# Patient Record
Sex: Female | Born: 1945
Health system: Southern US, Community
[De-identification: ages and names within clinical notes are randomized; demographics above are authoritative.]

## PROBLEM LIST (undated history)

## (undated) DIAGNOSIS — K5790 Diverticulosis of intestine, part unspecified, without perforation or abscess without bleeding: Secondary | ICD-10-CM

## (undated) DIAGNOSIS — J45909 Unspecified asthma, uncomplicated: Secondary | ICD-10-CM

## (undated) DIAGNOSIS — I10 Essential (primary) hypertension: Secondary | ICD-10-CM

## (undated) DIAGNOSIS — K219 Gastro-esophageal reflux disease without esophagitis: Secondary | ICD-10-CM

## (undated) DIAGNOSIS — C50919 Malignant neoplasm of unspecified site of unspecified female breast: Secondary | ICD-10-CM

## (undated) DIAGNOSIS — R61 Generalized hyperhidrosis: Secondary | ICD-10-CM

## (undated) DIAGNOSIS — G4733 Obstructive sleep apnea (adult) (pediatric): Secondary | ICD-10-CM

## (undated) DIAGNOSIS — K642 Third degree hemorrhoids: Principal | ICD-10-CM

## (undated) DIAGNOSIS — K649 Unspecified hemorrhoids: Secondary | ICD-10-CM

## (undated) DIAGNOSIS — Z803 Family history of malignant neoplasm of breast: Secondary | ICD-10-CM

## (undated) HISTORY — DX: Obstructive sleep apnea (adult) (pediatric): G47.33

## (undated) HISTORY — DX: Third degree hemorrhoids: K64.2

## (undated) HISTORY — PX: NASAL SEPTUM SURGERY: SHX37

## (undated) HISTORY — DX: Family history of malignant neoplasm of breast: Z80.3

## (undated) HISTORY — DX: Malignant neoplasm of unspecified site of unspecified female breast: C50.919

## (undated) HISTORY — PX: THYROGLOSSAL DUCT CYST: SHX297

## (undated) HISTORY — DX: Gastro-esophageal reflux disease without esophagitis: K21.9

## (undated) HISTORY — DX: Diverticulosis of intestine, part unspecified, without perforation or abscess without bleeding: K57.90

## (undated) HISTORY — DX: Generalized hyperhidrosis: R61

## (undated) HISTORY — DX: Unspecified asthma, uncomplicated: J45.909

## (undated) HISTORY — PX: ABDOMINAL HYSTERECTOMY: SHX81

## (undated) HISTORY — DX: Unspecified hemorrhoids: K64.9

## (undated) HISTORY — DX: Essential (primary) hypertension: I10

---

## 1999-07-07 ENCOUNTER — Encounter: Payer: Self-pay | Admitting: Internal Medicine

## 1999-07-07 ENCOUNTER — Encounter: Admission: RE | Admit: 1999-07-07 | Discharge: 1999-07-07 | Payer: Self-pay | Admitting: Internal Medicine

## 2000-02-11 ENCOUNTER — Encounter: Payer: Self-pay | Admitting: Gynecology

## 2000-02-11 ENCOUNTER — Ambulatory Visit (HOSPITAL_COMMUNITY): Admission: RE | Admit: 2000-02-11 | Discharge: 2000-02-11 | Payer: Self-pay | Admitting: Gynecology

## 2000-09-08 ENCOUNTER — Other Ambulatory Visit: Admission: RE | Admit: 2000-09-08 | Discharge: 2000-09-08 | Payer: Self-pay | Admitting: Gynecology

## 2001-04-19 ENCOUNTER — Ambulatory Visit (HOSPITAL_COMMUNITY): Admission: RE | Admit: 2001-04-19 | Discharge: 2001-04-19 | Payer: Self-pay

## 2001-04-19 ENCOUNTER — Encounter: Payer: Self-pay | Admitting: Gynecology

## 2002-07-31 ENCOUNTER — Encounter: Payer: Self-pay | Admitting: Gynecology

## 2002-07-31 ENCOUNTER — Ambulatory Visit (HOSPITAL_COMMUNITY): Admission: RE | Admit: 2002-07-31 | Discharge: 2002-07-31 | Payer: Self-pay | Admitting: Gynecology

## 2002-09-17 ENCOUNTER — Encounter: Payer: Self-pay | Admitting: Internal Medicine

## 2002-09-17 ENCOUNTER — Encounter: Admission: RE | Admit: 2002-09-17 | Discharge: 2002-09-17 | Payer: Self-pay | Admitting: Internal Medicine

## 2003-08-06 ENCOUNTER — Ambulatory Visit (HOSPITAL_COMMUNITY): Admission: RE | Admit: 2003-08-06 | Discharge: 2003-08-06 | Payer: Self-pay | Admitting: Gynecology

## 2003-11-21 ENCOUNTER — Other Ambulatory Visit: Admission: RE | Admit: 2003-11-21 | Discharge: 2003-11-21 | Payer: Self-pay | Admitting: Gynecology

## 2004-08-11 ENCOUNTER — Ambulatory Visit (HOSPITAL_COMMUNITY): Admission: RE | Admit: 2004-08-11 | Discharge: 2004-08-11 | Payer: Self-pay | Admitting: Gynecology

## 2004-08-27 ENCOUNTER — Encounter: Admission: RE | Admit: 2004-08-27 | Discharge: 2004-08-27 | Payer: Self-pay | Admitting: Gynecology

## 2004-09-04 ENCOUNTER — Ambulatory Visit: Payer: Self-pay | Admitting: Internal Medicine

## 2004-09-09 ENCOUNTER — Ambulatory Visit: Payer: Self-pay | Admitting: Internal Medicine

## 2005-08-16 ENCOUNTER — Encounter: Admission: RE | Admit: 2005-08-16 | Discharge: 2005-08-16 | Payer: Self-pay | Admitting: Gynecology

## 2006-08-18 ENCOUNTER — Encounter: Admission: RE | Admit: 2006-08-18 | Discharge: 2006-08-18 | Payer: Self-pay | Admitting: Gynecology

## 2006-11-21 ENCOUNTER — Encounter: Payer: Self-pay | Admitting: Internal Medicine

## 2006-12-05 ENCOUNTER — Ambulatory Visit: Payer: Self-pay | Admitting: Internal Medicine

## 2006-12-14 ENCOUNTER — Encounter: Payer: Self-pay | Admitting: Internal Medicine

## 2006-12-15 ENCOUNTER — Ambulatory Visit: Payer: Self-pay | Admitting: Internal Medicine

## 2006-12-15 ENCOUNTER — Encounter: Payer: Self-pay | Admitting: Internal Medicine

## 2007-01-25 ENCOUNTER — Ambulatory Visit: Payer: Self-pay | Admitting: Internal Medicine

## 2007-01-25 DIAGNOSIS — K5730 Diverticulosis of large intestine without perforation or abscess without bleeding: Secondary | ICD-10-CM | POA: Insufficient documentation

## 2007-01-25 DIAGNOSIS — R1310 Dysphagia, unspecified: Secondary | ICD-10-CM

## 2007-01-27 ENCOUNTER — Encounter (INDEPENDENT_AMBULATORY_CARE_PROVIDER_SITE_OTHER): Payer: Self-pay | Admitting: *Deleted

## 2007-01-27 LAB — CONVERTED CEMR LAB
ALT: 28 units/L (ref 0–35)
Basophils Relative: 0.4 % (ref 0.0–1.0)
Bilirubin, Direct: 0.1 mg/dL (ref 0.0–0.3)
CO2: 30 meq/L (ref 19–32)
Eosinophils Relative: 2.4 % (ref 0.0–5.0)
GFR calc Af Amer: 130 mL/min
Glucose, Bld: 95 mg/dL (ref 70–99)
Hemoglobin: 13.3 g/dL (ref 12.0–15.0)
Lymphocytes Relative: 29.7 % (ref 12.0–46.0)
Monocytes Absolute: 0.4 10*3/uL (ref 0.2–0.7)
Neutro Abs: 2.3 10*3/uL (ref 1.4–7.7)
Neutrophils Relative %: 58.3 % (ref 43.0–77.0)
Potassium: 4 meq/L (ref 3.5–5.1)
Sodium: 143 meq/L (ref 135–145)
TSH: 1.62 microintl units/mL (ref 0.35–5.50)
Total Protein: 7 g/dL (ref 6.0–8.3)
VLDL: 21 mg/dL (ref 0–40)
WBC: 4 10*3/uL — ABNORMAL LOW (ref 4.5–10.5)

## 2007-07-18 ENCOUNTER — Ambulatory Visit: Payer: Self-pay | Admitting: Internal Medicine

## 2007-07-27 ENCOUNTER — Encounter (INDEPENDENT_AMBULATORY_CARE_PROVIDER_SITE_OTHER): Payer: Self-pay | Admitting: *Deleted

## 2007-08-23 ENCOUNTER — Ambulatory Visit (HOSPITAL_COMMUNITY): Admission: RE | Admit: 2007-08-23 | Discharge: 2007-08-23 | Payer: Self-pay | Admitting: Gynecology

## 2007-09-06 ENCOUNTER — Encounter: Admission: RE | Admit: 2007-09-06 | Discharge: 2007-09-06 | Payer: Self-pay | Admitting: Gynecology

## 2007-10-31 ENCOUNTER — Encounter (INDEPENDENT_AMBULATORY_CARE_PROVIDER_SITE_OTHER): Payer: Self-pay | Admitting: Radiology

## 2007-10-31 ENCOUNTER — Encounter: Admission: RE | Admit: 2007-10-31 | Discharge: 2007-10-31 | Payer: Self-pay | Admitting: Gynecology

## 2008-09-03 ENCOUNTER — Encounter: Admission: RE | Admit: 2008-09-03 | Discharge: 2008-09-03 | Payer: Self-pay | Admitting: Gynecology

## 2008-09-25 ENCOUNTER — Telehealth (INDEPENDENT_AMBULATORY_CARE_PROVIDER_SITE_OTHER): Payer: Self-pay | Admitting: *Deleted

## 2008-09-25 ENCOUNTER — Ambulatory Visit: Payer: Self-pay | Admitting: Internal Medicine

## 2009-04-30 ENCOUNTER — Telehealth: Payer: Self-pay | Admitting: Internal Medicine

## 2009-09-23 ENCOUNTER — Encounter: Admission: RE | Admit: 2009-09-23 | Discharge: 2009-09-23 | Payer: Self-pay | Admitting: Gynecology

## 2010-01-14 ENCOUNTER — Ambulatory Visit: Payer: Self-pay | Admitting: Internal Medicine

## 2010-01-14 DIAGNOSIS — R198 Other specified symptoms and signs involving the digestive system and abdomen: Secondary | ICD-10-CM

## 2010-01-14 DIAGNOSIS — K3189 Other diseases of stomach and duodenum: Secondary | ICD-10-CM

## 2010-01-14 DIAGNOSIS — R1013 Epigastric pain: Secondary | ICD-10-CM

## 2010-01-14 DIAGNOSIS — N959 Unspecified menopausal and perimenopausal disorder: Secondary | ICD-10-CM | POA: Insufficient documentation

## 2010-01-14 DIAGNOSIS — Z860100 Personal history of colon polyps, unspecified: Secondary | ICD-10-CM | POA: Insufficient documentation

## 2010-01-15 LAB — CONVERTED CEMR LAB
Albumin: 4.2 g/dL (ref 3.5–5.2)
Basophils Relative: 1.3 % (ref 0.0–3.0)
CO2: 28 meq/L (ref 19–32)
Chloride: 107 meq/L (ref 96–112)
Cholesterol: 216 mg/dL — ABNORMAL HIGH (ref 0–200)
Creatinine, Ser: 0.6 mg/dL (ref 0.4–1.2)
Direct LDL: 142.9 mg/dL
Eosinophils Absolute: 0.1 10*3/uL (ref 0.0–0.7)
Hemoglobin: 13.4 g/dL (ref 12.0–15.0)
Lymphs Abs: 1.2 10*3/uL (ref 0.7–4.0)
MCHC: 34 g/dL (ref 30.0–36.0)
MCV: 95 fL (ref 78.0–100.0)
Monocytes Absolute: 0.3 10*3/uL (ref 0.1–1.0)
Neutro Abs: 3.1 10*3/uL (ref 1.4–7.7)
Neutrophils Relative %: 65.3 % (ref 43.0–77.0)
RBC: 4.14 M/uL (ref 3.87–5.11)
TSH: 1.3 microintl units/mL (ref 0.35–5.50)
Total CHOL/HDL Ratio: 3
Total Protein: 7.1 g/dL (ref 6.0–8.3)

## 2010-01-20 ENCOUNTER — Encounter: Payer: Self-pay | Admitting: Internal Medicine

## 2010-02-09 ENCOUNTER — Encounter (INDEPENDENT_AMBULATORY_CARE_PROVIDER_SITE_OTHER): Payer: Self-pay | Admitting: *Deleted

## 2010-02-09 ENCOUNTER — Ambulatory Visit: Payer: Self-pay | Admitting: Internal Medicine

## 2010-02-09 LAB — CONVERTED CEMR LAB: OCCULT 1: NEGATIVE

## 2010-04-09 ENCOUNTER — Telehealth: Payer: Self-pay | Admitting: Internal Medicine

## 2010-05-24 ENCOUNTER — Encounter: Payer: Self-pay | Admitting: Gynecology

## 2010-06-04 NOTE — Letter (Signed)
Summary: Cancer Screening/[facility] Personalized Risk Profile  Cancer Screening/[facility] Personalized Risk Profile   Imported By: Lanelle Bal 01/22/2010 09:36:13  _____________________________________________________________________  External Attachment:    Type:   Image     Comment:   External Document

## 2010-06-04 NOTE — Assessment & Plan Note (Signed)
Summary: cpx - lab/cbs   Vital Signs:  Patient profile:   65 year old female Height:      57.5 inches Weight:      118.6 pounds BMI:     25.31 Temp:     98.3 degrees F oral Pulse rate:   71 / minute Resp:     14 per minute BP sitting:   130 / 85  (left arm) Cuff size:   large  Vitals Entered By: Shonna Chock CMA (January 14, 2010 8:36 AM)   History of Present Illness: Courtney [name] is here for a Medicare AMV.She describes  dyspepsia & loose stool in mornings as  itermittent issues over past 2 months.  As needed  Prevacid controls the dyspepsia. Symptoms worse when traveling.                            1.Risk factors based on Past M, S, F history:see symptoms above ; chart updated 2.Physical Activities: see Social history 3.Depression/mood: no issues 4.Hearing: whisper heard @ 6 ft 5.ADL's: no limitations 6.Fall Risk: no issues  7.Home Safety: no issues 8.Height, weight, &visual acuity:wall chart read @ 6 ft( OD lens worn) 9.Counseling: none requested ; POA in place  10.Labs ordered based on risk factors: see Orders 11. Referral Coordination: none requested  12.  Care Plan: see Instructions  13. Cognitive Assessment: oreinted X 3 ; memory & recall intact   ; "WORLD" spelled backwards ; mood & affect normal.   Preventive Screening-Counseling & Management  Alcohol-Tobacco     Alcohol drinks/day: 2-3     Alcohol Counseling: not indicated; use of alcohol is not excessive or problematic     Smoking Status: quit     Year Quit: 1968  Caffeine-Diet-Exercise     Caffeine use/day: < 1/ day     Does Patient Exercise: yes  Hep-HIV-STD-Contraception     Dental Visit-last 6 months yes     Sun Exposure-Excessive: no  Safety-Violence-Falls     Seat Belt Use: yes     Firearms in the Home: firearms in the home     Firearm Counseling: not indicated; uses recommended firearm safety measures      Sexual History:  currently monogamous.        Blood Transfusions:  no.        Travel  History:  none in past 12 mos.    Current Medications (verified): 1)  Finacea 15 %  Gel (Azelaic Acid) .... Apply As Directed 2)  Premarin 0.45 Mg  Tabs (Estrogens Conjugated) .Marland Kitchen.. 1 By Mouth Qd 3)  Calcium 500mg  .... 1 By Mouth Qd 4)  Multivitamin .... 2 Tabs Qd 5)  Omega 3 6)  Euthero 7)  Gingko 8)  Horse Chestnut 9)  Vit B6 10)  Vit D 11)  Coq10 12)  Asa 81mg   Allergies (verified): No Known Drug Allergies  Past History:  Past Medical History: G 2 P2 Diverticulosis, colon NMR Lipoprofile 2009: LDL 109 (1239/424), HDL 65, TG 64. LDL ideal goal = < 120. Colonic polyps, hx of 2008  Past Surgical History: Colon polypectomy 2008, Dr Juanda Chance, due 2013 Hysterectomy & BSO 1995 for fibroids Septoplasty 1976; thyroglossal duct cystectomy  Family History: Father: CAD,diet controlled carbohydrate intolerance Mother: HTN, cancer  breast & colon Siblings: sister malignancy of  appendix; P uncles ? premature CAD  Social History: no specific  diet Retired Married Former Smoker Alcohol use-yes: socially Regular exercise-yes: water aerobics  3-4X/week, walking 3 mpd 3X/ week Caffeine use/day:  < 1/ day Dental Care w/in 6 mos.:  yes Sun Exposure-Excessive:  no Seat Belt Use:  yes Sexual History:  currently monogamous Blood Transfusions:  no  Review of Systems  The patient denies anorexia, weight loss, weight gain, vision loss, decreased hearing, hoarseness, chest pain, syncope, dyspnea on exertion, peripheral edema, prolonged cough, headaches, abdominal pain, melena, hematochezia, hematuria, suspicious skin lesions, depression, unusual weight change, abnormal bleeding, enlarged lymph nodes, and angioedema.         Intermittent pill (vitamins) & food ("dry" ) dysphagia.  Physical Exam  General:  well-nourished; alert,appropriate and cooperative throughout examination Head:  Normocephalic and atraumatic without obvious abnormalities. Eyes:  No corneal or conjunctival  inflammation noted. Perrla. Funduscopic exam benign, without hemorrhages, exudates or papilledema. No icterus Ears:  External ear exam shows no significant lesions or deformities.  Otoscopic examination reveals clear canals, tympanic membranes are intact bilaterally without bulging, retraction, inflammation or discharge. Hearing is grossly normal bilaterally. Nose:  External nasal examination shows no deformity or inflammation. Nasal mucosa are pink and moist without lesions or exudates. Mouth:  Oral mucosa and oropharynx without lesions or exudates.  Teeth in good repair. No pharyngeal erythema.   Neck:  No deformities, masses, or tenderness noted. Lungs:  Normal respiratory effort, chest expands symmetrically. Lungs are clear to auscultation, no crackles or wheezes. Heart:  normal rate, regular rhythm, no gallop, no rub, no JVD, no HJR, and grade 1/2  /6 systolic murmur.   Abdomen:  Bowel sounds positive,abdomen soft and non-tender without masses, organomegaly or hernias noted. Genitalia:  Dr Greta Doom Msk:  No deformity or scoliosis noted of thoracic or lumbar spine.   Pulses:  R and L carotid,radial,dorsalis pedis and posterior tibial pulses are full and equal bilaterally Extremities:  No clubbing, cyanosis, edema, or deformity noted with normal full range of motion of all joints.   Neurologic:  alert & oriented X3 and DTRs symmetrical and normal.   Skin:  Intact without suspicious lesions or rashes Cervical Nodes:  No lymphadenopathy noted Axillary Nodes:  No palpable lymphadenopathy Psych:  memory intact for recent and remote, normally interactive, and good eye contact.     Impression & Recommendations:  Problem # 1:  PREVENTIVE HEALTH CARE (ICD-V70.0)  Orders: [facility] -Subsequent Annual Wellness Visit [phone]) Venipuncture (03474) TLB-Lipid Panel (80061-LIPID) TLB-BMP (Basic Metabolic Panel-BMET) (80048-METABOL) Specimen Handling (25956)  Problem # 2:  DYSPEPSIA (ICD-536.8)  Orders: EKG  w/ Interpretation (93000) Venipuncture (38756) TLB-CBC Platelet - w/Differential (85025-CBCD) Specimen Handling (43329)  Problem # 3:  CHANGE IN BOWELS (ICD-787.99)  Orders: Venipuncture (51884) TLB-Hepatic/Liver Function Pnl (80076-HEPATIC) TLB-TSH (Thyroid Stimulating Hormone) (84443-TSH) Specimen Handling (16606)  Problem # 4:  COLONIC POLYPS, HX OF (ICD-V12.72)  as per Dr Juanda Chance  Orders: Venipuncture (30160) TLB-CBC Platelet - w/Differential (85025-CBCD) Specimen Handling (10932)  Problem # 5:  POSTMENOPAUSAL SYNDROME (ICD-627.9)  Her updated medication list for this problem includes:    Premarin 0.45 Mg Tabs (Estrogens conjugated) .Marland Kitchen... 1 by mouth qd  Orders: Radiology Referral (Radiology)  Complete Medication List: 1)  Finacea 15 % Gel (Azelaic acid) .... Apply as directed 2)  Premarin 0.45 Mg Tabs (Estrogens conjugated) .Marland Kitchen.. 1 by mouth qd 3)  Calcium 500mg   .... 1 by mouth qd 4)  Multivitamin  .... 2 tabs qd 5)  Omega 3  6)  Euthero  7)  Gingko  8)  Horse Chestnut  9)  Vit B6  10)  Vit D  11)  Coq10  12)  Asa 81mg   13)  Ranitidine Hcl 150 Mg Tabs (Ranitidine hcl) .Marland Kitchen.. 1 two times a day pre meals  Other Orders: Zoster (Shingles) Vaccine Live [phone]) Admin 1st Vaccine (98119)  Patient Instructions: 1)  Avoid foods high in acid (tomatoes, citrus juices, spicy foods). Avoid eating within two hours of lying down or before exercising. Do not over eat; try smaller more frequent meals. Elevate head of bed twelve inches when sleeping.Complete stool cards. Take Align once daily for loose stools. 2)  Prepare a Living Will if not place . Prescriptions: RANITIDINE HCL 150 MG TABS (RANITIDINE HCL) 1 two times a day pre meals  #180 x 0   Entered and Authorized by:   [name] MD   Signed by:   [name] MD on 01/14/2010   Method used:   Print then Give to Patient   RxID:   [id number]    Immunizations Administered:  Zostavax # 1:    Vaccine Type:  Zostavax    Site: Right Arm    Mfr: Merck    Dose: 0.28mL    Route: [place]    Given by: Shonna Chock CMA    Exp. Date: 12/19/2010    Lot #: [id number]    VIS given: 02/12/05 given January 14, 2010.

## 2010-06-04 NOTE — Progress Notes (Signed)
Summary: Ranitidine  Phone Note Call from Patient Call back at Home Phone [phone]   Summary of Call: Patient left message on triage that she has been trying the Ranitidine and it is not working. She would like to know if there is something else she can try or does she need to come in. Please advise. Initial call taken by: Lucious Groves CMA,  April 09, 2010 4:03 PM  Follow-up for Phone Call        Per MD the patient can try:  Omeprazole 20mg  #60 1 by mouth two times a day before breakfast and evening meal. Follow-up by: Lucious Groves CMA,  April 09, 2010 4:18 PM    New/Updated Medications: OMEPRAZOLE 20 MG CPDR (OMEPRAZOLE) 1 by mouth two times a day before breakfast and evening meal Prescriptions: OMEPRAZOLE 20 MG CPDR (OMEPRAZOLE) 1 by mouth two times a day before breakfast and evening meal  #60 x 0   Entered by:   Lucious Groves CMA   Authorized by:   [name] MD   Signed by:   Lucious Groves CMA on 04/09/2010   Method used:   Electronically to        [id number] (retail)       [address]       [place], [place]  [address]       Ph: [phone]       Fax: [phone]   RxID:   [id number]

## 2010-06-04 NOTE — Letter (Signed)
Summary: Results Follow up Letter  [facility] [place]  [address] [place], [place] [address]   Phone: [phone]  Fax: [phone]    02/09/2010 MRN: [id number]  [facility] [address] [place], [place]  [address]  Dear Courtney [name],  The following are the results of your recent test(s):  Test         Result    Pap Smear:        Normal _____  Not Normal _____ Comments: ______________________________________________________ Cholesterol: LDL(Bad cholesterol):         Your goal is less than:         HDL (Good cholesterol):       Your goal is more than: Comments:  ______________________________________________________ Mammogram:        Normal _____  Not Normal _____ Comments:  ___________________________________________________________________ Hemoccult:        Normal _X____  Not normal _______ Comments:    _____________________________________________________________________ Other Tests:    We routinely do not discuss normal results over the telephone.  If you desire a copy of the results, or you have any questions about this information we can discuss them at your next office visit.   Sincerely,

## 2010-10-21 ENCOUNTER — Other Ambulatory Visit: Payer: Self-pay | Admitting: Gynecology

## 2010-10-21 DIAGNOSIS — Z1231 Encounter for screening mammogram for malignant neoplasm of breast: Secondary | ICD-10-CM

## 2010-11-01 DIAGNOSIS — C50919 Malignant neoplasm of unspecified site of unspecified female breast: Secondary | ICD-10-CM

## 2010-11-01 HISTORY — DX: Malignant neoplasm of unspecified site of unspecified female breast: C50.919

## 2010-11-02 ENCOUNTER — Ambulatory Visit
Admission: RE | Admit: 2010-11-02 | Discharge: 2010-11-02 | Disposition: A | Discharge status: Home / Self Care | Payer: Medicare Other | Source: Ambulatory Visit | Attending: Gynecology | Admitting: Gynecology

## 2010-11-02 DIAGNOSIS — Z1231 Encounter for screening mammogram for malignant neoplasm of breast: Secondary | ICD-10-CM

## 2010-11-06 ENCOUNTER — Other Ambulatory Visit: Payer: Self-pay | Admitting: Gynecology

## 2010-11-06 DIAGNOSIS — R928 Other abnormal and inconclusive findings on diagnostic imaging of breast: Secondary | ICD-10-CM

## 2010-11-11 ENCOUNTER — Other Ambulatory Visit: Payer: Self-pay | Admitting: Gynecology

## 2010-11-11 ENCOUNTER — Ambulatory Visit
Admission: RE | Admit: 2010-11-11 | Discharge: 2010-11-11 | Disposition: A | Discharge status: Home / Self Care | Payer: Medicare Other | Source: Ambulatory Visit | Attending: Gynecology | Admitting: Gynecology

## 2010-11-11 ENCOUNTER — Other Ambulatory Visit: Payer: Self-pay | Admitting: Diagnostic Radiology

## 2010-11-11 DIAGNOSIS — R928 Other abnormal and inconclusive findings on diagnostic imaging of breast: Secondary | ICD-10-CM

## 2010-11-12 ENCOUNTER — Other Ambulatory Visit: Payer: Self-pay | Admitting: Gynecology

## 2010-11-12 DIAGNOSIS — C50912 Malignant neoplasm of unspecified site of left female breast: Secondary | ICD-10-CM

## 2010-11-17 ENCOUNTER — Ambulatory Visit
Admission: RE | Admit: 2010-11-17 | Discharge: 2010-11-17 | Disposition: A | Discharge status: Home / Self Care | Payer: Medicare Other | Source: Ambulatory Visit | Attending: Gynecology | Admitting: Gynecology

## 2010-11-17 DIAGNOSIS — C50912 Malignant neoplasm of unspecified site of left female breast: Secondary | ICD-10-CM

## 2010-11-17 MED ORDER — GADOBENATE DIMEGLUMINE 529 MG/ML IV SOLN
10.0000 mL | Freq: Once | INTRAVENOUS | Status: AC | PRN
  Administered 2010-11-17: 10 mL via INTRAVENOUS

## 2010-11-18 ENCOUNTER — Encounter (HOSPITAL_BASED_OUTPATIENT_CLINIC_OR_DEPARTMENT_OTHER): Payer: Medicare Other | Admitting: Oncology

## 2010-11-18 ENCOUNTER — Ambulatory Visit (HOSPITAL_BASED_OUTPATIENT_CLINIC_OR_DEPARTMENT_OTHER): Payer: Medicare Other | Admitting: General Surgery

## 2010-11-18 ENCOUNTER — Encounter (INDEPENDENT_AMBULATORY_CARE_PROVIDER_SITE_OTHER): Payer: Self-pay | Admitting: General Surgery

## 2010-11-18 ENCOUNTER — Other Ambulatory Visit: Payer: Self-pay | Admitting: Oncology

## 2010-11-18 ENCOUNTER — Ambulatory Visit
Admission: RE | Admit: 2010-11-18 | Discharge: 2010-11-18 | Disposition: A | Discharge status: Home / Self Care | Payer: Medicare Other | Source: Ambulatory Visit | Attending: Gynecology | Admitting: Gynecology

## 2010-11-18 ENCOUNTER — Other Ambulatory Visit: Payer: Self-pay | Admitting: Gynecology

## 2010-11-18 VITALS — BP 178/86 | HR 74 | Temp 98.3°F | Resp 20 | Ht <= 58 in | Wt 118.0 lb

## 2010-11-18 DIAGNOSIS — C50419 Malignant neoplasm of upper-outer quadrant of unspecified female breast: Secondary | ICD-10-CM

## 2010-11-18 DIAGNOSIS — R928 Other abnormal and inconclusive findings on diagnostic imaging of breast: Secondary | ICD-10-CM

## 2010-11-18 DIAGNOSIS — C50919 Malignant neoplasm of unspecified site of unspecified female breast: Secondary | ICD-10-CM

## 2010-11-18 DIAGNOSIS — D0590 Unspecified type of carcinoma in situ of unspecified breast: Secondary | ICD-10-CM

## 2010-11-18 DIAGNOSIS — C50912 Malignant neoplasm of unspecified site of left female breast: Secondary | ICD-10-CM

## 2010-11-18 LAB — CBC WITH DIFFERENTIAL/PLATELET
BASO%: 0.6 % (ref 0.0–2.0)
LYMPH%: 30.6 % (ref 14.0–49.7)
MCHC: 34.1 g/dL (ref 31.5–36.0)
MCV: 92.4 fL (ref 79.5–101.0)
MONO%: 6.1 % (ref 0.0–14.0)
NEUT%: 60.7 % (ref 38.4–76.8)
Platelets: 190 10*3/uL (ref 145–400)
RBC: 4.19 10*6/uL (ref 3.70–5.45)

## 2010-11-18 LAB — COMPREHENSIVE METABOLIC PANEL
ALT: 30 U/L (ref 0–35)
Albumin: 4 g/dL (ref 3.5–5.2)
CO2: 29 mEq/L (ref 19–32)
Calcium: 9.5 mg/dL (ref 8.4–10.5)
Chloride: 102 mEq/L (ref 96–112)
Glucose, Bld: 111 mg/dL — ABNORMAL HIGH (ref 70–99)
Potassium: 4 mEq/L (ref 3.5–5.3)
Sodium: 139 mEq/L (ref 135–145)
Total Bilirubin: 0.6 mg/dL (ref 0.3–1.2)
Total Protein: 7.3 g/dL (ref 6.0–8.3)

## 2010-11-18 LAB — CANCER ANTIGEN 27.29: CA 27.29: 19 U/mL (ref 0–39)

## 2010-11-18 NOTE — Patient Instructions (Signed)
You will be scheduled for additional image guided biopsies, one biopsy on the left breast and another biopsy on the right breast. These are scheduled for Friday, 11/20/2010. You will return to see Dr. Derrell Lolling in his office on 11/30/2010 to discuss results of the biopsies and to discuss planning for definitive surgery.the cancer center will arrange for counseling for you. Dr. Lennette Bihari will arrange follow up appointment to discussion in the testing results and prescription for antiestrogen oral therapy.

## 2010-11-18 NOTE — Progress Notes (Addendum)
Subjective:     Patient ID: Courtney [name], female   DOB: 11-14-1945, 65 y.o.   MRN: [id number]  HPI This is a 65 year old Caucasian female, referred to the breast multidisciplinary clinic by Dr. Cain Saupe at the breast center of [place]. 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  The patient was not aware that she had a breast problem. She went for screening mammograms which showed a focal density in the lateral aspect of the left breast 4 cm from the nipple. The right breast looked normal. Ultrasound measured the left breast mass at 1.8 x 1.5 x 1.3 cm. The left axilla looked normal by ultrasound.  Image guided biopsy of the left breast mass was performed and reveals invasive ductal carcinoma and ductal carcinoma in situ, receptor positive, HER-2/neu negative, Ki67 = 5%.  Subsequent MRI shows multiple abnormalities. In the left breast laterally there is a 2.2 cm area of enhancement consistent with a biopsy-proven cancer. In the left breast centrally and superiorly there is a 6 mm nodule that enhances  somewhat. In the right breast there is a 1.2 cm area of enhancement in the upper outer quadrant. This has been discussed in breast conference this morning. We have   confirmed the anatomic location of these 3 areas. The 2 areas that have not been biopsied are felt to be sufficiently suspicious to warrant  biopsy if the patient desires breast conservation surgery.  The patient is being evaluated in the breast clinic today by Dr. Welton Flakes, Dr. Dayton Scrape,  and me.   Review of Systems  Constitutional: Negative.   HENT: Negative.   Eyes: Negative.   Respiratory: Positive for cough. Negative for apnea, choking, chest tightness, shortness of breath, wheezing and stridor.   Cardiovascular: Negative.   Gastrointestinal: Negative.   Genitourinary: Negative.   Musculoskeletal: Negative.   Neurological: Negative.   Hematological:  Negative.   Psychiatric/Behavioral: Negative.        Past Medical History  Diagnosis Date  . Fatigue   . Cough   . GERD (gastroesophageal reflux disease)   . Lump in female breast    Current outpatient prescriptions:aspirin 162 MG EC tablet, Take 162 mg by mouth daily.  , Disp: , Rfl: ;  Azelaic Acid (FINACEA) 15 % cream, Apply topically 2 (two) times daily. After skin is thoroughly washed and patted dry, gently but thoroughly massage a thin film of azelaic acid cream into the affected area twice daily, in the morning and evening. , Disp: , Rfl:  calcium carbonate (TUMS - DOSED IN MG ELEMENTAL CALCIUM) 500 MG chewable tablet, Chew 1 tablet by mouth daily.  , Disp: , Rfl: ;  calcium-vitamin D (OSCAL WITH D) 500-200 MG-UNIT per tablet, Take 4 tablets by mouth daily.  , Disp: , Rfl: ;  co-enzyme Q-10 30 MG capsule, Take 50 mg by mouth 3 (three) times daily.  , Disp: , Rfl: ;  Ginseng (ELEUTHERO PO), Take 410 mg by mouth daily.  , Disp: , Rfl:  HORSE CHESTNUT PO, Take 250 mg by mouth daily.  , Disp: , Rfl: ;  milk thistle 175 MG tablet, Take 175 mg by mouth daily.  , Disp: , Rfl: ;  Multiple Vitamins-Minerals (MULTIVITAMIN WITH MINERALS) tablet, Take 2 tablets by mouth daily.  , Disp: , Rfl: ;  OMEGA 3 1000 MG CAPS, Take 1,000 mg by mouth daily.  , Disp: , Rfl: ;  omeprazole (PRILOSEC) 20 MG  capsule, , Disp: , Rfl: ;  PREMARIN 0.45 MG tablet, , Disp: , Rfl:  Probiotic Product (PROBIOTIC FORMULA PO), Take by mouth.  , Disp: , Rfl: ;  Pyridoxine HCl (VITAMIN B-6) 500 MG tablet, Take 500 mg by mouth daily.  , Disp: , Rfl:  No current facility-administered medications for this visit. Facility-Administered Medications Ordered in Other Visits: gadobenate dimeglumine (MULTIHANCE) injection 10 mL, 10 mL, Intravenous, Once PRN, Medication Radiologist, 10 mL at 11/17/10 1250  Not on File No known drug allergies.  History reviewed. No pertinent family history. Mother had breast cancer. Mother had colon  cancer. Mother had vaginal cancer. A maternal cousin died of breast cancer.  History  Substance Use Topics  . Smoking status: Never Smoker   . Smokeless tobacco: Not on file  . Alcohol Use: 1.8 oz/week    3 Glasses of wine per week     Objective:   Physical Exam  Constitutional: She is oriented to person, place, and time. She appears well-developed and well-nourished. No distress.  HENT:  Head: Normocephalic and atraumatic.  Nose: Nose normal.  Mouth/Throat: No oropharyngeal exudate.  Eyes: Conjunctivae are normal. Pupils are equal, round, and reactive to light. Left eye exhibits discharge. No scleral icterus.  Neck: Normal range of motion. Neck supple. No tracheal deviation present. No thyromegaly present.  Cardiovascular: Normal rate, regular rhythm, normal heart sounds and intact distal pulses.  Exam reveals no gallop.   No murmur heard. Pulmonary/Chest: Effort normal and breath sounds normal. No respiratory distress. She has no wheezes. She has no rales.    Abdominal: Soft. Bowel sounds are normal. She exhibits no distension. There is no tenderness. There is no rebound and no guarding.  Musculoskeletal: Normal range of motion. She exhibits no edema and no tenderness.  Lymphadenopathy:    She has no cervical adenopathy.  Neurological: She is oriented to person, place, and time. She has normal reflexes. She exhibits normal muscle tone.  Skin: Skin is warm and dry. No rash noted. No erythema. No pallor.  Psychiatric: She has a normal mood and affect. Her behavior is normal. Judgment and thought content normal.   Left breast reveals a bruise in the lateral aspect, but minimal hematoma. There are no other masses in either breast. Nipple and areola complexes looked normal. There is no palpable axillary adenopathy or supraclavicular adenopathy.    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  Abnormal MRI, demonstrating 6 mm nodule at 12:00 position of left breast, and 1.2 cm nodule with enhancement of upper outer quadrant of right breast. Suspicious findings.  Positive family history for breast cancer in mother at age 6.  Status post abdominal hysterectomy and bilateral salpingo-oophorectomy.    Plan:     We had a long discussion about her biopsy proven cancer, as well as the suspicious areas of enhancement seen on the MRI. She is interested in considering breast conservation surgery, if that is possible. She and her husband were told that she will need further biopsies to clearly define the extent of her disease prior to any decision making.  She will be scheduled for ultrasound and possible MRI guided biopsy of bilateral breast densities this coming Friday, July 20.  Scheduled to see me back in my office on July 30. This office visit is delayed because she has a vacation scheduled next week.  She is instructed to discontinue use of Premarin.  Genetic counseling and genetic testing will be arranged by Dr. Welton Flakes.  It is felt that she will most likely be advised to receive aromatase inhibitors postop.  She is aware that she will need radiation therapy if she chooses breast conservation surgery.    ADDENDUM: I got a phone call from Dr. Anselmo Pickler at the breast center of [facility] on Tuesday, July 31. The patient has had bilateral MRI guided biopsies.  The initial index cancer in the left breast laterally is known from the initial biopsy  . A second lesion was biopsied in the left breast somewhat medially and superiorly from the index cancer approximate 4 cm away. This also shows malignant breast cancer but they are still deciding whether this is in situ or invasive.  Also biopsied was a centrally located density in the right breast and this shows either invasive ductal cancer or invasive lobular cancer. Further staining is being done.  Dr. Jean Rosenthal is going to call  the patient today to inform her of her  multifocal bilateral breast cancer. The patient is scheduled to see me in the office on Thursday, August 2 to discuss options and definitive surgical plan.

## 2010-11-19 ENCOUNTER — Encounter (INDEPENDENT_AMBULATORY_CARE_PROVIDER_SITE_OTHER): Payer: Self-pay | Admitting: Oncology

## 2010-11-20 ENCOUNTER — Other Ambulatory Visit: Payer: Medicare Other

## 2010-11-20 ENCOUNTER — Telehealth: Payer: Self-pay | Admitting: Internal Medicine

## 2010-11-20 MED ORDER — LORAZEPAM 0.5 MG PO TABS: 0.5000 mg | ORAL_TABLET | Freq: Three times a day (TID) | ORAL | Status: AC

## 2010-11-20 NOTE — Telephone Encounter (Signed)
Left msg on voicemail to return call.

## 2010-11-20 NOTE — Telephone Encounter (Signed)
Lorazepam 0.5 mg one every 6 hours as needed dispense 30 to [facility]

## 2010-11-23 ENCOUNTER — Encounter: Payer: Medicare Other | Admitting: Genetic Counselor

## 2010-11-30 ENCOUNTER — Ambulatory Visit
Admission: RE | Admit: 2010-11-30 | Discharge: 2010-11-30 | Disposition: A | Discharge status: Home / Self Care | Payer: Medicare Other | Source: Ambulatory Visit | Attending: Gynecology | Admitting: Gynecology

## 2010-11-30 ENCOUNTER — Other Ambulatory Visit: Payer: Self-pay | Admitting: Gynecology

## 2010-11-30 ENCOUNTER — Ambulatory Visit
Admission: RE | Admit: 2010-11-30 | Discharge: 2010-11-30 | Disposition: A | Discharge status: Home / Self Care | Payer: Medicare Other | Source: Ambulatory Visit

## 2010-11-30 ENCOUNTER — Encounter: Payer: Medicare Other | Admitting: Genetic Counselor

## 2010-11-30 ENCOUNTER — Encounter (INDEPENDENT_AMBULATORY_CARE_PROVIDER_SITE_OTHER): Payer: Medicare Other | Admitting: General Surgery

## 2010-11-30 DIAGNOSIS — R928 Other abnormal and inconclusive findings on diagnostic imaging of breast: Secondary | ICD-10-CM

## 2010-11-30 MED ORDER — GADOBENATE DIMEGLUMINE 529 MG/ML IV SOLN
10.0000 mL | Freq: Once | INTRAVENOUS | Status: AC | PRN
  Administered 2010-11-30: 10 mL via INTRAVENOUS

## 2010-12-01 ENCOUNTER — Encounter: Payer: Self-pay | Admitting: Internal Medicine

## 2010-12-01 ENCOUNTER — Ambulatory Visit (INDEPENDENT_AMBULATORY_CARE_PROVIDER_SITE_OTHER): Payer: Medicare Other | Admitting: Internal Medicine

## 2010-12-01 VITALS — BP 124/80 | HR 72 | Temp 100.7°F | Wt 116.6 lb

## 2010-12-01 DIAGNOSIS — J209 Acute bronchitis, unspecified: Secondary | ICD-10-CM

## 2010-12-01 MED ORDER — AZITHROMYCIN 250 MG PO TABS: ORAL_TABLET | ORAL | Status: AC

## 2010-12-01 MED ORDER — HYDROCODONE-HOMATROPINE 5-1.5 MG/5ML PO SYRP: 5.0000 mL | ORAL_SOLUTION | Freq: Four times a day (QID) | ORAL | Status: AC | PRN

## 2010-12-01 NOTE — Progress Notes (Signed)
  Subjective:    Patient ID: Courtney [name], female    DOB: [birth year], 65 y.o.   MRN: [id number]  HPI  Cough Onset:7/24 as tickling cough Extrinsic symptoms:itchy eyes, sneezing:no  Infectious symptoms :fever, purulent secretions :no fever @ home (see Temp); yellow sputum since 7/26 Chest symptoms: minor inspiratory  SS pain; minor hemoptysis; no dyspnea; some wheezing @ night GI symptoms: Dyspepsia, reflux: not on PPI Occupational/environmental exposures:no Smoking:quit age19 ACE inhibitor:no Treatment/efficacy:Mucinex DM  &  Max strength; Advil Past medical history/family history pulmonary disease: no    Review of Systems The major and minor symptoms of rhinosinusitis were reviewed.  She denies nasal congestion/obstruction; nasal purulence; facial pain; anosmia; fatigue; halitosis; earache or  dental pain. She has had some ST & frontal headache.    Objective:   Physical Exam General appearance is of good health and nourishment; no acute distress or increased work of breathing is present.  No  lymphadenopathy about the head, neck, or axilla noted.   Eyes: No conjunctival inflammation or lid edema is present.   Ears:  External ear exam shows no significant lesions or deformities.  Otoscopic examination reveals clear canals, tympanic membranes are intact bilaterally without bulging, retraction, inflammation or discharge.  Nose:  External nasal examination shows no deformity or inflammation. Nasal mucosa are pink and moist without lesions or exudates. No septal dislocation or dislocation.No obstruction to airflow.   Oral exam: Dental hygiene is good; lips and gums are healthy appearing.There is no oropharyngeal erythema or exudate noted.  Slightly hoarse  Neck:  No deformities, thyromegaly, masses, or tenderness noted.   Supple with full range of motion without pain.   Heart:  Normal rate and regular rhythm. S1 and S2 normal without gallop, murmur, click, rub or other extra sounds.    Lungs:Chest clear to auscultation; no wheezes, rhonchi,rales ,or rubs present.No increased work of breathing.    Extremities:  No cyanosis, edema, or clubbing  noted    Skin: Warm & dry w/o jaundice or tenting.          Assessment & Plan:  #1 bronchitis, acute with purulent secretions. History suggests some reactive airways component. Criteria for rhinosinusitis are not present.  Plan see orders and recommendations.

## 2010-12-01 NOTE — Patient Instructions (Signed)
Plain Mucinex for thick secretions ;force NON dairy fluids for next 48 hrs. Use a Neti pot daily as needed for sinus congestion Zicam Melts or Zinc lozenges ; vitamin C 2000 mg daily; & Echinacea for 4-7 days. Use  Advair sample as directed.

## 2010-12-03 ENCOUNTER — Ambulatory Visit (INDEPENDENT_AMBULATORY_CARE_PROVIDER_SITE_OTHER): Payer: Medicare Other | Admitting: General Surgery

## 2010-12-03 ENCOUNTER — Encounter (INDEPENDENT_AMBULATORY_CARE_PROVIDER_SITE_OTHER): Payer: Self-pay | Admitting: General Surgery

## 2010-12-03 ENCOUNTER — Telehealth: Payer: Self-pay | Admitting: Internal Medicine

## 2010-12-03 DIAGNOSIS — C50911 Malignant neoplasm of unspecified site of right female breast: Secondary | ICD-10-CM

## 2010-12-03 DIAGNOSIS — C50912 Malignant neoplasm of unspecified site of left female breast: Secondary | ICD-10-CM

## 2010-12-03 DIAGNOSIS — J209 Acute bronchitis, unspecified: Secondary | ICD-10-CM

## 2010-12-03 DIAGNOSIS — C50919 Malignant neoplasm of unspecified site of unspecified female breast: Secondary | ICD-10-CM

## 2010-12-03 MED ORDER — PREDNISONE 20 MG PO TABS: ORAL_TABLET | ORAL | Status: DC

## 2010-12-03 NOTE — Telephone Encounter (Signed)
Patient was seen Tuesday [id number] - patient was told to call back today - still coughing but not as bad

## 2010-12-03 NOTE — Telephone Encounter (Signed)
Left message for pt-- called in Prednisone to the pharmacy- per [facility] Pred 20 mg 1/2 tid w/ meals. #9. Continue using Cough syrup.

## 2010-12-03 NOTE — Progress Notes (Addendum)
Subjective:     Patient ID: Courtney [name], female   DOB: [birth year], 65 y.o.   MRN: [id number]  HPI The patient returns to see me following further biopsies. Her MRI showed the index cancer in the left breast laterally that had already been biopsied. There was also a second enhancing lesion in the left breast medially and superiorly approximately 4 cm away. That was biopsied also shows breast cancer. There was another lesion in the right breast in the upper-outer quadrant which was also biopsied also shows breast cancer. This has been discussed with her by Dr. Jean Rosenthal and by me today.  She and her husband are here today to discuss definitive surgical plans. She states that she has pretty much decided she was bilateral mastectomies and wants to  talk about reconstructive options.  Review of Systems     Objective:   Physical Exam Both breasts are examined. There is some bruising but no palpable masses. No signs of infection.    Assessment:     Cancer of the right breast upper outer quadrant.  Multifocal cancer left breast, superiorly and laterally.  Clinically she is node negative.    Plan:     We had a long discussion about surgical management of breast cancer. We talked about lumpectomy, mastectomy, sentinel node biopsy, axillary node dissection, risks and complications. We talked about reconstructive issues including immediate versus delayed, implant versus tissue transfer. We talked about whether or not she might need radiation therapy in the postop period and the factors influence that.  She wants to plan bilateral total mastectomies and bilateral sentinal node biopsy in the future, but I have advised her to seek plastic surgery consultation preop. We talked about several of the options that might be discussed, but I told her that I would defer that to their expertise for plastic surgery.  After lengthy discussion, she will be referred to Dr. Etter Sjogren for discussion. She is aware  that plastic surgery make might be reluctant to do a TRAM flap or latissimus flap not knowing whether or not the postop radiation therapy would be required. She is aware that they are more likely to offer implant-based therapy as an immediate option in her setting.  I'll wait to hear from her and from Dr. Etter Sjogren as to what the plans are to do proceed with scheduling of her definitive breast surgery.     ADDENDUM-  (January 05, 2011):     [name] had concerns about tissue contouring laterally following her mastectomies. We talked about this and I assured her that Dr. Odis Luster and I would do everything possible to avoid skin redundancy lateral to her mastectomies. Dr. Odis Luster plans tissue expanders. She will also discuss this issue with Dr. Odis Luster in the office tomorrow. Surgery is scheduled for Sept. 12.

## 2010-12-03 NOTE — Telephone Encounter (Signed)
I spoke w/ pt she has been following all of the recommendations that Dr.Hopper gave her. She states that her cough is somewhat better. There is no mucus in cough now, but she is still hoarse. Please advise.

## 2010-12-03 NOTE — Patient Instructions (Signed)
We have made the decision to proceed with bilateral total mastectomies, and bilateral sentinel lymph node mapping and biopsy. We have discussed reconstructive options, and you are going to be referred to Dr. Etter Sjogren for a preoperative consultation regarding options for intervention and a decision as to whether reconstruction would be done in an immediate or delayed fashion. Once we hear back from you and from  Dr. Odis Luster we will schedule  surgery at your convenience.

## 2010-12-04 NOTE — Telephone Encounter (Signed)
I spoke w/ pt she is aware.  

## 2010-12-10 ENCOUNTER — Other Ambulatory Visit: Payer: Self-pay | Admitting: Dermatology

## 2010-12-14 ENCOUNTER — Other Ambulatory Visit (INDEPENDENT_AMBULATORY_CARE_PROVIDER_SITE_OTHER): Payer: Self-pay | Admitting: General Surgery

## 2010-12-14 DIAGNOSIS — C50919 Malignant neoplasm of unspecified site of unspecified female breast: Secondary | ICD-10-CM

## 2010-12-21 ENCOUNTER — Telehealth: Payer: Self-pay | Admitting: Internal Medicine

## 2010-12-21 DIAGNOSIS — J40 Bronchitis, not specified as acute or chronic: Secondary | ICD-10-CM

## 2010-12-21 NOTE — Telephone Encounter (Signed)
Spoke w/ pt says she still has productive cough in the morning doesn't last all day but still there. Finished atb on Aug 10 and is having surgery in 3 weeks and would like to know what she could do to help w/ symptoms.

## 2010-12-21 NOTE — Telephone Encounter (Signed)
Chest film (PA & lat ) needed ; protracted bronchitis

## 2010-12-21 NOTE — Telephone Encounter (Signed)
Spoke w/ pt aware and order placed will have done tomorrow after 2pm

## 2010-12-22 ENCOUNTER — Ambulatory Visit (INDEPENDENT_AMBULATORY_CARE_PROVIDER_SITE_OTHER)
Admission: RE | Admit: 2010-12-22 | Discharge: 2010-12-22 | Disposition: A | Discharge status: Home / Self Care | Payer: Medicare Other | Source: Ambulatory Visit | Attending: Internal Medicine | Admitting: Internal Medicine

## 2010-12-22 DIAGNOSIS — J40 Bronchitis, not specified as acute or chronic: Secondary | ICD-10-CM

## 2010-12-23 ENCOUNTER — Telehealth: Payer: Self-pay | Admitting: Internal Medicine

## 2010-12-23 MED ORDER — METRONIDAZOLE 500 MG PO TABS: 500.0000 mg | ORAL_TABLET | Freq: Three times a day (TID) | ORAL | Status: AC

## 2010-12-23 NOTE — Telephone Encounter (Signed)
Pt called says she received call from Dr. Alwyn Ren was told to inform of [place] of last antibiotic she was on which was levofloxcin. Says she has no fever very productive cough in the mornings that resolves throughout the day. What should she do next.

## 2010-12-23 NOTE — Telephone Encounter (Signed)
Left msg for pt to return call.

## 2010-12-23 NOTE — Telephone Encounter (Signed)
Pt aware of results 

## 2010-12-23 NOTE — Telephone Encounter (Signed)
Flagyl 500 mg tid #21 ; avoid alcohol on this

## 2010-12-28 ENCOUNTER — Encounter (HOSPITAL_BASED_OUTPATIENT_CLINIC_OR_DEPARTMENT_OTHER): Payer: Medicare Other | Admitting: Oncology

## 2010-12-28 DIAGNOSIS — D0590 Unspecified type of carcinoma in situ of unspecified breast: Secondary | ICD-10-CM

## 2010-12-28 DIAGNOSIS — C50419 Malignant neoplasm of upper-outer quadrant of unspecified female breast: Secondary | ICD-10-CM

## 2011-01-11 ENCOUNTER — Encounter (HOSPITAL_COMMUNITY)
Admission: RE | Admit: 2011-01-11 | Discharge: 2011-01-11 | Disposition: A | Discharge status: Home / Self Care | Payer: Medicare Other | Source: Ambulatory Visit | Attending: General Surgery | Admitting: General Surgery

## 2011-01-11 LAB — COMPREHENSIVE METABOLIC PANEL
Alkaline Phosphatase: 54 U/L (ref 39–117)
BUN: 17 mg/dL (ref 6–23)
CO2: 30 mEq/L (ref 19–32)
Chloride: 104 mEq/L (ref 96–112)
Creatinine, Ser: 0.59 mg/dL (ref 0.50–1.10)
GFR calc Af Amer: >60 mL/min (ref >60)
GFR calc non Af Amer: >60 mL/min (ref >60)
Glucose, Bld: 115 mg/dL — ABNORMAL HIGH (ref 70–99)
Potassium: 4.4 mEq/L (ref 3.5–5.1)
Total Bilirubin: 0.5 mg/dL (ref 0.3–1.2)

## 2011-01-11 LAB — URINALYSIS, ROUTINE W REFLEX MICROSCOPIC
Ketones, ur: NEGATIVE mg/dL
Leukocytes, UA: NEGATIVE
Protein, ur: NEGATIVE mg/dL
Urobilinogen, UA: 0.2 mg/dL (ref 0.0–1.0)

## 2011-01-11 LAB — CBC
HCT: 39.8 % (ref 36.0–46.0)
Hemoglobin: 13.6 g/dL (ref 12.0–15.0)
MCV: 92.1 fL (ref 78.0–100.0)
RBC: 4.32 MIL/uL (ref 3.87–5.11)
RDW: 13.3 % (ref 11.5–15.5)
WBC: 6.2 10*3/uL (ref 4.0–10.5)

## 2011-01-11 LAB — SURGICAL PCR SCREEN: MRSA, PCR: NEGATIVE

## 2011-01-11 LAB — DIFFERENTIAL
Eosinophils Relative: 1 % (ref 0–5)
Lymphocytes Relative: 29 % (ref 12–46)
Lymphs Abs: 1.8 10*3/uL (ref 0.7–4.0)
Neutro Abs: 3.9 10*3/uL (ref 1.7–7.7)

## 2011-01-13 ENCOUNTER — Inpatient Hospital Stay (HOSPITAL_COMMUNITY)
Admission: RE | Admit: 2011-01-13 | Discharge: 2011-01-14 | DRG: 581 | Disposition: A | Discharge status: Home / Self Care | Payer: Medicare Other | Source: Ambulatory Visit | Attending: General Surgery | Admitting: General Surgery

## 2011-01-13 ENCOUNTER — Ambulatory Visit (HOSPITAL_COMMUNITY)
Admission: RE | Admit: 2011-01-13 | Discharge: 2011-01-13 | Disposition: A | Discharge status: Home / Self Care | Payer: Medicare Other | Source: Ambulatory Visit | Attending: General Surgery | Admitting: General Surgery

## 2011-01-13 ENCOUNTER — Other Ambulatory Visit (INDEPENDENT_AMBULATORY_CARE_PROVIDER_SITE_OTHER): Payer: Self-pay | Admitting: General Surgery

## 2011-01-13 DIAGNOSIS — C50919 Malignant neoplasm of unspecified site of unspecified female breast: Secondary | ICD-10-CM

## 2011-01-13 DIAGNOSIS — K219 Gastro-esophageal reflux disease without esophagitis: Secondary | ICD-10-CM | POA: Diagnosis present

## 2011-01-13 DIAGNOSIS — Z01812 Encounter for preprocedural laboratory examination: Secondary | ICD-10-CM

## 2011-01-13 HISTORY — PX: BREAST SURGERY: SHX581

## 2011-01-13 MED ORDER — TECHNETIUM TC 99M SULFUR COLLOID FILTERED
1.0000 | Freq: Once | INTRAVENOUS | Status: AC | PRN
  Administered 2011-01-13: 1 via INTRADERMAL

## 2011-01-13 NOTE — Op Note (Signed)
Courtney [name], Courtney [name]                 ACCOUNT NO.:  [id number]  MEDICAL RECORD NO.:  [id number]  LOCATION:  SDSC                         FACILITY:  [facility]  PHYSICIAN:  Angelia Mould. Derrell Lolling, M.D.DATE OF BIRTH:  1945/06/10  DATE OF PROCEDURE:  01/13/2011 DATE OF DISCHARGE:                              OPERATIVE REPORT   PREOPERATIVE DIAGNOSES: 1. Multifocal invasive cancer left breast. 2. Invasive cancer right breast.  POSTOPERATIVE DIAGNOSES: 1. Multifocal invasive cancer left breast. 2. Invasive cancer right breast, clinical stage T1C, M0.  OPERATION PERFORMED: 1. Inject blue dye into right breast and left breast. 2. Bilateral total mastectomy. 3. Bilateral sentinel lymph node mapping and biopsy.  SURGEON:  Angelia Mould. Derrell Lolling, MD  FIRST ASSISTANT:  Anselm Pancoast. Zachery Dakins, MD  OPERATIVE INDICATIONS:  This is a 65 year old Caucasian female who underwent screening mammograms recently.  A 1.8-cm density was noted in the left breast laterally at about the 3 o'clock position.  Image-guided biopsy showed invasive ductal carcinoma, receptor positive, HER2 negative, Ki-67 of 5%.  Subsequent MRI showed 2 other areas of enhancement, one in the left breast medially and superiorly and one in the right breast in the upper outer quadrant centrally.  The two additional areas were biopsied and they both showed invasive cancer. The patient was counseled following the second round of biopsies and she clearly wished to have bilateral mastectomies with reconstruction. She was counseled by Dr. Etter Sjogren and the decision was made to proceed with bilateral mastectomy, bilateral sentinel node biopsy and bilateral tissue expanders.  She is brought to the operating room electively.  OPERATIVE TECHNIQUE:  The patient arrived and was taken to the holding area at [facility] operating room.  Both breasts were injected with radionuclide by the nuclear medicine technician.  The patient was taken to the operating  room.  General anesthesia was induced.  Following an alcohol prep, I injected 5 mL of blue dye into both the right breast and the left breast, subareolar area.  This was a mixture of 2 mL of methylene blue mixed with 3 mL of saline.  Both breast were massaged for 5 minutes.  Intravenous antibiotics were given.  Surgical time-out was held identifying correct patient, correct procedure and correct site.  The neck and chest and arms and axilla were then prepped and draped in a sterile fashion.  I very carefully drew out and mapped out symmetrical bilateral transverse elliptical incisions.  On both the right side and the left side, I made the transverse elliptical incisions and raise skin flaps superiorly to the infraclavicular area, medially to the parasternal area inferiorly and to the anterior rectus sheath and laterally to the anterior border of the latissimus dorsi muscle.  On both the right side and the left side, I dissected the breast off of the pectoralis major and minor muscle.  On both sides, I incised the clavipectoral fascia and enter the axilla.  On the left side, I used the NeoProbe and mapped out and removed.  Three sentinel lymph nodes which were hot and blue.  On the right side, I used the NeoProbe and mapped out, 4 sentinel lymph nodes which were hot and blue.  Each  breast was marked with a silk suture at the lateral skin margin to orient the pathologist.  On each side, I then completed the dissection of the breast and sent the specimen to the lab.  We irrigated both wounds.  Hemostasis was excellent.  The skin flaps looked very good.  The wounds were packed with saline.  At this point, we had probably lost 100-150 mL of blood.  Sponge, needle and instruments counts were correct.  Dr. Etter Sjogren came to the operating room and assumed control of the operation.  He will dictate his reconstruction separately.     Angelia Mould. Derrell Lolling, M.D.     HMI/MEDQ  D:   01/13/2011  T:  01/13/2011  Job:  [id number]  cc:   Maryln Gottron, M.D. Etter Sjogren, M.D. Daryl Eastern, M.D. Titus Dubin. Alwyn Ren, MD,FACP,FCCP Drue Second, M.D.  Electronically Signed by Claud Kelp M.D. on 01/13/2011 05:02:34 PM

## 2011-01-14 ENCOUNTER — Telehealth: Payer: Self-pay | Admitting: *Deleted

## 2011-01-14 NOTE — Telephone Encounter (Signed)
Prior auth denied coverage beyond 90 days per 180 day period is provided for severe/atypical GERD with related disorders , moderate GERD with daily/disabling symptoms having failed a 30 day trial of high-dose H2 blockers, or peptic ulcer disease, Barrett's esophagus or hypersecretory conditions, or prevention of NSAID or steroid related ulcer.

## 2011-01-15 ENCOUNTER — Encounter: Payer: Self-pay | Admitting: *Deleted

## 2011-01-15 NOTE — Telephone Encounter (Signed)
Severe GERD causing protracted cough . This is in context of her being diagnosed with bilateral breast cancer for which she had double mastectomy 01/13/2011(this week).

## 2011-01-15 NOTE — Telephone Encounter (Signed)
Appeal letter faxed and mailed awaiting response.

## 2011-01-18 ENCOUNTER — Telehealth (INDEPENDENT_AMBULATORY_CARE_PROVIDER_SITE_OTHER): Payer: Self-pay | Admitting: General Surgery

## 2011-01-18 NOTE — Telephone Encounter (Addendum)
Message copied by Latricia Heft on Mon Jan 18, 2011  2:14 PM ------      Message from: Ernestene Mention      Created: Sun Jan 17, 2011 11:19 AM       Please call path report to patient. "Nodes negative and margins clear". Notified patient of pathology

## 2011-01-21 NOTE — Op Note (Signed)
[name], [name]                 ACCOUNT NO.:  [id number]  MEDICAL RECORD NO.:  [id number]  LOCATION:  5148                         FACILITY:  [facility]  PHYSICIAN:  Etter Sjogren, M.D.     DATE OF BIRTH:  09/19/1945  DATE OF PROCEDURE:  01/13/2011 DATE OF DISCHARGE:                              OPERATIVE REPORT   PREOPERATIVE DIAGNOSIS:  Bilateral breast cancer.  POSTOPERATIVE DIAGNOSIS:  Bilateral breast cancer.  PROCEDURES PERFORMED: 1. Left breast reconstruction with tissue expander. 2. Right breast reconstruction with tissue expander and the use of     acellular dermal matrix (HD Flex).  SURGEON:  Etter Sjogren, MD  ANESTHESIA:  General.  ESTIMATED BLOOD LOSS:  20 mL.  DRAINS:  Two 19-French drains left on each side.  CLINICAL NOTE:  A 65 year old woman, has bilateral breast cancer will be having mastectomy and desired reconstruction.  Options were discussed and she elected to have staged procedures and placement tissue expander at the time of mastectomy followed later by placement of implants.  Procedures and risks plus complications were discussed with her in great detail including not limited to bleeding, infection, anesthesia, complications, healing problems, scarring, loss of sensation, fluid accumulations, pneumothorax, pulmonary embolism, failure device, capsular contracture, wrinkles, ripples, displacement of device, asymmetry disappointment, chronic pain, and she also understood the possible use of HD Flex  deflects in the event that was not of muscle coverage with the tissue expanders and she understood all of these things and wished to proceed.  PROCEDURE IN DETAIL:  The patient was in the operating room and bilateral mastectomy was completed.  The wounds were irrigated thoroughly with saline and hemostasis electrocautery.  The pectoralis muscles were elevated as well as serratus and the connection of the pectoralis with the rectus elevating a little bit  of rectus fascia, anterior fascia inferiorly, and a submuscular space was developed on the left side.  On the right side, there was an area medially at the inferomedial position where there was lack muscle coverage and it was felt that HD Flex could be utilized there.  Thorough irrigation with saline, thorough irrigation with antibiotic solution.  Hemostasis with electrocautery.  The expanders were soaked in antibiotic solution after thoroughly cleaning gloves that they were prepared.  The air was removed, 50 mL of sterile saline placed using a closed filling system, returned to  the antibiotic solution.  Antibiotic solution again placed in the wound bed and expanders were positioned.  The muscle was closed with 3-0 Vicryl interrupted figure-of-eight sutures.  Care was taken to avoid damage to underlying tissue expanders which were kept under direct vision at all times.  Medially on the right side, the HD Flex was soaked in antibiotic solution for greater than 5 minutes.  It was placed with the dermal side up towards the mastectomy skin flaps and the epidermal side down towards the tissue expander.  It was placed and 3-0 PDS running simple suture was used to secure it and this was done with great care to avoid damage to underlying tissue expander.  Again antibiotic solution placed and drains were positioned, two 19-French brought through separate stab wounds inferolaterally and secured with 3-0  Prolene sutures, one is placed in the axilla where the lymph node biopsy has been performed and the other is placed in the mastectomy flaps.  The skin flaps had excellent color and appeared to be viable.  There did not appear to be any vascular compromise on the inferior or superior mastectomy flap on either side.  The skin was closed with 3-0 Monocryl, interrupted inverted deep dermal sutures and Dermabond.  Biopatch dressings were placed around the drain sites and Tegaderm used over this to  secure them and the chest vest was placed and she was transferred to recovery room stable having tolerated procedure well.     Etter Sjogren, M.D.     DB/MEDQ  D:  01/13/2011  T:  01/13/2011  Job:  [id number]  Electronically Signed by Etter Sjogren M.D. on 01/21/2011 01:34:04 PM

## 2011-01-21 NOTE — Discharge Summary (Signed)
  NAMEKATHRIN, FOLDEN                 ACCOUNT NO.:  [id number]  MEDICAL RECORD NO.:  [id number]  LOCATION:  5148                         FACILITY:  [facility]  PHYSICIAN:  Etter Sjogren, M.D.     DATE OF BIRTH:  [birth year]  DATE OF ADMISSION:  01/13/2011 DATE OF DISCHARGE:  01/14/2011                              DISCHARGE SUMMARY   FINAL DIAGNOSIS:  Bilateral breast cancer.  PROCEDURES PERFORMED:  Bilateral mastectomy, bilateral sentinel lymph node mapping and sentinel lymph node biopsy, bilateral breast reconstruction with tissue expanders right side, placement of acellular dermal matrix.  SUMMARY OF HISTORY AND PHYSICAL:  A 65 year old woman with bilateral breast cancers.  She presents for bilateral mastectomy.  She understood the options for reconstruction and elected to have the use of staged procedures, placement of tissue expanders followed later by placement of implant.  She understood the nature of procedures, risks plus complications, and wished to proceed.  For further details of history and physical, please see chart.  COURSE IN THE HOSPITAL:  On admission, she was taken to surgery at which time bilateral mastectomy and bilateral sentinel lymph node mapping with sentinel lymph node biopsy performed.  She then underwent at the same time placement of tissue expanders on the right side.  She needed some reinforcement with acellular dermal matrix (HD FLEX).  Postoperatively, she did well.  She was tolerating diet on first postoperative day, voiding without difficulty, had a little bit of vasovagal response with initial ambulation and overcame that, and ambulating very well throughout the afternoon and vital signs stable, and it was felt that she is ready to be discharged.  She actually requested to be discharged. Her mastectomy skin flaps looked very healthy.  No evidence of vascular compromise.  Drains were functioning.  Drainage is thin.  DISPOSITION:  She is discharged on  Dilaudid 2 mg tablets 2-4 mg p.o. q.4- 6 h. p.r.n. for pain, Robaxin 500 mg one p.o. q.8, Keflex 250 mg p.o. q.i.d., and Colace 100 mg p.o. b.i.d.  No lifting, no vigorous activities.  No exercising.  No shower.  She will follow up in the office in a week.  FINAL DIAGNOSIS:  Bilateral breast cancer.  CONDITION AT DISCHARGE:  Good.     Etter Sjogren, M.D.     DB/MEDQ  D:  01/14/2011  T:  01/15/2011  Job:  [id number]  Electronically Signed by Etter Sjogren M.D. on 01/21/2011 01:33:58 PM

## 2011-01-28 ENCOUNTER — Encounter (INDEPENDENT_AMBULATORY_CARE_PROVIDER_SITE_OTHER): Payer: Medicare Other | Admitting: General Surgery

## 2011-02-03 ENCOUNTER — Encounter: Payer: Medicare Other | Admitting: Oncology

## 2011-02-10 NOTE — Telephone Encounter (Signed)
Prior auth approved 01-19-11 until 01-19-12, approval letter scan to chart.

## 2011-02-22 ENCOUNTER — Encounter (INDEPENDENT_AMBULATORY_CARE_PROVIDER_SITE_OTHER): Payer: Medicare Other | Admitting: General Surgery

## 2011-03-04 ENCOUNTER — Encounter (INDEPENDENT_AMBULATORY_CARE_PROVIDER_SITE_OTHER): Payer: Self-pay | Admitting: General Surgery

## 2011-03-04 ENCOUNTER — Ambulatory Visit (INDEPENDENT_AMBULATORY_CARE_PROVIDER_SITE_OTHER): Payer: Medicare Other | Admitting: General Surgery

## 2011-03-04 VITALS — BP 162/100 | HR 75 | Temp 97.1°F | Ht 59.0 in | Wt 112.0 lb

## 2011-03-04 DIAGNOSIS — Z9889 Other specified postprocedural states: Secondary | ICD-10-CM

## 2011-03-04 NOTE — Patient Instructions (Signed)
All of your surgical wounds have healed nicely. We have reviewed your pathology report and you have been given a copy of that today. I agree with treatment plans outlined by Dr. Welton Flakes and Dr. Etter Sjogren. I will see you back in one year.

## 2011-03-04 NOTE — Progress Notes (Signed)
Subjective:     Patient ID: Courtney [name], female   DOB: [birth year], 65 y.o.   MRN: [id number]  HPI  This patient underwent bilateral total mastectomies and bilateral sentinel node biopsy and bilateral insertion tissue expanders on January 13, 2011. She did well from the surgery.  Left breast reveasl multifocal invasive ductal carcinoma, stage TIc., N0, receptor positive, Ki-67 14%, HER-2 negative. Right breast reveals a 1.3 cm invasive ductal carcinoma with negative nodes, stage TIc., N0. This was also receptor positive, also HER-2 negative, with Ki-67 of 12%.  She is recovering nicely. She has full range of motion of her shoulders back and is driving her car. Dr. Odis Luster continues to expand her on a periodic basis without problems.  Dr. Welton Flakes obtained an Oncotype DX and apparently had a low score and so the patient is on letrozole and will not require chemotherapy. Review of Systems     Objective:   Physical Exam The patient looks well and is in no distress. She has an excellent range of motion of the shoulders. There is no arm swelling.  Breasts reveal bilateral mastectomy scars well healed without any skin necrosis or infection. The expanders are fairly symmetrical. There is no axillary mass or fluid collection. No nodules.    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 Stage TIc., N0 bilaterally. Receptor positive and HER-2 negative bilaterally.  Recovering  uneventually and making good progress following bilateral mastectomy, bilateral sentinel node biopsy and bilateral tissue expander.    Plan:     Continue adjuvant letrozole.  Continue followup with Dr. Odis Luster, with anticipation of admission eventual exchange for permanent implant.  Considering the frequency with which she will be examined by Dr. Park Breed and  Dr. Odis Luster, I will see her in one year.

## 2011-04-05 ENCOUNTER — Other Ambulatory Visit: Payer: Self-pay | Admitting: Internal Medicine

## 2011-04-05 NOTE — Telephone Encounter (Signed)
Patient needs to schedule a CPX  

## 2011-04-05 NOTE — Telephone Encounter (Signed)
Pt calls with the complaint of sinus infection. Please let me know if she can be double booked for tomorrow. Everybody is booked at this time.[phone]

## 2011-04-06 ENCOUNTER — Encounter: Payer: Self-pay | Admitting: Family

## 2011-04-06 ENCOUNTER — Ambulatory Visit (INDEPENDENT_AMBULATORY_CARE_PROVIDER_SITE_OTHER): Payer: Medicare Other | Admitting: Family

## 2011-04-06 ENCOUNTER — Other Ambulatory Visit: Payer: Self-pay | Admitting: Internal Medicine

## 2011-04-06 DIAGNOSIS — I10 Essential (primary) hypertension: Secondary | ICD-10-CM

## 2011-04-06 DIAGNOSIS — J329 Chronic sinusitis, unspecified: Secondary | ICD-10-CM | POA: Insufficient documentation

## 2011-04-06 MED ORDER — AMOXICILLIN-POT CLAVULANATE 875-125 MG PO TABS: 1.0000 | ORAL_TABLET | Freq: Two times a day (BID) | ORAL | Status: AC

## 2011-04-06 MED ORDER — HYDROCHLOROTHIAZIDE 25 MG PO TABS: 25.0000 mg | ORAL_TABLET | Freq: Every day | ORAL | Status: DC

## 2011-04-06 NOTE — Telephone Encounter (Signed)
Dr.Hopper please advise on refill request for cough med

## 2011-04-06 NOTE — Patient Instructions (Signed)

## 2011-04-06 NOTE — Assessment & Plan Note (Signed)
BP Readings from Last 3 Encounters:  04/06/11 158/100  03/04/11 162/100  12/01/10 124/80  This is a new problem for the pt.  She is not overweight and reports that she already eats a low sodium healthy diet.  Will plan to start HCTZ and have pt follow up with Dr. Alwyn Ren in the office in 2 weeks for BMET and BP recheck.

## 2011-04-06 NOTE — Progress Notes (Signed)
Subjective:    Patient ID: Courtney [name], female    DOB: [birth year], 66 y.o.   MRN: [id number]  HPI  Ms.  Courtney [name] is a 65 yr old female who presents today with chief complaint of congestion. She has some nasal congestion as well as a productive cough. Phlegm is grey in color.  Symptoms started on 11/20.  She is using the Neti pot without improvement.  She reports that she had temperature yesterday around 100 degrees.  She reports remote smoking history.  Appetite is poor, energy is poor.  Notes she had some GI upset over the weekend that has resolved.  Thinks she had a GI bug she got from her granddaughter.      Review of Systems See HPI  Past Medical History  Diagnosis Date  . Fatigue   . Cough   . GERD (gastroesophageal reflux disease)   . Lump in female breast   . Asthma   . Bronchitis   . Sore throat   . Hoarseness   . Night sweats   . Cough   . Family history of breast cancer     History   Social History  . Marital Status: Married    Spouse Name: N/A    Number of Children: N/A  . Years of Education: N/A   Occupational History  . Not on file.   Social History Main Topics  . Smoking status: Former Games developer  . Smokeless tobacco: Not on file   Comment: Quit at age 60   . Alcohol Use: 1.8 oz/week    3 Glasses of wine per week  . Drug Use: No  . Sexually Active: Not on file   Other Topics Concern  . Not on file   Social History Narrative  . No narrative on file    Past Surgical History  Procedure Date  . Nasal septum surgery   . Thyroglossal duct cyst   . Abdominal hysterectomy   . Breast surgery 01/13/11    bilateral total mastectomy    Family History  Problem Relation Age of Onset  . Cancer Mother     breast  . Heart disease Father   . Cancer Sister     No Known Allergies  Current Outpatient Prescriptions on File Prior to Visit  Medication Sig Dispense Refill  . aspirin 81 MG tablet Take 81 mg by mouth daily.        . Azelaic Acid (FINACEA) 15 %  cream Apply topically 2 (two) times daily. After skin is thoroughly washed and patted dry, gently but thoroughly massage a thin film of azelaic acid cream into the affected area twice daily, in the morning and evening.       . calcium-vitamin D (OSCAL WITH D) 500-200 MG-UNIT per tablet Take 4 tablets by mouth daily.        . Cholecalciferol (VITAMIN D3) 1000 UNITS CAPS Take by mouth daily.        Marland Kitchen co-enzyme Q-10 30 MG capsule Take 50 mg by mouth 3 (three) times daily.        . Ginseng (ELEUTHERO PO) Take 410 mg by mouth daily.        Marland Kitchen HORSE CHESTNUT PO Take 250 mg by mouth daily.        Marland Kitchen letrozole (FEMARA) 2.5 MG tablet       . Multiple Vitamins-Minerals (MULTIVITAMIN WITH MINERALS) tablet Take 2 tablets by mouth daily.        . OMEGA 3 1000 MG  CAPS Take 1,000 mg by mouth daily.        Marland Kitchen omeprazole (PRILOSEC) 20 MG capsule TAKE ONE CAPSULE BY MOUTH TWICE DAILY BEFORE BREAKFAST AND EVENING MEAL  60 capsule  1  . Probiotic Product (PROBIOTIC FORMULA PO) Take by mouth.        . Pyridoxine HCl (VITAMIN B-6) 500 MG tablet Take 500 mg by mouth daily.        Marland Kitchen venlafaxine (EFFEXOR-XR) 37.5 MG 24 hr capsule         BP 158/100  Pulse 77  Temp(Src) 97.8 F (36.6 C) (Oral)  Ht 4\' 11"  (1.499 m)  Wt 109 lb (49.442 kg)  BMI 22.02 kg/m2  SpO2 100%       Objective:   Physical Exam  Constitutional: She appears well-developed and well-nourished.  HENT:  Right Ear: Tympanic membrane and ear canal normal.  Left Ear: Tympanic membrane and ear canal normal.  Mouth/Throat: Oropharynx is clear and moist. No oropharyngeal exudate, posterior oropharyngeal edema or posterior oropharyngeal erythema.       Mild left maxillary sinus tenderness to palpation.   Eyes: Conjunctivae are normal. No scleral icterus.  Cardiovascular: Normal rate and regular rhythm.   No murmur heard. Pulmonary/Chest: Effort normal and breath sounds normal. No respiratory distress. She has no wheezes. She has no rales. She  exhibits no tenderness.  Psychiatric: She has a normal mood and affect. Her behavior is normal. Judgment and thought content normal.          Assessment & Plan:

## 2011-04-06 NOTE — Assessment & Plan Note (Signed)
Will treat with Augmentin. She  Is instructed to contact us if symptoms worsen or if she is not feeling better in 2-3 days.

## 2011-04-06 NOTE — Telephone Encounter (Signed)
OK X1 

## 2011-04-07 MED ORDER — HYDROCODONE-HOMATROPINE 5-1.5 MG/5ML PO SYRP: ORAL_SOLUTION | ORAL | Status: DC

## 2011-04-07 NOTE — Telephone Encounter (Signed)
RX sent

## 2011-04-20 ENCOUNTER — Encounter: Payer: Self-pay | Admitting: Internal Medicine

## 2011-04-20 ENCOUNTER — Ambulatory Visit (INDEPENDENT_AMBULATORY_CARE_PROVIDER_SITE_OTHER): Payer: Medicare Other | Admitting: Internal Medicine

## 2011-04-20 DIAGNOSIS — I10 Essential (primary) hypertension: Secondary | ICD-10-CM

## 2011-04-20 DIAGNOSIS — J209 Acute bronchitis, unspecified: Secondary | ICD-10-CM

## 2011-04-20 DIAGNOSIS — J069 Acute upper respiratory infection, unspecified: Secondary | ICD-10-CM

## 2011-04-20 MED ORDER — LOSARTAN POTASSIUM-HCTZ 100-12.5 MG PO TABS: 1.0000 | ORAL_TABLET | Freq: Every day | ORAL | Status: DC

## 2011-04-20 MED ORDER — CEFUROXIME AXETIL 500 MG PO TABS: 500.0000 mg | ORAL_TABLET | Freq: Two times a day (BID) | ORAL | Status: AC

## 2011-04-20 NOTE — Patient Instructions (Signed)
Stop  HCTZ; take losartan 100/12.5 one half pill daily. If blood pressure is not at goal after 10-14 days; increase to one pill daily.

## 2011-04-20 NOTE — Progress Notes (Signed)
  Subjective:    Patient ID: Courtney [name], female    DOB: 04-08-1946, 65 y.o.   MRN: [id number]  HPI #1HYPERTENSION: Disease Monitoring: 158/100 two weeks ago; HCTZ started Blood pressure range- 120/70- 128/90  Chest pain- no Palpitations-no      Dyspnea- no Medications Compliance- yes  Lightheadedness- no    Edema- no Intermittent BP elevated since diagnosis of breast cancer. Mother had HTN  #2 Respiratory tract infection Onset/symptoms:Amoxicillin  Rxed  2 weeks ago for sinus symptoms ; these resolved but cough persisted & became productive 12/15 Exposures (illness/environmental/extrinsic):grandchildren classmates ? ill Progression of symptoms: Treatments/response:Neti pot twice a day, Mucinex, & cough syrup. She denies decongestants (see BP). She does have Symbicort which she's been using 2 puffs every 12 hours. She also has ProAir but has not used this.  Present symptoms: Fever/chills/sweats:sweats since 7/12 from hormone isues Frontal headache:yes Facial pain:no Nasal purulence:yes, yellow brown Sore throat:no Dental pain:no Lymphadenopathy:no Wheezing/shortness of breath:some wheezing; RAD only with bronchitis Cough/sputum/hemoptysis:yellow sputum , cough wakes her Pleuritic pain:no Associated extrinsic/allergic symptoms:itchy eyes/ sneezing:no but some watery eyes Past medical history: Seasonal allergies/asthma:no Smoking history:remotely , quit 1967            Review of Systems     Objective:   Physical Exam General appearance is of good health and nourishment; no acute distress or increased work of breathing is present.  No  lymphadenopathy about the head, neck, or axilla noted.   Eyes: No conjunctival inflammation or lid edema is present.   Ears:  External ear exam shows no significant lesions or deformities.  Otoscopic examination reveals clear canals, tympanic membranes are intact bilaterally without bulging, retraction, inflammation or  discharge.  Nose:  External nasal examination shows no deformity or inflammation. Nasal mucosa aredry without lesions or exudates. No septal dislocation .No obstruction to airflow.   Oral exam: Dental hygiene is good; lips and gums are healthy appearing.There is no oropharyngeal erythema or exudate noted.   Neck:  No deformities, thyromegaly, masses, or tenderness noted.   Supple with full range of motion without pain.   Heart:  Normal rate and regular rhythm. S1 and S2 normal without gallop, murmur, click, rub or other extra sounds.   Lungs: She is  Musical inspiratory rhonchi and low-grade wheezing in the right lower lobe. This did clear with repeated inspiratory maneuvers. No increased work of breathing.    Extremities:  No cyanosis, edema, or clubbing  noted    Skin: Warm & dry           Assessment & Plan:   #1 hypertension, exacerbated by stress. Family history hypertension in mother. I would recommend low-dose ARB and decreasing HCTZ  to half a pill a day. Blood pressure risk and goals were discussed. She denies taking decongestants which would raise blood pressure.  #2 bronchitis with reactive airways component. She should use the ProAir every 4 hours as needed for coughing and wheezing. She should continue a maintenance agent 2 puffs every 12 hrs. Cefuroxime will be prescribed for possible resistant organism causing bronchitis and #3. C Xray if no better  #3 upper respiratory tract infection, possible rhinosinusitis. If symptoms fail to resolve, CT sinus films would be appropriate. Other preventive measures in addition to using Neti pot were discussed.

## 2011-05-04 HISTORY — PX: OTHER SURGICAL HISTORY: SHX169

## 2011-05-05 ENCOUNTER — Encounter: Payer: Self-pay | Admitting: Internal Medicine

## 2011-05-05 ENCOUNTER — Ambulatory Visit (INDEPENDENT_AMBULATORY_CARE_PROVIDER_SITE_OTHER)
Admission: RE | Admit: 2011-05-05 | Discharge: 2011-05-05 | Disposition: A | Discharge status: Home / Self Care | Payer: Medicare Other | Source: Ambulatory Visit | Attending: Internal Medicine | Admitting: Internal Medicine

## 2011-05-05 ENCOUNTER — Other Ambulatory Visit: Payer: Self-pay | Admitting: Internal Medicine

## 2011-05-05 ENCOUNTER — Ambulatory Visit (INDEPENDENT_AMBULATORY_CARE_PROVIDER_SITE_OTHER): Payer: Medicare Other | Admitting: Internal Medicine

## 2011-05-05 DIAGNOSIS — R251 Tremor, unspecified: Secondary | ICD-10-CM

## 2011-05-05 DIAGNOSIS — J209 Acute bronchitis, unspecified: Secondary | ICD-10-CM

## 2011-05-05 DIAGNOSIS — R259 Unspecified abnormal involuntary movements: Secondary | ICD-10-CM

## 2011-05-05 LAB — CBC WITH DIFFERENTIAL/PLATELET
Basophils Absolute: 0 10*3/uL (ref 0.0–0.1)
Basophils Relative: 0.5 % (ref 0.0–3.0)
Eosinophils Absolute: 0.1 10*3/uL (ref 0.0–0.7)
Lymphocytes Relative: 21.6 % (ref 12.0–46.0)
MCHC: 33.5 g/dL (ref 30.0–36.0)
MCV: 92 fl (ref 78.0–100.0)
Monocytes Absolute: 0.4 10*3/uL (ref 0.1–1.0)
Neutro Abs: 3.6 10*3/uL (ref 1.4–7.7)
Neutrophils Relative %: 69.1 % (ref 43.0–77.0)
RBC: 4.06 Mil/uL (ref 3.87–5.11)
RDW: 15.1 % — ABNORMAL HIGH (ref 11.5–14.6)

## 2011-05-05 LAB — TSH: TSH: 1.28 u[IU]/mL (ref 0.35–5.50)

## 2011-05-05 LAB — BASIC METABOLIC PANEL
BUN: 17 mg/dL (ref 6–23)
Chloride: 106 mEq/L (ref 96–112)
Creatinine, Ser: 0.5 mg/dL (ref 0.4–1.2)
GFR: 130.65 mL/min (ref >60.00)
Potassium: 3.6 mEq/L (ref 3.5–5.1)

## 2011-05-05 LAB — T4, FREE: Free T4: 0.7 ng/dL (ref 0.60–1.60)

## 2011-05-05 MED ORDER — HYDROCODONE-HOMATROPINE 5-1.5 MG/5ML PO SYRP: 5.0000 mL | ORAL_SOLUTION | Freq: Four times a day (QID) | ORAL | Status: AC | PRN

## 2011-05-05 MED ORDER — MOXIFLOXACIN HCL 400 MG PO TABS: 400.0000 mg | ORAL_TABLET | Freq: Every day | ORAL | Status: AC

## 2011-05-05 NOTE — Patient Instructions (Signed)
Order for x-rays entered into  the computer; these will be performed at [address] across from [facility]. No appointment is necessary. 

## 2011-05-05 NOTE — Telephone Encounter (Signed)
Patient was seen today.

## 2011-05-05 NOTE — Progress Notes (Signed)
  Subjective:    Patient ID: Courtney [name], female    DOB: 1945-07-18, 66 y.o.   MRN: [id number]  HPI Respiratory tract infection Onset/symptoms:12/28 as cough with significant  yellow sputum  Exposures (illness/environmental/extrinsic):friend ill Progression of symptoms:cough worse  Treatments/response: She took Augmentin for 10 days beginning 12/4. She took cefuroxime for 10 days beginning 12/18. Present symptoms: Fever/chills/sweats:night sweats Frontal headache:no Facial pain:no Nasal purulence:scant yellow in am Sore throat:no Dental pain:no Lymphadenopathy:no Wheezing/shortness of breath:no Pleuritic pain:no Associated extrinsic/allergic symptoms:itchy eyes/ sneezing:no Past medical history: Seasonal allergies/asthma:RAD with bronchitis Smoking history:1967           Review of Systems she has noted intermittent tremor of her hands since August/2012.     Objective:   Physical Exam General appearance:good health ;well nourished; no acute distress or increased work of breathing is present.  No  lymphadenopathy about the head, neck, or axilla noted.   Eyes: No conjunctival inflammation or lid edema is present. EOMI .  Ears:  External ear exam shows no significant lesions or deformities.  Otoscopic examination reveals clear canals, tympanic membranes are intact bilaterally without bulging, retraction, inflammation or discharge.  Nose:  External nasal examination shows no deformity or inflammation. Nasal mucosa are pink and moist without lesions or exudates. No septal dislocation or deviation.No obstruction to airflow.   Oral exam: Dental hygiene is good; lips and gums are healthy appearing.There is no oropharyngeal erythema or exudate noted.     Heart:  Normal rate and regular rhythm. S1 and S2 normal without gallop,  click, rub or other extra sounds.  Grade1/2 -1 systolic murmur  Lungs:Chest clear to auscultation; no wheezes, rhonchi,rales ,or rubs present.No  increased work of breathing.    Extremities:  No cyanosis, edema, or clubbing  noted . No onycholysis  Neuro: fine hand tremor; DTRs normal   Skin: Warm & dry        Assessment & Plan:   #1 bronchitis, recurrent and protracted. Clinically at this time significant bronchospasm is not present. She has taken 2 strong antibiotics to date.  #2 tremor, intermittent  Plan: See orders recommendations.

## 2011-05-07 ENCOUNTER — Telehealth: Payer: Self-pay | Admitting: Oncology

## 2011-05-07 NOTE — Telephone Encounter (Signed)
pt aware of 06/14/11 appt,per mosaiq   aom

## 2011-05-28 ENCOUNTER — Other Ambulatory Visit: Payer: Self-pay | Admitting: *Deleted

## 2011-05-28 DIAGNOSIS — N959 Unspecified menopausal and perimenopausal disorder: Secondary | ICD-10-CM

## 2011-05-28 DIAGNOSIS — C50919 Malignant neoplasm of unspecified site of unspecified female breast: Secondary | ICD-10-CM

## 2011-05-28 MED ORDER — VENLAFAXINE HCL ER 37.5 MG PO CP24: ORAL_CAPSULE | ORAL | Status: DC

## 2011-05-31 ENCOUNTER — Telehealth: Payer: Self-pay

## 2011-05-31 NOTE — Telephone Encounter (Signed)
I recommend sinus CT if continuing to have upper respiratory  drainage.. I would also like a second opinion on her protracted symptoms from my Pulmonary  colleagues.

## 2011-05-31 NOTE — Telephone Encounter (Signed)
Left message to call office

## 2011-05-31 NOTE — Telephone Encounter (Signed)
Message left on voicemail: Patient still with drainage and not sure what the next step would be. Patient completed Asmanex  Dr.Hopper please advise

## 2011-06-01 ENCOUNTER — Telehealth: Payer: Self-pay | Admitting: *Deleted

## 2011-06-01 MED ORDER — FLUTICASONE PROPIONATE 50 MCG/ACT NA SUSP: 2.0000 | Freq: Every day | NASAL | Status: DC

## 2011-06-01 NOTE — Telephone Encounter (Signed)
Discuss with patient, Rx sent. 

## 2011-06-01 NOTE — Telephone Encounter (Signed)
See Message

## 2011-06-01 NOTE — Telephone Encounter (Signed)
Message copied by Verdene Rio on Tue Jun 01, 2011  8:56 AM ------      Message from: Pecola Lawless      Created: Tue Jun 01, 2011  8:43 AM       If she is having postnasal drainage which can cause cough; I recommend fluticasone one spray twice a day (spray into R side using L hand & R hand to L nostril , pointing toward opposite ear). If she is having purulence sinus discharge; sinus CT scan is recommend       If the problems is chronic cough; I want a pulmonologist to give me a second opinion.

## 2011-06-01 NOTE — Telephone Encounter (Signed)
See other note

## 2011-06-01 NOTE — Telephone Encounter (Signed)
Left message to call office

## 2011-06-14 ENCOUNTER — Other Ambulatory Visit: Payer: Medicare Other | Admitting: Lab

## 2011-06-14 ENCOUNTER — Telehealth: Payer: Self-pay | Admitting: *Deleted

## 2011-06-14 ENCOUNTER — Ambulatory Visit (HOSPITAL_BASED_OUTPATIENT_CLINIC_OR_DEPARTMENT_OTHER): Payer: Medicare Other | Admitting: Oncology

## 2011-06-14 VITALS — BP 132/79 | HR 75 | Temp 98.5°F | Ht 59.0 in | Wt 112.7 lb

## 2011-06-14 DIAGNOSIS — N951 Menopausal and female climacteric states: Secondary | ICD-10-CM

## 2011-06-14 DIAGNOSIS — F329 Major depressive disorder, single episode, unspecified: Secondary | ICD-10-CM

## 2011-06-14 DIAGNOSIS — C50419 Malignant neoplasm of upper-outer quadrant of unspecified female breast: Secondary | ICD-10-CM

## 2011-06-14 DIAGNOSIS — R232 Flushing: Secondary | ICD-10-CM

## 2011-06-14 DIAGNOSIS — C50919 Malignant neoplasm of unspecified site of unspecified female breast: Secondary | ICD-10-CM

## 2011-06-14 LAB — CBC WITH DIFFERENTIAL/PLATELET
BASO%: 0.6 % (ref 0.0–2.0)
LYMPH%: 21.4 % (ref 14.0–49.7)
MCHC: 34 g/dL (ref 31.5–36.0)
MONO#: 0.3 10*3/uL (ref 0.1–0.9)
Platelets: 263 10*3/uL (ref 145–400)
RBC: 4.16 10*6/uL (ref 3.70–5.45)
RDW: 13.3 % (ref 11.2–14.5)
WBC: 4.5 10*3/uL (ref 3.9–10.3)

## 2011-06-14 LAB — COMPREHENSIVE METABOLIC PANEL
ALT: 21 U/L (ref 0–35)
AST: 21 U/L (ref 0–37)
Alkaline Phosphatase: 57 U/L (ref 39–117)
CO2: 29 mEq/L (ref 19–32)
Chloride: 105 mEq/L (ref 96–112)
Creatinine, Ser: 0.75 mg/dL (ref 0.50–1.10)
Potassium: 4.7 mEq/L (ref 3.5–5.3)
Total Bilirubin: 0.3 mg/dL (ref 0.3–1.2)
Total Protein: 6.6 g/dL (ref 6.0–8.3)

## 2011-06-14 LAB — VITAMIN D 25 HYDROXY (VIT D DEFICIENCY, FRACTURES): Vit D, 25-Hydroxy: 64 ng/mL (ref 30–89)

## 2011-06-14 MED ORDER — LETROZOLE 2.5 MG PO TABS: 2.5000 mg | ORAL_TABLET | Freq: Every day | ORAL | Status: AC

## 2011-06-14 MED ORDER — VENLAFAXINE HCL ER 75 MG PO CP24: 75.0000 mg | ORAL_CAPSULE | Freq: Every day | ORAL | Status: DC

## 2011-06-14 NOTE — Telephone Encounter (Signed)
gave patient the new date and time on 12-2011 printed out calendar and gave to the patient

## 2011-06-14 NOTE — Progress Notes (Signed)
OFFICE PROGRESS NOTE  CC  [name], MD, MD [phone] [facility] [address] [place] [address] Dr. Claud Kelp Dr. Chipper Herb Dr. Karma Lew  DIAGNOSIS: 65 year old female with invasive ductal carcinoma with DCIS of the left breast. She also was found to have right breast cancer.  PRIOR THERAPY:  #1 status post bilateral mastectomies on 01/13/2011. Patient had a left simple mastectomy that revealed 2 foci of invasive ductal carcinoma one measuring 1.2 cm and a second focus measuring 0.5 cm grade 2 with associated DCIS and LV I. 8 sentinel nodes were negative for metastatic disease. Patient had a right simple mastectomy that showed a 1.3 in centimeter invasive ductal carcinoma grade 2 with associated DCIS no LV I. One sentinel node was negative for metastatic disease. Tumor #1 was estrogen receptor +100% progesterone receptor 100% proliferation marker 14% HER-2/neu negative. Tumor #2 was ER +100% PR +98% proliferation marker 13% and HER-2/neu negative.  #2 patient Had Oncotype DX performed on the largest tumor at the score was 10 giving her a 7% risk of recurrence at 5 years with antiestrogen therapy only.  #3 patient has had bilateral reconstruction performed. As is being done by Dr. Etter Sjogren.  #4 patient was started on Letrozole mg daily in October 2012. A total of 5 years as planned.  CURRENT THERAPY:Patient is on Letrozole 2.5 mg daily  INTERVAL HISTORY: Courtney [name] 66 y.o. female returns for Followup visit today. Overall she is doing well. She has tolerated the letrozole without any significant problems. She denies any fevers chills night sweats headaches shortness of breath chest pains palpitations no myalgias or arthralgias. She has completed her reconstruction and she is quite pleased with the results. Remainder of the 10 point review of systems is negative.  MEDICAL HISTORY: Past Medical History  Diagnosis Date  . Fatigue   . Cough   . GERD  (gastroesophageal reflux disease)   . Lump in female breast   . Asthma   . Bronchitis   . Sore throat   . Hoarseness   . Night sweats   . Cough   . Family history of breast cancer     ALLERGIES:   has no known allergies.  MEDICATIONS:  Current Outpatient Prescriptions  Medication Sig Dispense Refill  . aspirin 81 MG tablet Take 81 mg by mouth daily.        . Azelaic Acid (FINACEA) 15 % cream Apply topically 2 (two) times daily. After skin is thoroughly washed and patted dry, gently but thoroughly massage a thin film of azelaic acid cream into the affected area twice daily, in the morning and evening.       . budesonide-formoterol (SYMBICORT) 160-4.5 MCG/ACT inhaler Inhale 1 puff into the lungs 2 (two) times daily.        . calcium-vitamin D (OSCAL WITH D) 500-200 MG-UNIT per tablet Take 4 tablets by mouth daily.        . Cholecalciferol (VITAMIN D3) 1000 UNITS CAPS Take by mouth daily.        Marland Kitchen co-enzyme Q-10 30 MG capsule Take 50 mg by mouth 3 (three) times daily.        . fluticasone (FLONASE) 50 MCG/ACT nasal spray Place 2 sprays into the nose daily. one spray twice a day (spray into R side using L hand & R hand to L nostril , pointing toward opposite ear  16 g  1  . Ginseng (ELEUTHERO PO) Take 410 mg by mouth  daily.        Marland Kitchen HORSE CHESTNUT PO Take 250 mg by mouth daily.        Marland Kitchen letrozole (FEMARA) 2.5 MG tablet       . losartan-hydrochlorothiazide (HYZAAR) 100-12.5 MG per tablet Take 1 tablet by mouth daily.  30 tablet  5  . Multiple Vitamins-Minerals (MULTIVITAMIN WITH MINERALS) tablet Take 2 tablets by mouth daily.        . OMEGA 3 1000 MG CAPS Take 1,000 mg by mouth daily.        Marland Kitchen omeprazole (PRILOSEC) 20 MG capsule TAKE ONE CAPSULE BY MOUTH TWICE DAILY BEFORE BREAKFAST AND EVENING MEAL  60 capsule  1  . Probiotic Product (PROBIOTIC FORMULA PO) Take by mouth.        . Pyridoxine HCl (VITAMIN B-6) 500 MG tablet Take 500 mg by mouth daily.        Marland Kitchen venlafaxine (EFFEXOR-XR) 37.5  MG 24 hr capsule Take 1 capsule daily for 1 week, then increase to 2 capsules daily  60 capsule  1  . letrozole (FEMARA) 2.5 MG tablet Take 1 tablet (2.5 mg total) by mouth daily.  90 tablet  12  . venlafaxine (EFFEXOR-XR) 75 MG 24 hr capsule Take 1 capsule (75 mg total) by mouth daily.  90 capsule  12    SURGICAL HISTORY:  Past Surgical History  Procedure Date  . Nasal septum surgery   . Thyroglossal duct cyst   . Abdominal hysterectomy   . Breast surgery 01/13/11    bilateral total mastectomy    REVIEW OF SYSTEMS:  Pertinent items are noted in HPI.   PHYSICAL EXAMINATION: General appearance: alert, cooperative and appears stated age Neck: no adenopathy, no carotid bruit, no JVD, supple, symmetrical, trachea midline and thyroid not enlarged, symmetric, no tenderness/mass/nodules Lymph nodes: Cervical, supraclavicular, and axillary nodes normal. Resp: clear to auscultation bilaterally and normal percussion bilaterally Back: symmetric, no curvature. ROM normal. No CVA tenderness. Cardio: regular rate and rhythm, S1, S2 normal, no murmur, click, rub or gallop GI: soft, non-tender; bowel sounds normal; no masses,  no organomegaly Extremities: extremities normal, atraumatic, no cyanosis or edema Neurologic: Alert and oriented X 3, normal strength and tone. Normal symmetric reflexes. Normal coordination and gait  ECOG PERFORMANCE STATUS: 0 - Asymptomatic  Blood pressure 132/79, pulse 75, temperature 98.5 F (36.9 C), height 4\' 11"  (1.499 m), weight 112 lb 11.2 oz (51.12 kg).  LABORATORY DATA: Lab Results  Component Value Date   WBC 4.5 06/14/2011   HGB 12.8 06/14/2011   HCT 37.7 06/14/2011   MCV 90.4 06/14/2011   PLT 263 06/14/2011      Chemistry      Component Value Date/Time   NA 144 06/14/2011 0854   K 4.7 06/14/2011 0854   CL 105 06/14/2011 0854   CO2 29 06/14/2011 0854   BUN 20 06/14/2011 0854   CREATININE 0.75 06/14/2011 0854      Component Value Date/Time   CALCIUM 9.5  06/14/2011 0854   ALKPHOS 57 06/14/2011 0854   AST 21 06/14/2011 0854   ALT 21 06/14/2011 0854   BILITOT 0.3 06/14/2011 0854       RADIOGRAPHIC STUDIES:  No results found.  ASSESSMENT: 66 year old female with  #1 bilateral breast cancer status post bilateral mastectomies. She has bilateral reconstruction done. She is currently on letrozole 2.5 mg daily and is tolerating it quite well.   PLAN:   #1 patient will continue the letrozole on a daily basis  for a total of 5 years.  #2 she will return in 6 months time for followup.   All questions were answered. The patient knows to call the clinic with any problems, questions or concerns. We can certainly see the patient much sooner if necessary.  I spent 30 minutes counseling the patient face to face. The total time spent in the appointment was 30 minutes.    Drue Second, MD Medical/Oncology [facility] [phone] (beeper) [phone] (Office)  06/14/2011, 4:36 PM

## 2011-07-27 ENCOUNTER — Other Ambulatory Visit: Payer: Self-pay | Admitting: Internal Medicine

## 2011-07-28 ENCOUNTER — Other Ambulatory Visit: Payer: Self-pay | Admitting: *Deleted

## 2011-07-28 DIAGNOSIS — C50419 Malignant neoplasm of upper-outer quadrant of unspecified female breast: Secondary | ICD-10-CM

## 2011-07-28 DIAGNOSIS — D0590 Unspecified type of carcinoma in situ of unspecified breast: Secondary | ICD-10-CM

## 2011-07-28 DIAGNOSIS — C50919 Malignant neoplasm of unspecified site of unspecified female breast: Secondary | ICD-10-CM

## 2011-07-28 MED ORDER — VENLAFAXINE HCL ER 75 MG PO CP24: 75.0000 mg | ORAL_CAPSULE | Freq: Every day | ORAL | Status: DC

## 2011-07-28 NOTE — Telephone Encounter (Signed)
Patient reports in February Dr. Welton Flakes put this in the computer but she was not given a script.  Explained the change to 75 mg and she verbalized understanding.  Asked what her Vit D level is and this result given.

## 2011-07-28 NOTE — Telephone Encounter (Signed)
Called patient and left message explaining effexor 75 mg take one daily has been filled as  Indicated with script given with  f/u on 06-14-11.

## 2011-09-16 ENCOUNTER — Encounter: Payer: Self-pay | Admitting: *Deleted

## 2011-09-21 ENCOUNTER — Encounter: Payer: Self-pay | Admitting: Internal Medicine

## 2011-09-28 ENCOUNTER — Other Ambulatory Visit: Payer: Self-pay | Admitting: Internal Medicine

## 2011-10-02 HISTORY — PX: RECONSTRUCTION / CORRECTION OF NIPPLE / AEROLA: SUR1073

## 2011-10-05 ENCOUNTER — Encounter: Payer: Self-pay | Admitting: Internal Medicine

## 2011-11-01 ENCOUNTER — Telehealth: Payer: Self-pay | Admitting: Internal Medicine

## 2011-11-01 NOTE — Telephone Encounter (Signed)
Pt is having a colonoscopy and wants to know if she should have an endoscopy as well. She has requested to speak with Dr. Alwyn Ren. Pt states she will be out from 5:30-8:15p.

## 2011-11-01 NOTE — Telephone Encounter (Signed)
Dr.Hopper please advise 

## 2011-11-19 ENCOUNTER — Ambulatory Visit (AMBULATORY_SURGERY_CENTER): Payer: Medicare Other | Admitting: *Deleted

## 2011-11-19 VITALS — Ht <= 58 in | Wt 108.2 lb

## 2011-11-19 DIAGNOSIS — Z1211 Encounter for screening for malignant neoplasm of colon: Secondary | ICD-10-CM

## 2011-11-19 MED ORDER — MOVIPREP 100 G PO SOLR: ORAL | Status: DC

## 2011-12-03 ENCOUNTER — Ambulatory Visit (AMBULATORY_SURGERY_CENTER): Payer: Medicare Other | Admitting: Internal Medicine

## 2011-12-03 ENCOUNTER — Encounter: Payer: Self-pay | Admitting: Internal Medicine

## 2011-12-03 VITALS — BP 149/94 | HR 74 | Temp 97.8°F | Resp 24 | Ht <= 58 in | Wt 108.0 lb

## 2011-12-03 DIAGNOSIS — Z1211 Encounter for screening for malignant neoplasm of colon: Secondary | ICD-10-CM

## 2011-12-03 MED ORDER — SODIUM CHLORIDE 0.9 % IV SOLN: 500.0000 mL | INTRAVENOUS | Status: DC

## 2011-12-03 NOTE — Op Note (Signed)
[facility] [address] [place], [place]  [address]  COLONOSCOPY PROCEDURE REPORT  PATIENT:  Courtney [name], Courtney [name]  MR#:  [id number] BIRTHDATE:  March 13, 1946, 65 yrs. old  GENDER:  female ENDOSCOPIST:  Hedwig Morton. Juanda Chance, MD REF. BY: PROCEDURE DATE:  12/03/2011 PROCEDURE:  Colonoscopy 19147 ASA CLASS:  Class I INDICATIONS:  colorectal cancer screening, average risk last colon 2003 MEDICATIONS:   MAC sedation, administered by CRNA, propofol (Diprivan) 420 mg  DESCRIPTION OF PROCEDURE:   After the risks and benefits and of the procedure were explained, informed consent was obtained. Digital rectal exam was performed and revealed no rectal masses. The LB [id number] endoscope was introduced through the anus and advanced to the cecum, which was identified by both the appendix and ileocecal valve.  The quality of the prep was good, using MoviPrep.  The instrument was then slowly withdrawn as the colon was fully examined. <<PROCEDUREIMAGES>>  FINDINGS:  Severe diverticulosis was found throughout the colon (see image1, image2, image3, and image6). extensive diverticulosis, tortuous,sapstic colon,large mouth diverticuli This was otherwise a normal examination of the colon (see image7, image4, and image5).   Retroflexed views in the rectum revealed no abnormalities.    The scope was then withdrawn from the patient and the procedure completed.  COMPLICATIONS:  None ENDOSCOPIC IMPRESSION: 1) Severe diverticulosis throughout the colon 2) Otherwise normal examination RECOMMENDATIONS: 1) High fiber diet. metamucil 1 tsp daily  REPEAT EXAM:  In 10 year(s) for.  ______________________________ Hedwig Morton. Juanda Chance, MD  CC:  Pecola Lawless, MD  n. Courtney DoctorHedwig Morton. Joclynn Lumb at 12/03/2011 12:59 PM  Courtney [name], [phone]

## 2011-12-03 NOTE — Progress Notes (Signed)
Patient did not experience any of the following events: a burn prior to discharge; a fall within the facility; wrong site/side/patient/procedure/implant event; or a hospital transfer or hospital admission upon discharge from the facility. (G8907) Patient did not have preoperative order for IV antibiotic SSI prophylaxis. (G8918)  

## 2011-12-03 NOTE — Patient Instructions (Addendum)

## 2011-12-06 ENCOUNTER — Telehealth: Payer: Self-pay | Admitting: *Deleted

## 2011-12-06 NOTE — Telephone Encounter (Signed)
  Follow up Call-  Call back number 12/03/2011  Post procedure Call Back phone  # [phone]  Permission to leave phone message Yes     Patient questions:  Do you have a fever, pain , or abdominal swelling? no Pain Score  0 *  Have you tolerated food without any problems? yes  Have you been able to return to your normal activities? yes  Do you have any questions about your discharge instructions: Diet   no Medications  no Follow up visit  no  Do you have questions or concerns about your Care? no  Actions: * If pain score is 4 or above: No action needed, pain <4.

## 2011-12-23 ENCOUNTER — Other Ambulatory Visit (HOSPITAL_BASED_OUTPATIENT_CLINIC_OR_DEPARTMENT_OTHER): Payer: Medicare Other | Admitting: Lab

## 2011-12-23 ENCOUNTER — Ambulatory Visit (HOSPITAL_BASED_OUTPATIENT_CLINIC_OR_DEPARTMENT_OTHER): Payer: Medicare Other | Admitting: Oncology

## 2011-12-23 ENCOUNTER — Telehealth: Payer: Self-pay | Admitting: Oncology

## 2011-12-23 ENCOUNTER — Encounter: Payer: Self-pay | Admitting: Oncology

## 2011-12-23 VITALS — BP 140/72 | HR 71 | Temp 99.1°F | Resp 20 | Ht <= 58 in | Wt 108.8 lb

## 2011-12-23 DIAGNOSIS — Z9010 Acquired absence of unspecified breast and nipple: Secondary | ICD-10-CM

## 2011-12-23 DIAGNOSIS — C50019 Malignant neoplasm of nipple and areola, unspecified female breast: Secondary | ICD-10-CM

## 2011-12-23 DIAGNOSIS — D72819 Decreased white blood cell count, unspecified: Secondary | ICD-10-CM

## 2011-12-23 DIAGNOSIS — C50419 Malignant neoplasm of upper-outer quadrant of unspecified female breast: Secondary | ICD-10-CM

## 2011-12-23 DIAGNOSIS — C50919 Malignant neoplasm of unspecified site of unspecified female breast: Secondary | ICD-10-CM

## 2011-12-23 DIAGNOSIS — Z170 Estrogen receptor positive status [ER+]: Secondary | ICD-10-CM

## 2011-12-23 DIAGNOSIS — N959 Unspecified menopausal and perimenopausal disorder: Secondary | ICD-10-CM

## 2011-12-23 LAB — CBC WITH DIFFERENTIAL/PLATELET
Basophils Absolute: 0.1 10*3/uL (ref 0.0–0.1)
EOS%: 2 % (ref 0.0–7.0)
HCT: 38.2 % (ref 34.8–46.6)
HGB: 12.6 g/dL (ref 11.6–15.9)
LYMPH%: 31.9 % (ref 14.0–49.7)
MCH: 30.2 pg (ref 25.1–34.0)
MCV: 91.6 fL (ref 79.5–101.0)
MONO%: 7.3 % (ref 0.0–14.0)
NEUT%: 57.1 % (ref 38.4–76.8)
Platelets: 252 10*3/uL (ref 145–400)

## 2011-12-23 LAB — COMPREHENSIVE METABOLIC PANEL
AST: 22 U/L (ref 0–37)
BUN: 19 mg/dL (ref 6–23)
Calcium: 9.4 mg/dL (ref 8.4–10.5)
Chloride: 106 mEq/L (ref 96–112)
Creatinine, Ser: 0.66 mg/dL (ref 0.50–1.10)

## 2011-12-23 NOTE — Progress Notes (Signed)
OFFICE PROGRESS NOTE  CC  Courtney Melnick, MD [phone] Courtney [name] [address] [place] [address] Dr. Claud Kelp Dr. Chipper Herb Dr. Karma Lew  DIAGNOSIS: 66 year old female with invasive ductal carcinoma with DCIS of the left breast. She also was found to have right breast cancer.  PRIOR THERAPY:  #1 status post bilateral mastectomies on 01/13/2011. Patient had a left simple mastectomy that revealed 2 foci of invasive ductal carcinoma one measuring 1.2 cm and a second focus measuring 0.5 cm grade 2 with associated DCIS and LV I. 8 sentinel nodes were negative for metastatic disease. Patient had a right simple mastectomy that showed a 1.3 in centimeter invasive ductal carcinoma grade 2 with associated DCIS no LV I. One sentinel node was negative for metastatic disease. Tumor #1 was estrogen receptor +100% progesterone receptor 100% proliferation marker 14% HER-2/neu negative. Tumor #2 was ER +100% PR +98% proliferation marker 13% and HER-2/neu negative.  #2 patient Had Oncotype DX performed on the largest tumor at the score was 10 giving her a 7% risk of recurrence at 5 years with antiestrogen therapy only.  #3 patient has had bilateral reconstruction performed.She is to have nipple reconstructed in a few months.  #4 patient was started on Letrozole mg daily in October 2012. A total of 5 years as planned.  CURRENT THERAPY:Patient is on Letrozole 2.5 mg daily  INTERVAL HISTORY: Courtney [name] 66 y.o. female returns for Followup visit today. Overall she is doing well. She has tolerated the letrozole without any significant problems. She denies any fevers chills night sweats headaches shortness of breath chest pains palpitations no myalgias or arthralgias. She has completed her reconstruction and she is quite pleased with the results. Remainder of the 10 point review of systems is negative.  MEDICAL HISTORY: Past Medical History  Diagnosis Date  . Fatigue   . Cough     . GERD (gastroesophageal reflux disease)   . Lump in female breast   . Asthma   . Bronchitis   . Sore throat   . Hoarseness   . Night sweats   . Cough   . Family history of breast cancer   . Hypertension   . Breast cancer July 2012    double mastectomy    ALLERGIES:   has no known allergies.  MEDICATIONS:  Current Outpatient Prescriptions  Medication Sig Dispense Refill  . aspirin 81 MG tablet Take 81 mg by mouth daily.        . Azelaic Acid (FINACEA) 15 % cream Apply topically 2 (two) times daily. After skin is thoroughly washed and patted dry, gently but thoroughly massage a thin film of azelaic acid cream into the affected area twice daily, in the morning and evening.       . calcium-vitamin D (OSCAL WITH D) 500-200 MG-UNIT per tablet Take 4 tablets by mouth daily.        . Cholecalciferol (VITAMIN D3) 1000 UNITS CAPS Take by mouth daily.        Marland Kitchen co-enzyme Q-10 30 MG capsule Take 50 mg by mouth 3 (three) times daily.        . fluticasone (FLONASE) 50 MCG/ACT nasal spray Place 2 sprays into the nose daily. one spray twice a day (spray into R side using L hand & R hand to L nostril , pointing toward opposite ear  16 g  1  . Ginseng (ELEUTHERO PO) Take 410 mg by mouth daily.        Marland Kitchen  HORSE CHESTNUT PO Take 250 mg by mouth daily.        Marland Kitchen letrozole (FEMARA) 2.5 MG tablet       . losartan-hydrochlorothiazide (HYZAAR) 100-12.5 MG per tablet Take 1 tablet by mouth daily.  30 tablet  5  . Multiple Vitamins-Minerals (MULTIVITAMIN WITH MINERALS) tablet Take 2 tablets by mouth daily.        . OMEGA 3 1000 MG CAPS Take 1,000 mg by mouth daily.        Marland Kitchen omeprazole (PRILOSEC) 20 MG capsule 1 by mouth twice daily  60 capsule  5  . Probiotic Product (PROBIOTIC FORMULA PO) Take by mouth.        . Pyridoxine HCl (VITAMIN B-6) 500 MG tablet Take 500 mg by mouth daily.        Marland Kitchen venlafaxine (EFFEXOR-XR) 75 MG 24 hr capsule Take 1 capsule (75 mg total) by mouth daily.  90 capsule  4    SURGICAL  HISTORY:  Past Surgical History  Procedure Date  . Nasal septum surgery   . Thyroglossal duct cyst   . Abdominal hysterectomy   . Breast surgery 01/13/11    bilateral total mastectomy  . Bilateral breast reconstruction Jan 2013  . Reconstruction / correction of nipple / aerola June 2013    REVIEW OF SYSTEMS:  Pertinent items are noted in HPI.   PHYSICAL EXAMINATION: General appearance: alert, cooperative and appears stated age Neck: no adenopathy, no carotid bruit, no JVD, supple, symmetrical, trachea midline and thyroid not enlarged, symmetric, no tenderness/mass/nodules Lymph nodes: Cervical, supraclavicular, and axillary nodes normal. Resp: clear to auscultation bilaterally and normal percussion bilaterally Back: symmetric, no curvature. ROM normal. No CVA tenderness. Cardio: regular rate and rhythm, S1, S2 normal, no murmur, click, rub or gallop GI: soft, non-tender; bowel sounds normal; no masses,  no organomegaly Extremities: extremities normal, atraumatic, no cyanosis or edema Neurologic: Alert and oriented X 3, normal strength and tone. Normal symmetric reflexes. Normal coordination and gait Bilateral breast examination: Patient has reconstructed breasts there is no evidence of skin changes well healed surgical scars. ECOG PERFORMANCE STATUS: 0 - Asymptomatic  Blood pressure 140/72, pulse 71, temperature 99.1 F (37.3 C), temperature source Oral, resp. rate 20, height 4\' 10"  (1.473 m), weight 108 lb 12.8 oz (49.351 kg).  LABORATORY DATA: Lab Results  Component Value Date   WBC 3.6* 12/23/2011   HGB 12.6 12/23/2011   HCT 38.2 12/23/2011   MCV 91.6 12/23/2011   PLT 252 12/23/2011      Chemistry      Component Value Date/Time   NA 144 06/14/2011 0854   K 4.7 06/14/2011 0854   CL 105 06/14/2011 0854   CO2 29 06/14/2011 0854   BUN 20 06/14/2011 0854   CREATININE 0.75 06/14/2011 0854      Component Value Date/Time   CALCIUM 9.5 06/14/2011 0854   ALKPHOS 57 06/14/2011 0854    AST 21 06/14/2011 0854   ALT 21 06/14/2011 0854   BILITOT 0.3 06/14/2011 0854       RADIOGRAPHIC STUDIES:  No results found.  ASSESSMENT: 66 year old female with  #1 bilateral breast cancer status post bilateral mastectomies. She has bilateral reconstruction done. She is currently on letrozole 2.5 mg daily and is tolerating it quite well.Patient has no evidence of recurrent disease.   PLAN:   #1 patient will continue the letrozole on a daily basis for a total of 5 years.  #2 she will return in 6 months time for followup.  All questions were answered. The patient knows to call the clinic with any problems, questions or concerns. We can certainly see the patient much sooner if necessary.  I spent >15 minutes counseling the patient face to face. The total time spent in the appointment was 30 minutes.    Drue Second, MD Medical/Oncology [employer] [name] [phone] (beeper) [phone] (Office)  12/23/2011, 11:32 AM

## 2011-12-23 NOTE — Telephone Encounter (Signed)
gve the pt her feb 2014 appt calendar °

## 2011-12-23 NOTE — Patient Instructions (Addendum)
1. Come off of effexor slowly as discussed  2. Try peridin C for hot flashes  I3. I will see you back in 6 months

## 2011-12-28 ENCOUNTER — Other Ambulatory Visit: Payer: Self-pay | Admitting: Internal Medicine

## 2012-02-13 ENCOUNTER — Encounter: Payer: Self-pay | Admitting: Internal Medicine

## 2012-02-13 DIAGNOSIS — M8580 Other specified disorders of bone density and structure, unspecified site: Secondary | ICD-10-CM | POA: Insufficient documentation

## 2012-02-18 ENCOUNTER — Ambulatory Visit (INDEPENDENT_AMBULATORY_CARE_PROVIDER_SITE_OTHER): Payer: Medicare Other

## 2012-02-18 DIAGNOSIS — Z23 Encounter for immunization: Secondary | ICD-10-CM

## 2012-02-23 ENCOUNTER — Encounter: Payer: Self-pay | Admitting: Internal Medicine

## 2012-02-29 ENCOUNTER — Encounter: Payer: Self-pay | Admitting: Internal Medicine

## 2012-02-29 ENCOUNTER — Ambulatory Visit (INDEPENDENT_AMBULATORY_CARE_PROVIDER_SITE_OTHER): Payer: Medicare Other | Admitting: General Surgery

## 2012-02-29 ENCOUNTER — Encounter (INDEPENDENT_AMBULATORY_CARE_PROVIDER_SITE_OTHER): Payer: Self-pay | Admitting: General Surgery

## 2012-02-29 ENCOUNTER — Ambulatory Visit (INDEPENDENT_AMBULATORY_CARE_PROVIDER_SITE_OTHER): Payer: Medicare Other | Admitting: Internal Medicine

## 2012-02-29 VITALS — HR 83 | Temp 98.4°F | Ht <= 58 in | Wt 110.4 lb

## 2012-02-29 VITALS — BP 126/68 | HR 76 | Temp 97.0°F | Resp 24 | Ht <= 58 in | Wt 112.2 lb

## 2012-02-29 DIAGNOSIS — M8580 Other specified disorders of bone density and structure, unspecified site: Secondary | ICD-10-CM

## 2012-02-29 DIAGNOSIS — R7301 Impaired fasting glucose: Secondary | ICD-10-CM

## 2012-02-29 DIAGNOSIS — R634 Abnormal weight loss: Secondary | ICD-10-CM

## 2012-02-29 DIAGNOSIS — M899 Disorder of bone, unspecified: Secondary | ICD-10-CM

## 2012-02-29 DIAGNOSIS — I10 Essential (primary) hypertension: Secondary | ICD-10-CM

## 2012-02-29 DIAGNOSIS — Z853 Personal history of malignant neoplasm of breast: Secondary | ICD-10-CM

## 2012-02-29 DIAGNOSIS — Z0000 Encounter for general adult medical examination without abnormal findings: Secondary | ICD-10-CM

## 2012-02-29 DIAGNOSIS — R1310 Dysphagia, unspecified: Secondary | ICD-10-CM

## 2012-02-29 DIAGNOSIS — R05 Cough: Secondary | ICD-10-CM

## 2012-02-29 LAB — LIPID PANEL
Total CHOL/HDL Ratio: 3
VLDL: 12.8 mg/dL (ref 0.0–40.0)

## 2012-02-29 LAB — LDL CHOLESTEROL, DIRECT: Direct LDL: 131.3 mg/dL

## 2012-02-29 LAB — HEMOGLOBIN A1C: Hgb A1c MFr Bld: 5.9 % (ref 4.6–6.5)

## 2012-02-29 MED ORDER — LOSARTAN POTASSIUM-HCTZ 100-12.5 MG PO TABS: ORAL_TABLET | ORAL | Status: DC

## 2012-02-29 MED ORDER — AMLODIPINE BESYLATE 5 MG PO TABS: 5.0000 mg | ORAL_TABLET | Freq: Every day | ORAL | Status: DC

## 2012-02-29 NOTE — Patient Instructions (Signed)
Your physical exam today shows no abnormality of the skin or lymph nodes. There is no evidence of breast cancer.  Your reconstruction looks good.  Continue taking the Femara and continue regular followup with Dr. Welton Flakes  Return to see Dr. Derrell Lolling in one year.

## 2012-02-29 NOTE — Patient Instructions (Addendum)
Preventive Health Care: Exercise  30-45  minutes a day, 3-4 days a week. Walking is especially valuable in preventing Osteoporosis. Eat a low-fat diet with lots of fruits and vegetables, up to 7-9 servings per day.  The triggers for refux  include stress; the "aspirin family" ; alcohol; peppermint; and caffeine (coffee, tea, cola, and chocolate). The aspirin family would include aspirin and the nonsteroidal agents such as ibuprofen &  Naproxen. Tylenol would not cause reflux. If having symptoms ; food & drink should be avoided for @ least 2 hours before going to bed.   Blood Pressure Goal  Ideally is an AVERAGE < 135/85. This AVERAGE should be calculated from @ least 5-7 BP readings taken @ different times of day on different days of week. You should not respond to isolated BP readings , but rather the AVERAGE for that week.   If you activate My Chart; the results can be released to you as soon as they populate from the lab. If you choose not to use this program; the labs have to be reviewed, copied & mailed   causing a delay in getting the results to you.

## 2012-02-29 NOTE — Progress Notes (Signed)
Patient ID: Courtney [name], female   DOB: 1946/04/16, 66 y.o.   MRN: [id number]  Chief Complaint  Patient presents with  . Follow-up    HPI Courtney [name] is a 66 y.o. female.  She returns for long-term followup of her bilateral breast cancer.  On 01/13/2011 she underwent bilateral total mastectomy and bilateral sentinel node biopsy and bilateral tissue expanders  insertion. On the right side she had a 1.3 cm invasive ductal carcinoma, ER positive, HER-2-negative, pathologic stage T1c. N0. On the left side she  had multifocal invasive carcinoma, also receptor positive HER-2 negative, also clinical and pathologic stage T1c. N0  Oncotype DX revealed a low recurrence score  She takes Femara and is followed by Dr. Welton Flakes. She takes Effexor every other day to mitigate hot flashes.  In January of this  she underwent removal of tissue expanders and replacement with implants by Dr. Odis Luster. She still needsw her nipples to be tattooed.  She's doing well. Has no complaints about her breast or chest wall. No unusual bone or joint pains. She is very active and exercises regularly HPI  Past Medical History  Diagnosis Date  . Fatigue   . Cough   . GERD (gastroesophageal reflux disease)   . Asthma   . Bronchitis   . Night sweats   . Family history of breast cancer   . Hypertension   . Breast cancer July 2012    double mastectomy; Dr Welton Flakes    Past Surgical History  Procedure Date  . Nasal septum surgery   . Thyroglossal duct cyst   . Abdominal hysterectomy   . Breast surgery 01/13/11    bilateral total mastectomy; Dr Derrell Lolling  . Bilateral breast reconstruction Jan 2013  . Reconstruction / correction of nipple / aerola June 2013  . Colonoscopy 2013    Dr Juanda Chance; diverticulosis    Family History  Problem Relation Age of Onset  . Breast cancer Mother   . Colon cancer Mother 66  . Heart attack Father     > 55  . Diabetes Father   . Cancer Sister     appendiceal  . Heart disease Maternal  Grandfather   . Stroke Neg Hx     Social History History  Substance Use Topics  . Smoking status: Former Smoker    Quit date: 05/03/1965  . Smokeless tobacco: Never Used   Comment: Quit at age 72   . Alcohol Use: 8.4 oz/week    14 Glasses of wine per week    No Known Allergies  Current Outpatient Prescriptions  Medication Sig Dispense Refill  . amLODipine (NORVASC) 5 MG tablet Take 1 tablet (5 mg total) by mouth daily.  30 tablet  11  . aspirin 81 MG tablet Take 81 mg by mouth daily.        . calcium-vitamin D (OSCAL WITH D) 500-200 MG-UNIT per tablet Take 4 tablets by mouth daily.        . Cholecalciferol (VITAMIN D3) 1000 UNITS CAPS Take by mouth daily.        Marland Kitchen co-enzyme Q-10 30 MG capsule Take 50 mg by mouth 3 (three) times daily.        . fluticasone (FLONASE) 50 MCG/ACT nasal spray Place 2 sprays into the nose as needed. one spray twice a day (spray into R side using L hand & R hand to L nostril , pointing toward opposite ear      . HORSE CHESTNUT PO Take 250 mg by  mouth daily.        Marland Kitchen letrozole (FEMARA) 2.5 MG tablet Take 2.5 mg by mouth daily.       Marland Kitchen losartan-hydrochlorothiazide (HYZAAR) 100-12.5 MG per tablet TAKE ONE TABLET BY MOUTH ONE TIME DAILY  30 tablet  11  . Multiple Vitamins-Minerals (MULTIVITAMIN WITH MINERALS) tablet Take 2 tablets by mouth daily.        . OMEGA 3 1000 MG CAPS Take 1,000 mg by mouth daily.        Marland Kitchen omeprazole (PRILOSEC) 20 MG capsule Take 20 mg by mouth. 1 by mouth every other day      . Pyridoxine HCl (VITAMIN B-6) 500 MG tablet Take 500 mg by mouth daily.        Marland Kitchen venlafaxine XR (EFFEXOR-XR) 75 MG 24 hr capsule Take 75 mg by mouth. 1 by mouth every other day      . DISCONTD: fluticasone (FLONASE) 50 MCG/ACT nasal spray Place 2 sprays into the nose daily. one spray twice a day (spray into R side using L hand & R hand to L nostril , pointing toward opposite ear  16 g  1  . DISCONTD: losartan-hydrochlorothiazide (HYZAAR) 100-12.5 MG per tablet  TAKE ONE TABLET BY MOUTH ONE TIME DAILY  30 tablet  5  . DISCONTD: omeprazole (PRILOSEC) 20 MG capsule 1 by mouth twice daily  60 capsule  5  . DISCONTD: venlafaxine (EFFEXOR-XR) 75 MG 24 hr capsule Take 1 capsule (75 mg total) by mouth daily.  90 capsule  4    Review of Systems Review of Systems  Constitutional: Negative for fever, chills and unexpected weight change.  HENT: Negative for hearing loss, congestion, sore throat, trouble swallowing and voice change.   Eyes: Negative for visual disturbance.  Respiratory: Negative for cough and wheezing.   Cardiovascular: Negative for chest pain, palpitations and leg swelling.  Gastrointestinal: Negative for nausea, vomiting, abdominal pain, diarrhea, constipation, blood in stool, abdominal distention and anal bleeding.  Genitourinary: Negative for hematuria, vaginal bleeding and difficulty urinating.  Musculoskeletal: Negative for arthralgias.  Skin: Negative for rash and wound.  Neurological: Negative for seizures, syncope and headaches.  Hematological: Negative for adenopathy. Does not bruise/bleed easily.  Psychiatric/Behavioral: Negative for confusion.    Blood pressure 126/68, pulse 76, temperature 97 F (36.1 C), temperature source Temporal, resp. rate 24, height 4' 9.5" (1.461 m), weight 112 lb 3.2 oz (50.894 kg).  Physical Exam Physical Exam  Constitutional: She is oriented to person, place, and time. She appears well-developed and well-nourished. No distress.  Eyes: Conjunctivae normal and EOM are normal. Pupils are equal, round, and reactive to light. Left eye exhibits no discharge. No scleral icterus.  Neck: Neck supple. No JVD present. No tracheal deviation present. No thyromegaly present.  Cardiovascular: Normal rate, regular rhythm, normal heart sounds and intact distal pulses.   No murmur heard. Pulmonary/Chest: Effort normal and breath sounds normal. No respiratory distress. She has no wheezes. She has no rales. She  exhibits no tenderness.       Bilateral mastectomy scars well healed. Bilateral nipple reconstruction complete. Symmetry good. Tissue soft. No nodules, ulcerations, or adenopathy.range of motion of both shoulders is 100%.  Musculoskeletal: She exhibits no edema and no tenderness.  Lymphadenopathy:    She has no cervical adenopathy.  Neurological: She is alert and oriented to person, place, and time. She exhibits normal muscle tone. Coordination normal.  Skin: Skin is warm. No rash noted. She is not diaphoretic. No erythema. No pallor.  Psychiatric: She has a normal mood and affect. Her behavior is normal. Judgment and thought content normal.    Data Reviewed   Assessment    Bilateral breast cancer, details described above.  Doing well one year following bilateral mastectomy bilateral sterile lead and reconstruction. No evidence of recurrence at one year      Plan    Continue Femara and followup with Dr. Welton Flakes in 6 months  Return to see me in one year for physical exam.       Angelia Mould. Derrell Lolling, M.D., [facility] General and Minimally invasive Surgery Breast and Colorectal Surgery Office:   [phone] Pager:   [phone]  02/29/2012, 5:47 PM

## 2012-02-29 NOTE — Progress Notes (Signed)
Subjective:    Patient ID: Courtney [name], female    DOB: 09/06/1945, 66 y.o.   MRN: [id number]  HPI Medicare Wellness Visit:  The following psychosocial & medical history were reviewed as required by Medicare.   Social history: caffeine: 1 cup coffee or tea daily , alcohol:  14 glasses of wine/ week ,  tobacco use : quit 1967  & exercise : water aerobics & yoga 5 days/ week   Home & personal  safety / fall risk: no issues, activities of daily living: no limitations , seatbelt use : yes , and smoke alarm employment : yes .  Power of Attorney/Living Will status : in place  Vision ( as recorded per Nurse) & Hearing  evaluation : Ophth exam 01/13. No hearing exam. Orientation :oriented X 3 , memory & recall :good,  math testing: good,and mood & affect : normal . Depression / anxiety: see screening Travel history : [place] 2003 , immunization status :up to date , transfusion history: no, and preventive health surveillance ( colonoscopies, BMD , etc as per protocol/ [facility]): colonoscopy 2013, Dental care:  Every 6 mos . Chart reviewed &  Updated. Active issues reviewed & addressed.       Review of Systems COUGH Onset:post URI since Summer 2013 Trigger:now eating or upon awakening Course:stable Treatment/efficacy:no better with nasal steroid Associated symptoms/signs:  URI symptoms: No facial pain, frontal headaches, nasal purulence, dental pain,sorethroat, or ear ache/otic discharge Extrinsic symptoms: No itchy eyes, sneezing, or angioedema Infectious symptoms: No fever, chills, sweats, and purulent secretions Chest symptoms: Intermittent clear - white sputum production, mainly in am . No associated dyspnea or wheezing GI symptoms: Intermittent dysphagia several times / week. No other reflux symptoms with PPI every other day Occupational/environmental exposures:no ACE inhibitor administration:no Past medical history/family history pulmonary disease: asthma post infections     Objective:   Physical Exam Gen.:  well-nourished in appearance. Alert, appropriate and cooperative throughout exam. Head: Normocephalic without obvious abnormalities Eyes: No corneal or conjunctival inflammation noted. Pupils equal round reactive to light and accommodation. Fundal exam is benign without hemorrhages, exudate, papilledema. Extraocular motion intact. Vision grossly normal. Ears: External  ear exam reveals no significant lesions or deformities. Canals clear .TMs normal. Hearing is grossly normal bilaterally. Nose: External nasal exam reveals no deformity or inflammation. Nasal mucosa are pink and moist. No lesions or exudates noted.  Mouth: Oral mucosa and oropharynx reveal no lesions or exudates. Teeth in good repair. Neck: No deformities, masses, or tenderness noted. Range of motion & Thyroid  normal Lungs: Normal respiratory effort; chest expands symmetrically. Lungs are clear to auscultation without rales, wheezes, or increased work of breathing. Heart: Normal rate and rhythm. Normal S1 and S2. No gallop, click, or rub. Grade 1/2 over 6 systolic murmur . Abdomen: Bowel sounds normal; abdomen soft and nontender. No masses, organomegaly or hernias noted. Genitalia: Dr Greta Doom                                   Musculoskeletal/extremities: No deformity or scoliosis noted of  the thoracic or lumbar spine. No clubbing, cyanosis, edema, or deformity noted. Range of motion  normal .Tone & strength  normal.Joints normal. Nail health  good. Vascular: Carotid, radial artery, dorsalis pedis and  posterior tibial pulses are full and equal. No bruits present. Neurologic: Alert and oriented x3. Deep tendon reflexes symmetrical and normal.  Skin: Intact without suspicious lesions or rashes. Lymph: No cervical, axillary lymphadenopathy present. Psych: Mood and affect are normal. Normally interactive                                                                                         Assessment &  Plan:  #1 Medicare Wellness Exam; criteria met ; data entered #2 cough; this may be multifactorial  #3 dysphagia, recurrent. Rule out esophageal stricture versus eosinophilic esophagitis. Plan: see Orders

## 2012-03-16 ENCOUNTER — Encounter: Payer: Self-pay | Admitting: Internal Medicine

## 2012-04-17 ENCOUNTER — Ambulatory Visit (INDEPENDENT_AMBULATORY_CARE_PROVIDER_SITE_OTHER): Payer: Medicare Other

## 2012-04-17 ENCOUNTER — Encounter: Payer: Self-pay | Admitting: *Deleted

## 2012-04-17 DIAGNOSIS — Z111 Encounter for screening for respiratory tuberculosis: Secondary | ICD-10-CM

## 2012-05-03 HISTORY — PX: ESOPHAGEAL DILATION: SHX303

## 2012-05-03 HISTORY — PX: COLONOSCOPY: SHX174

## 2012-05-12 ENCOUNTER — Ambulatory Visit: Payer: Medicare Other | Admitting: Internal Medicine

## 2012-05-17 ENCOUNTER — Encounter: Payer: Self-pay | Admitting: Internal Medicine

## 2012-05-17 ENCOUNTER — Ambulatory Visit (INDEPENDENT_AMBULATORY_CARE_PROVIDER_SITE_OTHER): Payer: Medicare Other | Admitting: Internal Medicine

## 2012-05-17 VITALS — BP 124/60 | HR 81 | Ht <= 58 in | Wt 111.0 lb

## 2012-05-17 DIAGNOSIS — R1319 Other dysphagia: Secondary | ICD-10-CM

## 2012-05-17 DIAGNOSIS — K219 Gastro-esophageal reflux disease without esophagitis: Secondary | ICD-10-CM

## 2012-05-17 DIAGNOSIS — R1310 Dysphagia, unspecified: Secondary | ICD-10-CM

## 2012-05-17 NOTE — Progress Notes (Signed)
Courtney [name] 17-Mar-1946 MRN [id number]  History of Present Illness:  This is a 67 year old white female with progressive solid food dysphagia. This has been occurring for over a year. Dry foods seem to cause more problems. She has had several episodes of food regurgitation while eating in a restaurant. She denies odynophagia. There is a history of gastroesophageal reflux and heartburn. She is currently on omeprazole 20 mg every other day. She has a nocturnal cough with clear sputum. We saw her in August 2013 for screening colonoscopy which found severe diverticulosis and tortuous spastic colon. There is a family history of colon cancer in her mother. Prior colonoscopies in 2003 and 2008 showed inflammatory polyp. Patient has a history of breast cancer.   Past Medical History  Diagnosis Date  . Fatigue   . Cough   . GERD (gastroesophageal reflux disease)   . Asthma   . Bronchitis   . Night sweats   . Family history of breast cancer   . Hypertension   . Breast cancer July 2012    double mastectomy; Dr Welton Flakes  . Diverticulosis    Past Surgical History  Procedure Date  . Nasal septum surgery   . Thyroglossal duct cyst   . Abdominal hysterectomy   . Breast surgery 01/13/11    bilateral total mastectomy; Dr Derrell Lolling  . Bilateral breast reconstruction Jan 2013  . Reconstruction / correction of nipple / aerola June 2013  . Colonoscopy 2013    Dr Juanda Chance; diverticulosis    reports that she quit smoking about 47 years ago. She has never used smokeless tobacco. She reports that she drinks about 8.4 ounces of alcohol per week. She reports that she does not use illicit drugs. family history includes Breast cancer in her mother; Cancer in her sister; Colon cancer (age of onset:60) in her mother; Diabetes in her father; Heart attack in her father; and Heart disease in her maternal grandfather.  There is no history of Stroke. No Known Allergies      Review of Systems: Positive for solid food  dysphagia. Positive cough. Denies shortness of breath  The remainder of the 10 point ROS is negative except as outlined in H&P   Physical Exam: General appearance  Well developed, in no distress. Eyes- non icteric. HEENT nontraumatic, normocephalic. Mouth no lesions, tongue papillated, no cheilosis. Neck supple without adenopathy, thyroid not enlarged, no carotid bruits, no JVD. Lungs Clear to auscultation bilaterally. Cor normal S1, normal S2, regular rhythm, no murmur,  quiet precordium. Abdomen: Soft with normoactive bowel sounds. No tenderness. Liver edge at costal margin.  Rectal: Not done. Extremities no pedal edema. Skin no lesions. Neurological alert and oriented x 3. Psychological normal mood and affect.  Assessment and Plan:  Problem #1 Gradual onset of solid food dysphagia suggestive of esophageal stricture. There is a long history of gastroesophageal reflux. Nocturnal cough is consistent with LPR. We have discussed head of the bed elevation and other antireflux measures and increasing her omeprazole to 20 mg once a day. We will proceed with an upper endoscopy and esophageal dilation. We will rule out Barrett's esophagus. I have mentioned the possibility of her having esophageal dysmotility. The final disposition will be made after her upper endoscopy.   05/17/2012 [name]

## 2012-05-17 NOTE — Patient Instructions (Addendum)
You have been scheduled for an endoscopy with propofol. Please follow written instructions given to you at your visit today. If you use inhalers (even only as needed) or a CPAP machine, please bring them with you on the day of your procedure.  CC: Dr [name]

## 2012-05-19 ENCOUNTER — Ambulatory Visit (AMBULATORY_SURGERY_CENTER): Payer: Medicare Other | Admitting: Internal Medicine

## 2012-05-19 ENCOUNTER — Encounter: Payer: Self-pay | Admitting: Internal Medicine

## 2012-05-19 ENCOUNTER — Ambulatory Visit (INDEPENDENT_AMBULATORY_CARE_PROVIDER_SITE_OTHER)
Admission: RE | Admit: 2012-05-19 | Discharge: 2012-05-19 | Disposition: A | Discharge status: Home / Self Care | Payer: Medicare Other | Source: Ambulatory Visit | Attending: Internal Medicine | Admitting: Internal Medicine

## 2012-05-19 ENCOUNTER — Telehealth: Payer: Self-pay | Admitting: Internal Medicine

## 2012-05-19 ENCOUNTER — Other Ambulatory Visit (INDEPENDENT_AMBULATORY_CARE_PROVIDER_SITE_OTHER): Payer: Medicare Other

## 2012-05-19 VITALS — BP 130/79 | HR 70 | Temp 99.0°F | Resp 22 | Ht <= 58 in | Wt 111.0 lb

## 2012-05-19 DIAGNOSIS — R509 Fever, unspecified: Secondary | ICD-10-CM

## 2012-05-19 DIAGNOSIS — K219 Gastro-esophageal reflux disease without esophagitis: Secondary | ICD-10-CM

## 2012-05-19 DIAGNOSIS — R05 Cough: Secondary | ICD-10-CM

## 2012-05-19 DIAGNOSIS — D131 Benign neoplasm of stomach: Secondary | ICD-10-CM

## 2012-05-19 DIAGNOSIS — R1310 Dysphagia, unspecified: Secondary | ICD-10-CM

## 2012-05-19 DIAGNOSIS — K222 Esophageal obstruction: Secondary | ICD-10-CM

## 2012-05-19 LAB — CBC WITH DIFFERENTIAL/PLATELET
Basophils Absolute: 0 10*3/uL (ref 0.0–0.1)
HCT: 37.1 % (ref 36.0–46.0)
Lymphs Abs: 0.5 10*3/uL — ABNORMAL LOW (ref 0.7–4.0)
MCHC: 33.3 g/dL (ref 30.0–36.0)
MCV: 91.5 fl (ref 78.0–100.0)
Monocytes Absolute: 0.3 10*3/uL (ref 0.1–1.0)
Neutro Abs: 9.4 10*3/uL — ABNORMAL HIGH (ref 1.4–7.7)
Platelets: 232 10*3/uL (ref 150.0–400.0)
RDW: 13.2 % (ref 11.5–14.6)

## 2012-05-19 MED ORDER — SODIUM CHLORIDE 0.9 % IV SOLN: 500.0000 mL | INTRAVENOUS | Status: DC

## 2012-05-19 NOTE — Progress Notes (Signed)
Patient did not experience any of the following events: a burn prior to discharge; a fall within the facility; wrong site/side/patient/procedure/implant event; or a hospital transfer or hospital admission upon discharge from the facility. (G8907) Patient did not have preoperative order for IV antibiotic SSI prophylaxis. (G8918)  

## 2012-05-19 NOTE — Telephone Encounter (Signed)
Pt's husband called to report that pt had severe chills with shaking approx 2 hrs after she got home from having had endoscopy today. Husband had left pt alone @ home and pt. Called him to say she was having severe chills. Husband got home to her in approx. 12 min and pt still a little chilled but severe shaking has stopped. Pt had eaten clam chowder & drank warm teabefore chills began Temperature 98 and BP 121/78 husband says. Encouraged pt to drink some warm fluids and rm temp water to get rehydrated and to call back if this happens again  Or certainly take to ER if needed. This concern will be forwarded to Dr. Juanda Chance.

## 2012-05-19 NOTE — Op Note (Signed)
[facility] [address] [place], [address]   ENDOSCOPY PROCEDURE REPORT  PATIENT: Courtney [name], Courtney [name]  MR#: [id number] BIRTHDATE: 01/29/1946 , 66  yrs. old GENDER: Female ENDOSCOPIST: Hart Carwin, MD REFERRED BY:  [name], M.D. PROCEDURE DATE:  05/19/2012 PROCEDURE:  EGD w/ biopsy and Savary dilation of esophagus ASA CLASS:     Class II INDICATIONS:  Dysphagia.to solids,  Chest pain.   cough, hx of GERD.  MEDICATIONS: MAC sedation, administered by CRNA, propofol (Diprivan) 200mg  IV, and Robinul 0.2 mg IV TOPICAL ANESTHETIC: none  DESCRIPTION OF PROCEDURE: After the risks benefits and alternatives of the procedure were thoroughly explained, informed consent was obtained.  The LB GIF-H180 [id number] endoscope was introduced through the mouth and advanced to the second portion of the duodenum. Without limitations.  The instrument was slowly withdrawn as the mucosa was fully examined.      esophagus esophageal lumen was tortuous ,distally lumen. was eccentric There was a mild esophageal stricture which easily allowed the endoscope to traverse into a small hiatal hernia. Size of the hernia was 3 centimeters. There was no evidence of acute esophagitis. Guidewire was placed through the stricture and Savary dilators 15, 16, and 17 mm passed through the stricture without difficulty. Gastric mucosa appeared normal with several fundic gland polyps in the proximal stomach. These were biopsied. Pyloric outlet as well as duodenum appeared normal biopsies were taken from GE junction to assess degree of fibrosis and to rule out Barrett's esophagus[          The scope was then withdrawn from the patient and the procedure completed.  COMPLICATIONS: There were no complications. ENDOSCOPIC IMPRESSION: mild distal esophageal stricture status post biopsies and dilatation to 17 mm Gastric fundic gland polyps status post biopsies 3 cm hiatal  hernia RECOMMENDATIONS:   antireflux measures Continue PPI Await results of the biopsies REPEAT EXAM: redil;ate prn  eSigned:  Hart Carwin, MD 05/19/2012 8:38 AM   CC:  PATIENT NAME:  Courtney [name] MR#: [id number]

## 2012-05-19 NOTE — Progress Notes (Signed)
Called to room to assist during endoscopic procedure.  Patient ID and intended procedure confirmed with present staff. Received instructions for my participation in the procedure from the performing physician.  

## 2012-05-19 NOTE — Telephone Encounter (Signed)
Per Dr. Juanda Chance, call patient and have her come for CBC and CXR this afternoon due to chills, coughing and increased temperature. Be sure patient is using her inhaler. Spoke with patient's husband and he states she has been sleeping,not coughing and is not having chills now. He talked with patient and she does not feel she needs to come in. She will continue to use her inhaler.

## 2012-05-19 NOTE — Telephone Encounter (Signed)
Patient's husband called back and her temperature is 101 now. She is coming for stat CBC and CXR. Dr. Juanda Chance aware.

## 2012-05-19 NOTE — Progress Notes (Signed)
1610 a/ox3 pleased with MAC, O2 Sats 89-91 without O2 O2 replaced @ 4l [place], SATS >97%,  Dr. Juanda Chance aware, report to Erskine Squibb RN

## 2012-05-19 NOTE — Patient Instructions (Addendum)
YOU HAD AN ENDOSCOPIC PROCEDURE TODAY AT THE [facility]: Refer to the procedure report that was given to you for any specific questions about what was found during the examination.  If the procedure report does not answer your questions, please call your gastroenterologist to clarify.  If you requested that your care partner not be given the details of your procedure findings, then the procedure report has been included in a sealed envelope for you to review at your convenience later.  YOU SHOULD EXPECT: Some feelings of bloating in the abdomen. Passage of more gas than usual.  Walking can help get rid of the air that was put into your GI tract during the procedure and reduce the bloating. If you had a lower endoscopy (such as a colonoscopy or flexible sigmoidoscopy) you may notice spotting of blood in your stool or on the toilet paper. If you underwent a bowel prep for your procedure, then you may not have a normal bowel movement for a few days.  DIET: Your first meal following the procedure should be a light meal and then it is ok to progress to your normal diet.  A half-sandwich or bowl of soup is an example of a good first meal.  Heavy or fried foods are harder to digest and may make you feel nauseous or bloated.  Likewise meals heavy in dairy and vegetables can cause extra gas to form and this can also increase the bloating.  Drink plenty of fluids but you should avoid alcoholic beverages for 24 hours.  ACTIVITY: Your care partner should take you home directly after the procedure.  You should plan to take it easy, moving slowly for the rest of the day.  You can resume normal activity the day after the procedure however you should NOT DRIVE or use heavy machinery for 24 hours (because of the sedation medicines used during the test).    SYMPTOMS TO REPORT IMMEDIATELY: A gastroenterologist can be reached at any hour.  During normal business hours, 8:30 AM to 5:00 PM Monday through Friday,  call [phone].  After hours and on weekends, please call the GI answering service at [phone] who will take a message and have the physician on call contact you.    Following upper endoscopy (EGD)  Vomiting of blood or coffee ground material  New chest pain or pain under the shoulder blades  Painful or persistently difficult swallowing  New shortness of breath  Fever of 100F or higher  Black, tarry-looking stools  FOLLOW UP: If any biopsies were taken you will be contacted by phone or by letter within the next 1-3 weeks.  Call your gastroenterologist if you have not heard about the biopsies in 3 weeks.  Our staff will call the home number listed on your records the next business day following your procedure to check on you and address any questions or concerns that you may have at that time regarding the information given to you following your procedure. This is a courtesy call and so if there is no answer at the home number and we have not heard from you through the emergency physician on call, we will assume that you have returned to your regular daily activities without incident.  SIGNATURES/CONFIDENTIALITY: You and/or your care partner have signed paperwork which will be entered into your electronic medical record.  These signatures attest to the fact that that the information above on your After Visit Summary has been reviewed and is understood.  Full   responsibility of the confidentiality of this discharge information lies with you and/or your care-partner.    Continue PPI  Hernia and stricture information given.  Post dilation diet given with instructions.

## 2012-05-19 NOTE — Telephone Encounter (Signed)
I have discussed the labs with the pt, please see separate note after LABs

## 2012-05-19 NOTE — Progress Notes (Signed)
1610 Pt States even though she has has double mastectomy dr has given the ok for sticks or BPs on either side.

## 2012-05-22 ENCOUNTER — Telehealth: Payer: Self-pay

## 2012-05-22 NOTE — Telephone Encounter (Signed)
  Follow up Call-  Call back number 05/19/2012 12/03/2011  Post procedure Call Back phone  # [phone]  Permission to leave phone message Yes Yes     Patient questions:  Do you have a fever, pain , or abdominal swelling? no Pain Score  0 *  Have you tolerated food without any problems? yes  Have you been able to return to your normal activities? yes  Do you have any questions about your discharge instructions: Diet   no Medications  no Follow up visit  no  Do you have questions or concerns about your Care? no  Actions: * If pain score is 4 or above: No action needed, pain <4.  Per the pt she had a reaction to the propofol and she had chills and fever 101 around 12:00 Friday.  She called and spoke with Dr. Juanda Chance.  Pt came in and saw a nurse.  Pt had chest x-ray which she said was negative and blood work.  The pt said Saturday 97.2 and felt better.  Back to normal this am.  Maw

## 2012-05-24 ENCOUNTER — Encounter: Payer: Self-pay | Admitting: Internal Medicine

## 2012-05-29 ENCOUNTER — Other Ambulatory Visit: Payer: Self-pay | Admitting: *Deleted

## 2012-05-29 MED ORDER — BUDESONIDE-FORMOTEROL FUMARATE 160-4.5 MCG/ACT IN AERO: INHALATION_SPRAY | RESPIRATORY_TRACT | Status: DC

## 2012-05-29 NOTE — Telephone Encounter (Signed)
Symbicort 160 ;1-2 puffs every 12 hours. Gargle and spit after use dispense one refill x2

## 2012-05-29 NOTE — Telephone Encounter (Signed)
Pt left WM that she would like to get a refill on symbicort. .Please advise no on med list. Last ov 02-29-12

## 2012-05-29 NOTE — Telephone Encounter (Signed)
RX sent

## 2012-06-07 ENCOUNTER — Telehealth: Payer: Self-pay | Admitting: Internal Medicine

## 2012-06-07 NOTE — Telephone Encounter (Signed)
I don't really have to see her if she has no problems.

## 2012-06-07 NOTE — Telephone Encounter (Signed)
Patient states she is not having any new problems. She thinks she is to have a f/u OV but cannot remember if it is in 3 weeks or 3 months.Please, advise.

## 2012-06-08 ENCOUNTER — Ambulatory Visit (HOSPITAL_BASED_OUTPATIENT_CLINIC_OR_DEPARTMENT_OTHER): Payer: Medicare Other | Admitting: Oncology

## 2012-06-08 ENCOUNTER — Other Ambulatory Visit (HOSPITAL_BASED_OUTPATIENT_CLINIC_OR_DEPARTMENT_OTHER): Payer: Medicare Other | Admitting: Lab

## 2012-06-08 ENCOUNTER — Encounter: Payer: Self-pay | Admitting: Oncology

## 2012-06-08 ENCOUNTER — Telehealth: Payer: Self-pay | Admitting: Oncology

## 2012-06-08 VITALS — BP 147/80 | HR 70 | Temp 98.3°F | Resp 20 | Ht <= 58 in | Wt 111.7 lb

## 2012-06-08 DIAGNOSIS — D0590 Unspecified type of carcinoma in situ of unspecified breast: Secondary | ICD-10-CM

## 2012-06-08 DIAGNOSIS — C50419 Malignant neoplasm of upper-outer quadrant of unspecified female breast: Secondary | ICD-10-CM

## 2012-06-08 DIAGNOSIS — Z170 Estrogen receptor positive status [ER+]: Secondary | ICD-10-CM

## 2012-06-08 DIAGNOSIS — Z853 Personal history of malignant neoplasm of breast: Secondary | ICD-10-CM

## 2012-06-08 DIAGNOSIS — N959 Unspecified menopausal and perimenopausal disorder: Secondary | ICD-10-CM

## 2012-06-08 DIAGNOSIS — D72819 Decreased white blood cell count, unspecified: Secondary | ICD-10-CM

## 2012-06-08 LAB — CBC WITH DIFFERENTIAL/PLATELET
BASO%: 1.4 % (ref 0.0–2.0)
EOS%: 2.6 % (ref 0.0–7.0)
LYMPH%: 27.3 % (ref 14.0–49.7)
MCH: 30.8 pg (ref 25.1–34.0)
MCHC: 33.3 g/dL (ref 31.5–36.0)
MONO#: 0.3 10*3/uL (ref 0.1–0.9)
RBC: 4.23 10*6/uL (ref 3.70–5.45)
WBC: 3.8 10*3/uL — ABNORMAL LOW (ref 3.9–10.3)
lymph#: 1 10*3/uL (ref 0.9–3.3)

## 2012-06-08 LAB — COMPREHENSIVE METABOLIC PANEL (CC13)
ALT: 33 U/L (ref 0–55)
AST: 24 U/L (ref 5–34)
CO2: 27 mEq/L (ref 22–29)
Chloride: 104 mEq/L (ref 98–107)
Creatinine: 0.7 mg/dL (ref 0.6–1.1)
Sodium: 142 mEq/L (ref 136–145)
Total Bilirubin: 0.74 mg/dL (ref 0.20–1.20)
Total Protein: 7.2 g/dL (ref 6.4–8.3)

## 2012-06-08 NOTE — Telephone Encounter (Signed)
Patient given Dr. Regino Schultze recommendation. She is not having any problems.

## 2012-06-08 NOTE — Telephone Encounter (Signed)
Left a message for patient to call me. 

## 2012-06-08 NOTE — Patient Instructions (Addendum)
Doing well  I will see you back in August 06 2013

## 2012-06-08 NOTE — Progress Notes (Signed)
OFFICE PROGRESS NOTE  CC  [name], MD [phone] [facility] [facility] [address] [place] [address] Dr. Claud Kelp Dr. Chipper Herb Dr. Karma Lew  DIAGNOSIS: 67 year old female with invasive ductal carcinoma with DCIS of the left breast. She also was found to have right breast cancer.  PRIOR THERAPY:  #1 status post bilateral mastectomies on 01/13/2011. Patient had a left simple mastectomy that revealed 2 foci of invasive ductal carcinoma one measuring 1.2 cm and a second focus measuring 0.5 cm grade 2 with associated DCIS and LV I. 8 sentinel nodes were negative for metastatic disease. Patient had a right simple mastectomy that showed a 1.3 in centimeter invasive ductal carcinoma grade 2 with associated DCIS no LV I. One sentinel node was negative for metastatic disease. Tumor #1 was estrogen receptor +100% progesterone receptor 100% proliferation marker 14% HER-2/neu negative. Tumor #2 was ER +100% PR +98% proliferation marker 13% and HER-2/neu negative.  #2 patient Had Oncotype DX performed on the largest tumor at the score was 10 giving her a 7% risk of recurrence at 5 years with antiestrogen therapy only.  #3 patient has had bilateral reconstruction performed.She is to have nipple reconstructed in a few months.  #4 patient was started on Letrozole mg daily in October 2012. A total of 5 years as planned.  CURRENT THERAPY:Patient is on Letrozole 2.5 mg daily  INTERVAL HISTORY: Courtney [name] 67 y.o. female returns for Followup visit today. Overall she is doing well. She has tolerated the letrozole without any significant problems. She denies any fevers chills night sweats headaches shortness of breath chest pains palpitations no myalgias or arthralgias. She has completed her reconstruction and she is quite pleased with the results. Remainder of the 10 point review of systems is negative.  MEDICAL HISTORY: Past Medical History  Diagnosis Date  . Fatigue   . Cough    . GERD (gastroesophageal reflux disease)   . Asthma   . Bronchitis   . Night sweats   . Family history of breast cancer   . Hypertension   . Breast cancer July 2012    double mastectomy; Dr Welton Flakes  . Diverticulosis     ALLERGIES:   has no known allergies.  MEDICATIONS:  Current Outpatient Prescriptions  Medication Sig Dispense Refill  . amLODipine (NORVASC) 5 MG tablet Take 1 tablet (5 mg total) by mouth daily.  30 tablet  11  . aspirin 81 MG tablet Take 81 mg by mouth daily.        . Azelaic Acid (FINACEA EX) Apply topically as directed.      . budesonide-formoterol (SYMBICORT) 160-4.5 MCG/ACT inhaler 1-2 puffs every 12 hours. Gargle and spit after use  1 Inhaler  2  . calcium-vitamin D (OSCAL WITH D) 500-200 MG-UNIT per tablet Take 4 tablets by mouth daily.        . Cholecalciferol (VITAMIN D3) 1000 UNITS CAPS Take by mouth daily.        Marland Kitchen co-enzyme Q-10 30 MG capsule Take 50 mg by mouth 3 (three) times daily.        . Ginseng (ELEUTHERO PO) Take by mouth as directed.      Marland Kitchen HORSE CHESTNUT PO Take 250 mg by mouth daily.        Marland Kitchen letrozole (FEMARA) 2.5 MG tablet Take 2.5 mg by mouth daily.       Marland Kitchen losartan-hydrochlorothiazide (HYZAAR) 100-12.5 MG per tablet TAKE ONE TABLET BY MOUTH ONE TIME DAILY  30  tablet  11  . Multiple Vitamins-Minerals (MULTIVITAMIN WITH MINERALS) tablet Take 2 tablets by mouth daily.        . OMEGA 3 1000 MG CAPS Take 1,000 mg by mouth daily.        Marland Kitchen omeprazole (PRILOSEC) 20 MG capsule Take 20 mg by mouth. 1 by mouth every other day      . pyridOXINE (VITAMIN B-6) 50 MG tablet Take 50 mg by mouth daily.      Marland Kitchen venlafaxine XR (EFFEXOR-XR) 75 MG 24 hr capsule Take 75 mg by mouth. 1 by mouth every other day        SURGICAL HISTORY:  Past Surgical History  Procedure Date  . Nasal septum surgery   . Thyroglossal duct cyst   . Abdominal hysterectomy   . Breast surgery 01/13/11    bilateral total mastectomy; Dr Derrell Lolling  . Bilateral breast reconstruction Jan  2013  . Reconstruction / correction of nipple / aerola June 2013  . Colonoscopy 2013    Dr Juanda Chance; diverticulosis    REVIEW OF SYSTEMS:  Pertinent items are noted in HPI.   PHYSICAL EXAMINATION: General appearance: alert, cooperative and appears stated age Neck: no adenopathy, no carotid bruit, no JVD, supple, symmetrical, trachea midline and thyroid not enlarged, symmetric, no tenderness/mass/nodules Lymph nodes: Cervical, supraclavicular, and axillary nodes normal. Resp: clear to auscultation bilaterally and normal percussion bilaterally Back: symmetric, no curvature. ROM normal. No CVA tenderness. Cardio: regular rate and rhythm, S1, S2 normal, no murmur, click, rub or gallop GI: soft, non-tender; bowel sounds normal; no masses,  no organomegaly Extremities: extremities normal, atraumatic, no cyanosis or edema Neurologic: Alert and oriented X 3, normal strength and tone. Normal symmetric reflexes. Normal coordination and gait Bilateral breast examination: Patient has reconstructed breasts there is no evidence of skin changes well healed surgical scars. ECOG PERFORMANCE STATUS: 0 - Asymptomatic  Blood pressure 147/80, pulse 70, temperature 98.3 F (36.8 C), resp. rate 20, height 4\' 9"  (1.448 m), weight 111 lb 11.2 oz (50.667 kg).  LABORATORY DATA: Lab Results  Component Value Date   WBC 3.8* 06/08/2012   HGB 13.0 06/08/2012   HCT 39.1 06/08/2012   MCV 92.5 06/08/2012   PLT 214 06/08/2012      Chemistry      Component Value Date/Time   NA 142 12/23/2011 1055   K 4.4 12/23/2011 1055   CL 106 12/23/2011 1055   CO2 29 12/23/2011 1055   BUN 19 12/23/2011 1055   CREATININE 0.66 12/23/2011 1055      Component Value Date/Time   CALCIUM 9.4 12/23/2011 1055   ALKPHOS 42 12/23/2011 1055   AST 22 12/23/2011 1055   ALT 27 12/23/2011 1055   BILITOT 0.6 12/23/2011 1055       RADIOGRAPHIC STUDIES:  No results found.  ASSESSMENT: 67 year old female with  #1 bilateral breast cancer status post  bilateral mastectomies. She has bilateral reconstruction done. She is currently on letrozole 2.5 mg daily and is tolerating it quite well.Patient has no evidence of recurrent disease.   PLAN:   #1 patient will continue the letrozole on a daily basis for a total of 5 years.  #2 she will return in 6 months time for followup.   All questions were answered. The patient knows to call the clinic with any problems, questions or concerns. We can certainly see the patient much sooner if necessary.  I spent >15 minutes counseling the patient face to face. The total time spent in the  appointment was 30 minutes.    Drue Second, MD Medical/Oncology [facility] [phone] (beeper) [phone] (Office)  06/08/2012, 1:03 PM

## 2012-06-26 ENCOUNTER — Telehealth: Payer: Self-pay | Admitting: Internal Medicine

## 2012-06-26 NOTE — Telephone Encounter (Signed)
Patient Information:  Caller Name: Jatia  Phone: [phone]  Patient: Courtney [name]  Gender: Female  DOB: [birth year]  Age: 67 Years  PCP: [name]  Office Follow Up:  Does the office need to follow up with this patient?: No  Instructions For The Office: N/A   Symptoms  Reason For Call & Symptoms: Calling about starting with a cold sx on 06/14/12 and on 06/19/12 mucos started looking yellowish tan and she has facial pain and occasional loose cough with colored mucos.   Reviewed Health History In EMR: Yes  Reviewed Medications In EMR: Yes  Reviewed Allergies In EMR: Yes  Reviewed Surgeries / Procedures: Yes  Date of Onset of Symptoms: 06/14/2012  Treatments Tried: neti pot, muscinex, Asparitox  Treatments Tried Worked: No  Guideline(s) Used:  Colds  Disposition Per Guideline:   See Today or Tomorrow in Office  Reason For Disposition Reached:   Sinus congestion (pressure, fullness) present > 10 days  Advice Given:  For a Runny Nose With Profuse Discharge:   Nasal mucus and discharge helps to wash viruses and bacteria out of the nose and sinuses.  Blowing the nose is all that is needed.  If the skin around your nostrils gets irritated, apply a tiny amount of petroleum ointment to the nasal openings once or twice a day.  For a Stuffy Nose - Use Nasal Washes:  Introduction: Saline (salt water) nasal irrigation (nasal wash) is an effective and simple home remedy for treating stuffy nose and sinus congestion. The nose can be irrigated by pouring, spraying, or squirting salt water into the nose and then letting it run back out.  Expected Course:   Fever may last 2-3 days  Nasal discharge 7-14 days  Cough up to 2-3 weeks.  Call Back If:  Difficulty breathing occurs  Fever lasts more than 3 days  Nasal discharge lasts more than 10 days  Cough lasts more than 3 weeks  You become worse  Appointment Scheduled:  06/27/2012 10:00:00 Appointment Scheduled Provider:  [name]

## 2012-06-26 NOTE — Telephone Encounter (Signed)
Appointment Scheduled:  06/27/2012 10:00:00  Appointment Scheduled Provider:  [name]

## 2012-06-27 ENCOUNTER — Encounter: Payer: Self-pay | Admitting: Internal Medicine

## 2012-06-27 ENCOUNTER — Ambulatory Visit (INDEPENDENT_AMBULATORY_CARE_PROVIDER_SITE_OTHER): Payer: Medicare Other | Admitting: Internal Medicine

## 2012-06-27 VITALS — BP 118/66 | HR 84 | Temp 98.6°F | Resp 12 | Wt 111.6 lb

## 2012-06-27 DIAGNOSIS — J0100 Acute maxillary sinusitis, unspecified: Secondary | ICD-10-CM

## 2012-06-27 DIAGNOSIS — R040 Epistaxis: Secondary | ICD-10-CM

## 2012-06-27 MED ORDER — CEFUROXIME AXETIL 500 MG PO TABS: 500.0000 mg | ORAL_TABLET | Freq: Two times a day (BID) | ORAL | Status: DC

## 2012-06-27 MED ORDER — FLUTICASONE PROPIONATE 50 MCG/ACT NA SUSP: 1.0000 | Freq: Two times a day (BID) | NASAL | Status: DC | PRN

## 2012-06-27 NOTE — Progress Notes (Signed)
  Subjective:    Patient ID: [name], female    DOB: 12-22-1945, 67 y.o.   MRN: [id number]  HPI  The respiratory tract symptoms began 06/16/12 as sneezing,rhinitis, & head congestion..   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  Significant active  associated symptoms include L facial pain & periorbital discomfort,  nasal purulence,intermittent epistaxis  & cough.Cough is associated with clear- yellow production . Low grade fever & chills w/o  sweats were present   Extrinsic symptoms of itchy/ watery eyes also present.    Flu shot current   Treatment with Asparatox,  Neti pot, Advil,& Mucinex, & Symbicort  was partially effective. There is no history of asthma but post infectious RAD.No  seasonal or perennial allergies.  The patient had quit smoking in college.               Review of Systems Symptoms not present include frontal headache,  dental pain, earache, &  otic discharge.  Wheezing  & dyspnea not reported.  Myalgias and arthralgias were not present.        Objective:   Physical Exam  General appearance:good health ;well nourished; no acute distress or increased work of breathing is present.   Eyes: No conjunctival inflammation or lid edema is present. EOMI & vision normal without lenses Ears:  External ear exam shows no significant lesions or deformities.  Otoscopic examination reveals clear canals, tympanic membranes are intact bilaterally without bulging, retraction, inflammation or discharge. Nose:  External nasal examination shows no deformity or inflammation. Nasal mucosa are dry & erythematous without lesions or exudates. No septal dislocation or deviation.No obstruction to airflow.  Oral exam: Dental hygiene is good; lips and gums are healthy appearing.There is no oropharyngeal erythema or exudate noted.  Neck:  No deformities, masses, or tenderness noted.   Heart:  Normal rate and regular rhythm. S1 and S2 normal  without gallop, click, rub or murmur.  Lungs:Chest clear to auscultation; no wheezes, rhonchi,rales ,or rubs present.No increased work of breathing.  See pulse ox ; fingers cold Extremities:  No cyanosis, edema, or clubbing  noted  No  lymphadenopathy about the head, neck, or axilla noted.  Skin: Warm & dry         Assessment & Plan:  #1 maxillary sinusitis #2epistaxis Plan: See orders and recommendations

## 2012-06-27 NOTE — Patient Instructions (Addendum)
Plain Mucinex (NOT D) for thick secretions ;force NON dairy fluids .   Nasal cleansing in the shower as discussed with lather of mild shampoo.After 10 seconds wash off lather while  exhaling through nostrils. Make sure that all residual soap is removed to prevent irritation.  Fluticasone 1 spray in each nostril twice a day as needed. Use the "crossover" technique into opposite nostril spraying toward opposite ear @ 45 degree angle, not straight up into nostril.  Use a Neti pot daily only  as needed for significant sinus congestion; going from open side to congested side . Plain Allegra (NOT D )  160 daily , Loratidine 10 mg , OR Zyrtec 10 mg @ bedtime  as needed for itchy eyes & sneezing.  Use Eucerin   twice a day  for the  Septal drying.

## 2012-07-01 ENCOUNTER — Other Ambulatory Visit: Payer: Self-pay | Admitting: Internal Medicine

## 2012-09-01 ENCOUNTER — Other Ambulatory Visit: Payer: Self-pay | Admitting: Internal Medicine

## 2012-09-06 IMAGING — CR DG CHEST 2V
2 series · 2 of 2 positions shown · non-contrast
Comparison: Report of remote prior exam 09/17/2002.  Images are not
digitized.

CLINICAL DATA: Shortness of breath and cough

CHEST - 2 VIEW

[view not recorded (1 of 2)]
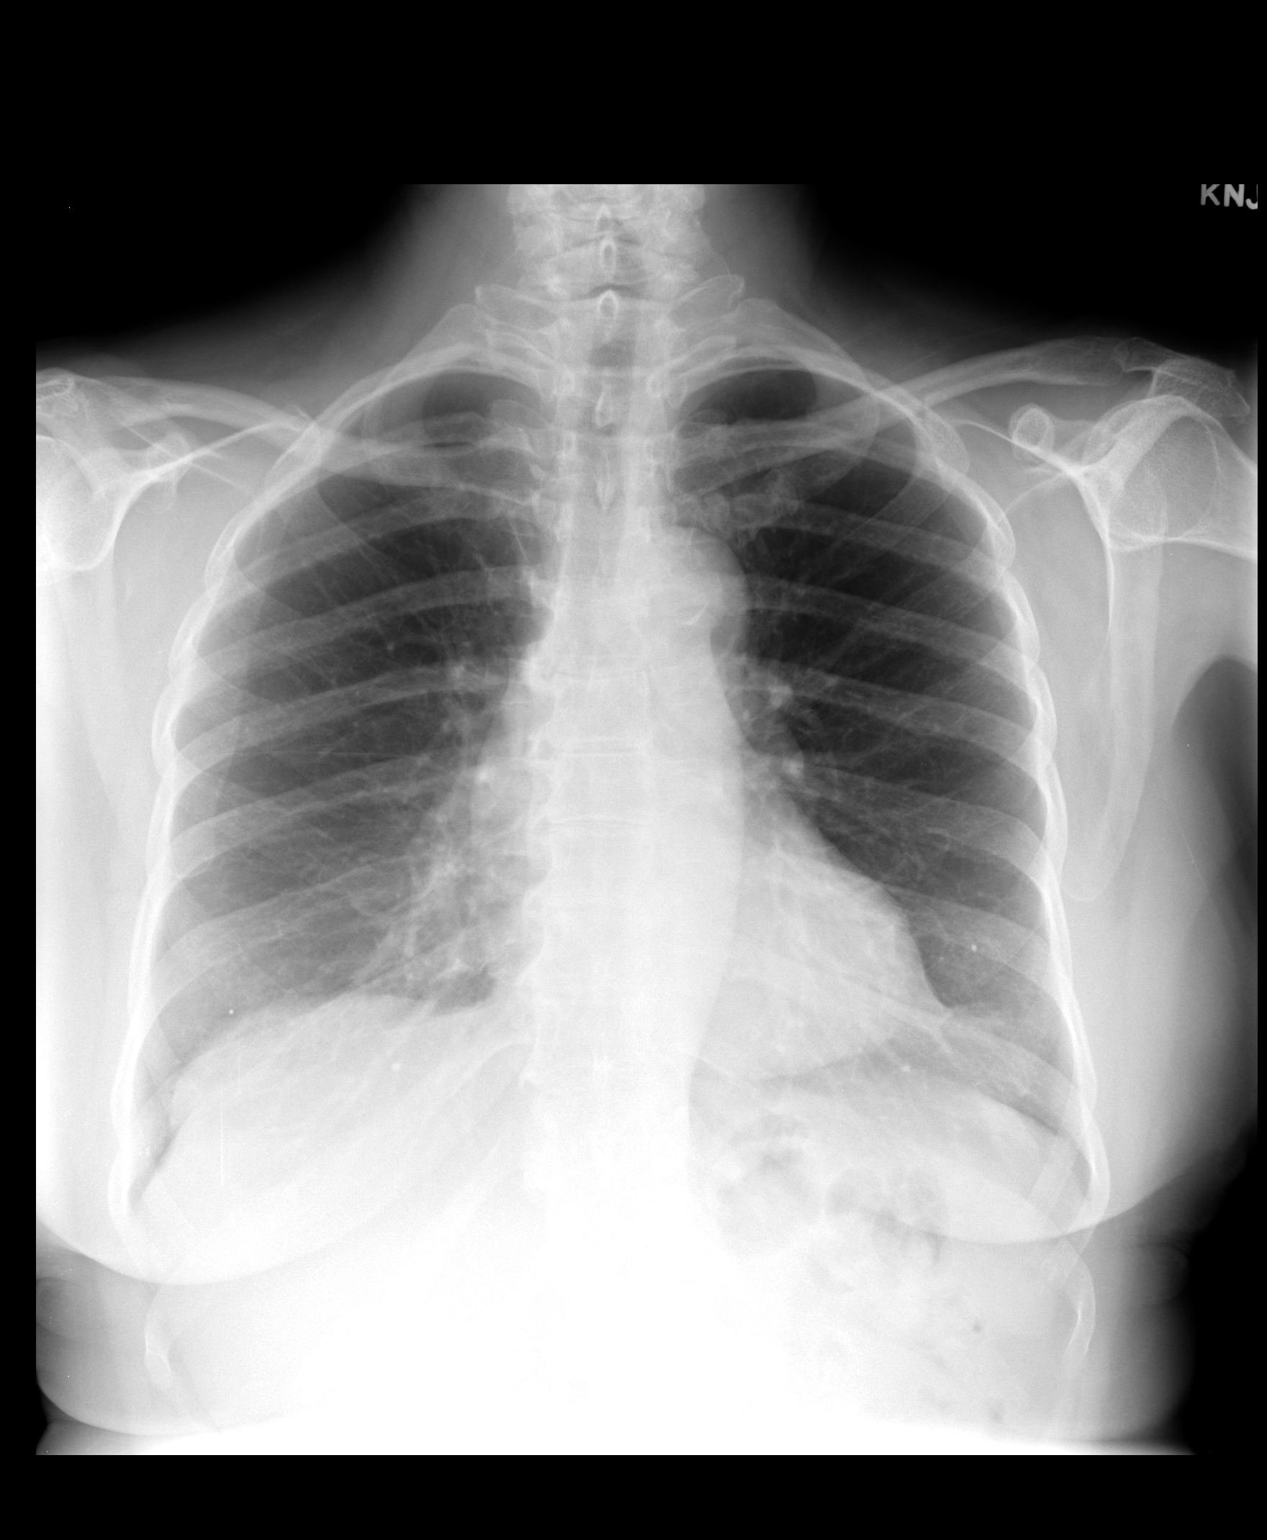

[view not recorded (2 of 2)]
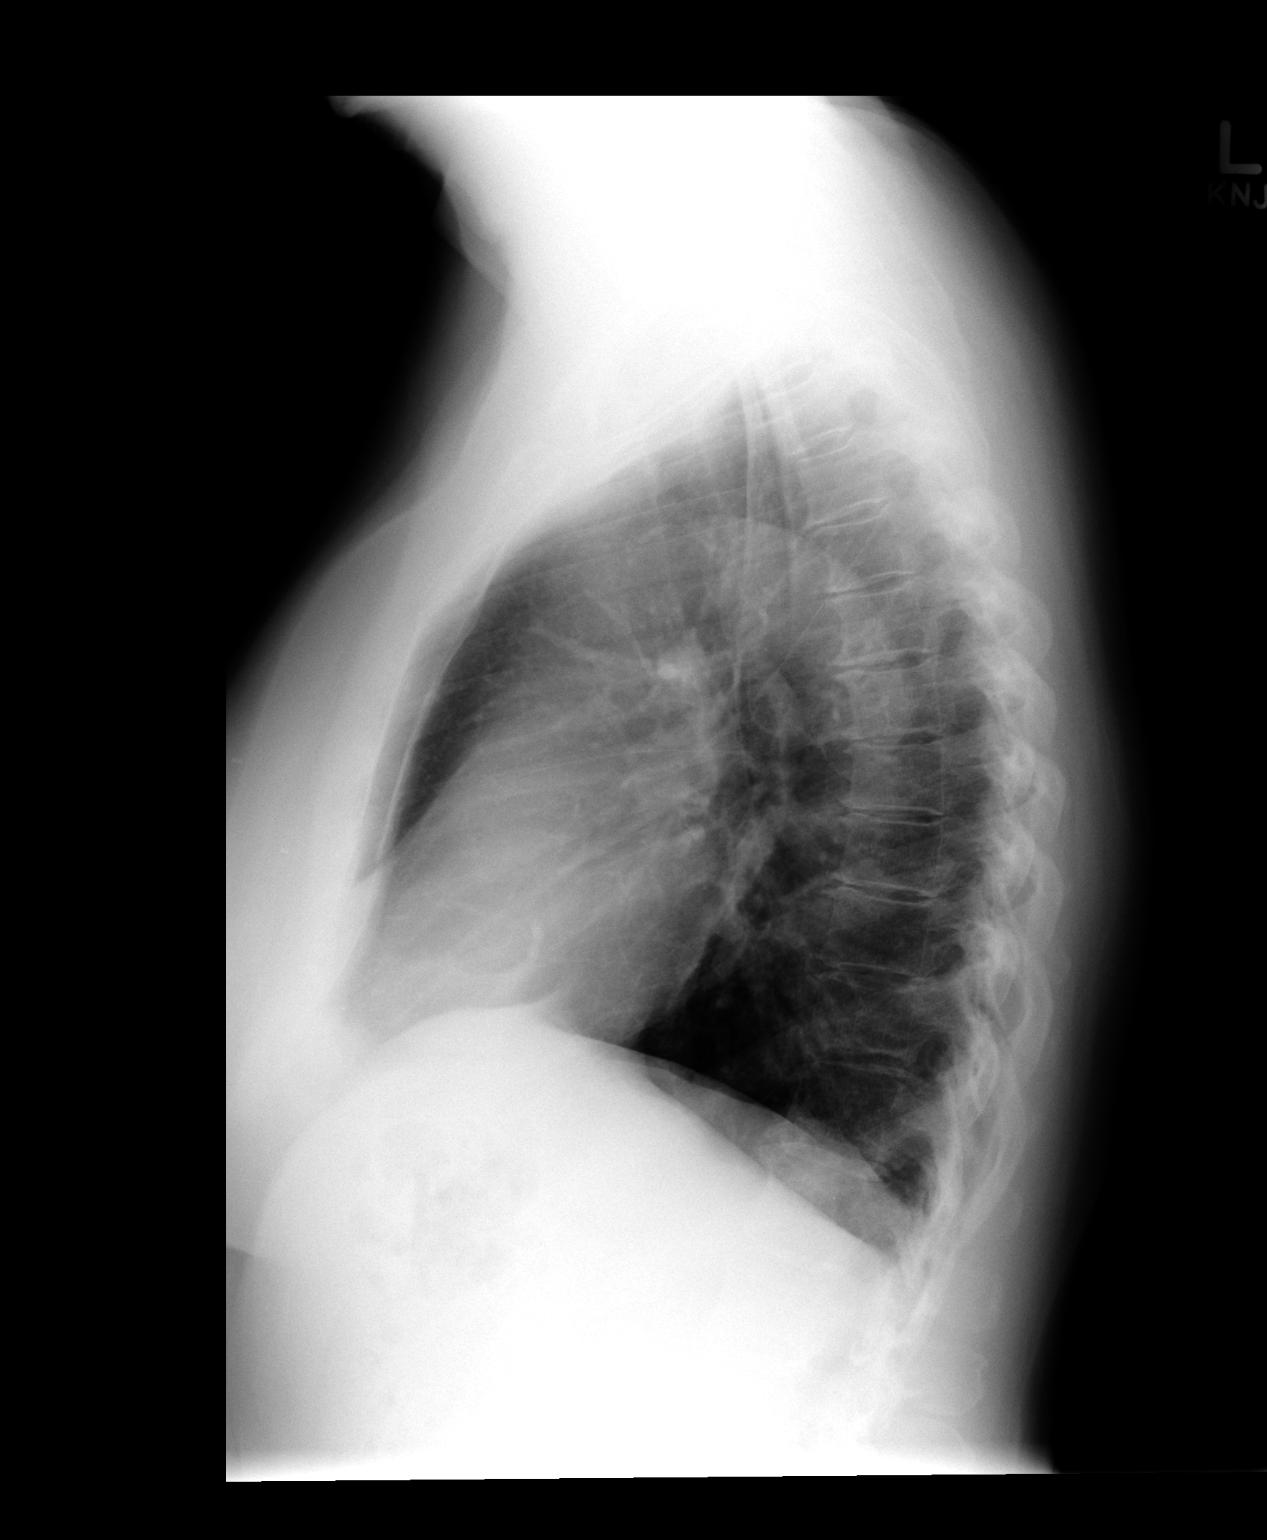

[2 of 2 positions shown; findings below may reference images not displayed]

FINDINGS: Clips from recent breast biopsy are noted bilaterally.
The lungs are clear.  Heart size is normal.  No pleural effusion.
No acute osseous finding. Aorta is ectatic and unfolded.
IMPRESSION: No acute cardiopulmonary process.

## 2012-10-20 ENCOUNTER — Other Ambulatory Visit: Payer: Self-pay | Admitting: Family Medicine

## 2012-10-25 ENCOUNTER — Other Ambulatory Visit: Payer: Self-pay | Admitting: Internal Medicine

## 2012-10-25 DIAGNOSIS — R05 Cough: Secondary | ICD-10-CM

## 2012-10-25 MED ORDER — MONTELUKAST SODIUM 10 MG PO TABS: 10.0000 mg | ORAL_TABLET | Freq: Every day | ORAL | Status: DC

## 2012-12-06 ENCOUNTER — Other Ambulatory Visit: Payer: Self-pay

## 2013-01-15 ENCOUNTER — Telehealth: Payer: Self-pay | Admitting: Oncology

## 2013-01-18 ENCOUNTER — Encounter: Payer: Self-pay | Admitting: Oncology

## 2013-01-18 ENCOUNTER — Telehealth: Payer: Self-pay | Admitting: *Deleted

## 2013-01-18 ENCOUNTER — Ambulatory Visit (HOSPITAL_BASED_OUTPATIENT_CLINIC_OR_DEPARTMENT_OTHER): Payer: Medicare Other | Admitting: Oncology

## 2013-01-18 ENCOUNTER — Ambulatory Visit (HOSPITAL_BASED_OUTPATIENT_CLINIC_OR_DEPARTMENT_OTHER): Payer: Medicare Other | Admitting: Lab

## 2013-01-18 VITALS — BP 136/79 | HR 73 | Temp 98.2°F | Resp 20 | Ht <= 58 in | Wt 113.9 lb

## 2013-01-18 DIAGNOSIS — Z853 Personal history of malignant neoplasm of breast: Secondary | ICD-10-CM

## 2013-01-18 DIAGNOSIS — C50419 Malignant neoplasm of upper-outer quadrant of unspecified female breast: Secondary | ICD-10-CM

## 2013-01-18 LAB — CBC WITH DIFFERENTIAL/PLATELET
BASO%: 2 % (ref 0.0–2.0)
EOS%: 1.7 % (ref 0.0–7.0)
HCT: 40.7 % (ref 34.8–46.6)
LYMPH%: 31.8 % (ref 14.0–49.7)
MCH: 30.7 pg (ref 25.1–34.0)
MCHC: 33.3 g/dL (ref 31.5–36.0)
MONO#: 0.3 10*3/uL (ref 0.1–0.9)
MONO%: 7.9 % (ref 0.0–14.0)
NEUT%: 56.6 % (ref 38.4–76.8)
Platelets: 213 10*3/uL (ref 145–400)
RBC: 4.42 10*6/uL (ref 3.70–5.45)
WBC: 4 10*3/uL (ref 3.9–10.3)

## 2013-01-18 LAB — COMPREHENSIVE METABOLIC PANEL (CC13)
ALT: 46 U/L (ref 0–55)
AST: 31 U/L (ref 5–34)
Alkaline Phosphatase: 57 U/L (ref 40–150)
Creatinine: 0.7 mg/dL (ref 0.6–1.1)
Sodium: 144 mEq/L (ref 136–145)
Total Bilirubin: 0.97 mg/dL (ref 0.20–1.20)
Total Protein: 7.9 g/dL (ref 6.4–8.3)

## 2013-01-18 NOTE — Telephone Encounter (Signed)
appts made and printed...td 

## 2013-01-18 NOTE — Patient Instructions (Addendum)
Continue letrozole 2.5 mg daily  We will see you back in 6 months 

## 2013-01-21 NOTE — Progress Notes (Signed)
OFFICE PROGRESS NOTE  CC  [name], MD [phone] [facility] [address] [place] [address] Dr. Claud Kelp Dr. Chipper Herb Dr. Karma Lew  DIAGNOSIS: 67 year old female with invasive ductal carcinoma with DCIS of the left breast. She also was found to have right breast cancer.  PRIOR THERAPY:  #1 status post bilateral mastectomies on 01/13/2011. Patient had a left simple mastectomy that revealed 2 foci of invasive ductal carcinoma one measuring 1.2 cm and a second focus measuring 0.5 cm grade 2 with associated DCIS and LV I. 8 sentinel nodes were negative for metastatic disease. Patient had a right simple mastectomy that showed a 1.3 in centimeter invasive ductal carcinoma grade 2 with associated DCIS no LV I. One sentinel node was negative for metastatic disease. Tumor #1 was estrogen receptor +100% progesterone receptor 100% proliferation marker 14% HER-2/neu negative. Tumor #2 was ER +100% PR +98% proliferation marker 13% and HER-2/neu negative.  #2 patient Had Oncotype DX performed on the largest tumor at the score was 10 giving her a 7% risk of recurrence at 5 years with antiestrogen therapy only.  #3 patient has had bilateral reconstruction performed.She is to have nipple reconstructed in a few months.  #4 patient was started on Letrozole mg daily in October 2012. A total of 5 years as planned.  CURRENT THERAPY: Letrozole 2.5 mg daily  INTERVAL HISTORY: Courtney [name] 67 y.o. female returns for Followup visit today. Overall she is doing well. She has tolerated the letrozole without any significant problems. She denies any fevers chills night sweats headaches shortness of breath chest pains palpitations no myalgias or arthralgias. She has completed her reconstruction and she is quite pleased with the results. Remainder of the 10 point review of systems is negative.  MEDICAL HISTORY: Past Medical History  Diagnosis Date  . Fatigue   . Cough   . GERD  (gastroesophageal reflux disease)   . Asthma   . Bronchitis   . Night sweats   . Family history of breast cancer   . Hypertension   . Breast cancer July 2012    double mastectomy; Dr Welton Flakes  . Diverticulosis     ALLERGIES:  has No Known Allergies.  MEDICATIONS:  Current Outpatient Prescriptions  Medication Sig Dispense Refill  . amLODipine (NORVASC) 5 MG tablet Take 1 tablet (5 mg total) by mouth daily.  30 tablet  11  . aspirin 81 MG tablet Take 81 mg by mouth daily.        . Azelaic Acid (FINACEA EX) Apply topically as directed.      . budesonide-formoterol (SYMBICORT) 160-4.5 MCG/ACT inhaler 1-2 puffs every 12 hours. Gargle and spit after use  1 Inhaler  2  . calcium-vitamin D (OSCAL WITH D) 500-200 MG-UNIT per tablet Take 4 tablets by mouth daily.        . Cholecalciferol (VITAMIN D3) 1000 UNITS CAPS Take by mouth daily.        Marland Kitchen co-enzyme Q-10 30 MG capsule Take 50 mg by mouth daily.       . fluticasone (FLONASE) 50 MCG/ACT nasal spray Place 1 spray into the nose 2 (two) times daily as needed for rhinitis.  16 g  11  . Ginseng (ELEUTHERO PO) Take by mouth as directed.      Marland Kitchen HORSE CHESTNUT PO Take 250 mg by mouth daily.        Marland Kitchen letrozole (FEMARA) 2.5 MG tablet Take 2.5 mg by mouth daily.       Marland Kitchen  losartan-hydrochlorothiazide (HYZAAR) 100-12.5 MG per tablet TAKE ONE TABLET BY MOUTH ONE TIME DAILY  30 tablet  6  . Multiple Vitamins-Minerals (MULTIVITAMIN WITH MINERALS) tablet Take 2 tablets by mouth daily.        . OMEGA 3 1000 MG CAPS Take 1,000 mg by mouth daily.        Marland Kitchen omeprazole (PRILOSEC) 20 MG capsule TAKE ONE CAPSULE BY MOUTH TWICE DAILY  60 capsule  5  . pyridOXINE (VITAMIN B-6) 50 MG tablet Take 50 mg by mouth daily.      . montelukast (SINGULAIR) 10 MG tablet Take 1 tablet (10 mg total) by mouth at bedtime.  30 tablet  3  . tretinoin (RETIN-A) 0.025 % cream Apply topically at bedtime.       No current facility-administered medications for this visit.    SURGICAL  HISTORY:  Past Surgical History  Procedure Laterality Date  . Nasal septum surgery    . Thyroglossal duct cyst    . Abdominal hysterectomy    . Breast surgery  01/13/11    bilateral total mastectomy; Dr Derrell Lolling  . Bilateral breast reconstruction  Jan 2013  . Reconstruction / correction of nipple / aerola  June 2013  . Colonoscopy  2013    Dr Juanda Chance; diverticulosis    REVIEW OF SYSTEMS:  Pertinent items are noted in HPI.   PHYSICAL EXAMINATION: General appearance: alert, cooperative and appears stated age Neck: no adenopathy, no carotid bruit, no JVD, supple, symmetrical, trachea midline and thyroid not enlarged, symmetric, no tenderness/mass/nodules Lymph nodes: Cervical, supraclavicular, and axillary nodes normal. Resp: clear to auscultation bilaterally and normal percussion bilaterally Back: symmetric, no curvature. ROM normal. No CVA tenderness. Cardio: regular rate and rhythm, S1, S2 normal, no murmur, click, rub or gallop GI: soft, non-tender; bowel sounds normal; no masses,  no organomegaly Extremities: extremities normal, atraumatic, no cyanosis or edema Neurologic: Alert and oriented X 3, normal strength and tone. Normal symmetric reflexes. Normal coordination and gait Bilateral breast examination: Patient has reconstructed breasts there is no evidence of skin changes well healed surgical scars. ECOG PERFORMANCE STATUS: 0 - Asymptomatic  Blood pressure 136/79, pulse 73, temperature 98.2 F (36.8 C), temperature source Oral, resp. rate 20, height 4\' 9"  (1.448 m), weight 113 lb 14.4 oz (51.665 kg).  LABORATORY DATA: Lab Results  Component Value Date   WBC 4.0 01/18/2013   HGB 13.6 01/18/2013   HCT 40.7 01/18/2013   MCV 92.2 01/18/2013   PLT 213 01/18/2013      Chemistry      Component Value Date/Time   NA 144 01/18/2013 1225   NA 142 12/23/2011 1055   K 4.1 01/18/2013 1225   K 4.4 12/23/2011 1055   CL 104 06/08/2012 1202   CL 106 12/23/2011 1055   CO2 28 01/18/2013 1225    CO2 29 12/23/2011 1055   BUN 14.8 01/18/2013 1225   BUN 19 12/23/2011 1055   CREATININE 0.7 01/18/2013 1225   CREATININE 0.66 12/23/2011 1055      Component Value Date/Time   CALCIUM 10.1 01/18/2013 1225   CALCIUM 9.4 12/23/2011 1055   ALKPHOS 57 01/18/2013 1225   ALKPHOS 42 12/23/2011 1055   AST 31 01/18/2013 1225   AST 22 12/23/2011 1055   ALT 46 01/18/2013 1225   ALT 27 12/23/2011 1055   BILITOT 0.97 01/18/2013 1225   BILITOT 0.6 12/23/2011 1055       RADIOGRAPHIC STUDIES:  No results found.  ASSESSMENT: 67 year old female with  #  1 bilateral breast cancer status post bilateral mastectomies. She has bilateral reconstruction done. She is currently on letrozole 2.5 mg daily and is tolerating it quite well.Patient has no evidence of recurrent disease.   PLAN:   #1 patient will continue the letrozole on a daily basis for a total of 5 years.  #2 she will return in 6 months time for followup.   All questions were answered. The patient knows to call the clinic with any problems, questions or concerns. We can certainly see the patient much sooner if necessary.  I spent >15 minutes counseling the patient face to face. The total time spent in the appointment was 30 minutes.    Drue Second, MD Medical/Oncology [facility] [phone] (beeper) [phone] (Office)

## 2013-01-30 ENCOUNTER — Telehealth (INDEPENDENT_AMBULATORY_CARE_PROVIDER_SITE_OTHER): Payer: Self-pay

## 2013-01-30 NOTE — Telephone Encounter (Signed)
Message copied by Ivory Broad on Tue Jan 30, 2013  1:46 PM ------      Message from: Marin Shutter      Created: Tue Jan 30, 2013  1:39 PM      Regarding: Dr. Derrell Lolling       Calling [facility] for the month of Nov.  Pt told me she doesn't need to see Dr. Derrell Lolling anymore, since she sees Dr. Welton Flakes every 6 months.  She wanted to thank Dr. Derrell Lolling for the 'excellent care she received'.  Thx ------

## 2013-01-30 NOTE — Telephone Encounter (Signed)
Dr Derrell Lolling:  See message below.

## 2013-01-31 ENCOUNTER — Other Ambulatory Visit: Payer: Self-pay | Admitting: Internal Medicine

## 2013-02-01 NOTE — Telephone Encounter (Signed)
Rx sent to the pharmacy by e-script alone with a note that the pt is due labs and CPE.//AB/CMA

## 2013-02-13 ENCOUNTER — Other Ambulatory Visit: Payer: Self-pay | Admitting: Internal Medicine

## 2013-02-13 NOTE — Telephone Encounter (Signed)
monteluklast refill sent to pharmacy

## 2013-02-16 ENCOUNTER — Encounter: Payer: Self-pay | Admitting: Internal Medicine

## 2013-02-16 ENCOUNTER — Ambulatory Visit (INDEPENDENT_AMBULATORY_CARE_PROVIDER_SITE_OTHER): Payer: Medicare Other | Admitting: Internal Medicine

## 2013-02-16 VITALS — BP 129/74 | HR 65 | Temp 98.0°F | Ht <= 58 in | Wt 114.8 lb

## 2013-02-16 DIAGNOSIS — M899 Disorder of bone, unspecified: Secondary | ICD-10-CM

## 2013-02-16 DIAGNOSIS — J209 Acute bronchitis, unspecified: Secondary | ICD-10-CM

## 2013-02-16 DIAGNOSIS — M8580 Other specified disorders of bone density and structure, unspecified site: Secondary | ICD-10-CM

## 2013-02-16 DIAGNOSIS — Z23 Encounter for immunization: Secondary | ICD-10-CM

## 2013-02-16 DIAGNOSIS — E785 Hyperlipidemia, unspecified: Secondary | ICD-10-CM

## 2013-02-16 DIAGNOSIS — M25562 Pain in left knee: Secondary | ICD-10-CM

## 2013-02-16 DIAGNOSIS — Z0000 Encounter for general adult medical examination without abnormal findings: Secondary | ICD-10-CM

## 2013-02-16 DIAGNOSIS — M25569 Pain in unspecified knee: Secondary | ICD-10-CM

## 2013-02-16 DIAGNOSIS — I10 Essential (primary) hypertension: Secondary | ICD-10-CM

## 2013-02-16 DIAGNOSIS — R7309 Other abnormal glucose: Secondary | ICD-10-CM | POA: Insufficient documentation

## 2013-02-16 LAB — LIPID PANEL
Cholesterol: 232 mg/dL — ABNORMAL HIGH (ref 0–200)
HDL: 71 mg/dL (ref >39)
Total CHOL/HDL Ratio: 3.3 Ratio
Triglycerides: 77 mg/dL (ref <150)
VLDL: 15 mg/dL (ref 0–40)

## 2013-02-16 MED ORDER — CEFUROXIME AXETIL 500 MG PO TABS: 500.0000 mg | ORAL_TABLET | Freq: Two times a day (BID) | ORAL | Status: DC

## 2013-02-16 NOTE — Addendum Note (Signed)
Addended by: Silvio Pate D on: 02/16/2013 03:54 PM   Modules accepted: Orders

## 2013-02-16 NOTE — Patient Instructions (Signed)
Plain Mucinex (NOT D) for thick secretions ;force NON dairy fluids .   Nasal cleansing in the shower as discussed with lather of mild shampoo.After 10 seconds wash off lather while  exhaling through nostrils. Make sure that all residual soap is removed to prevent irritation.  Nasacort 1 spray in each nostril twice a day as needed. Use the "crossover" technique into opposite nostril spraying toward opposite ear @ 45 degree angle, not straight up into nostril.  Use a Neti pot daily only  as needed for significant sinus congestion; going from open side to congested side . Plain Allegra (NOT D )  160 daily , Loratidine 10 mg , OR Zyrtec 10 mg @ bedtime  as needed for itchy eyes & sneezing. Minimal Blood Pressure Goal= AVERAGE < 140/90;  Ideal is an AVERAGE < 135/85. This AVERAGE should be calculated from @ least 5-7 BP readings taken @ different times of day on different days of week. You should not respond to isolated BP readings , but rather the AVERAGE for that week .Please bring your  blood pressure cuff to office visits to verify that it is reliable.It  can also be checked against the blood pressure device at the pharmacy. Finger or wrist cuffs are not dependable; an arm cuff is.

## 2013-02-16 NOTE — Progress Notes (Signed)
Subjective:    Patient ID: Courtney [name], female    DOB: 1945/10/04, 67 y.o.   MRN: [id number]  HPI  Medicare Wellness Visit: Psychosocial and medical history were reviewed as required by Medicare (abuse, antisocial behavior risk, forearm risk). Social history: Caffeine: 1-2 cups / day , Alcohol:14 drinks/ week  , Tobacco WUJ:WJXB 1967 Exercise:see below Personal safety/fall risk:no Limitations of activities of daily living:no Seatbelt smoke alarm use:yes Healthcare Power of Attorney/Living Will status: in place Ophthalmologic exam status:current Hearing evaluation status:not current Orientation: Oriented X3 Memory and recall: good Spelling  testing: good Depression/anxiety assessment: denied Foreign travel history:[place] 2010 Immunization status the shingles/bleeding/pneumonia/tetanus:PNA needed; Flu today Transfusion history:no Preventive health care maintenance status: Colonoscopy/BMD/mammogram/Pap as per protocol/standard care:current Dental care:every 6 mos Chart reviewed and updated. Active issues reviewed and addressed.    Review of Systems She is on a heart healthy diet; she exercises as water aerobics & yoga 5-6 X/ week without symptoms. Specifically she denies chest pain, palpitations, dyspnea, or claudication.  Family history is negative for premature coronary disease . Advanced cholesterol testing reveals her LDL goal is less than 115. No statin to date.  BP @home  110/60-147/88. Pain L knee upon standing for 3-4 months.  She's had pressure in the left facial sinus approximately a week. She denies any purulent nasal secretions. She has no fever, chills, sweats. For the last 48 hours she has had some purulent sputum. .     Objective:   Physical Exam  Gen.: Healthy and well-nourished in appearance. Alert, appropriate and cooperative throughout exam.Appears younger than stated age  Head: Normocephalic without obvious abnormalities Eyes: No corneal or conjunctival  inflammation noted. Pupils equal round reactive to light and accommodation.  Extraocular motion intact.  Ears: External  ear exam reveals no significant lesions or deformities. Canals clear .TMs normal. Hearing is grossly normal bilaterally. Nose: External nasal exam reveals no deformity or inflammation. Nasal mucosa are pink and moist. No lesions or exudates noted.   Mouth: Oral mucosa and oropharynx reveal no lesions or exudates. Teeth in good repair. Neck: No deformities, masses, or tenderness noted. Range of motion & Thyroid  normal. Lungs: Normal respiratory effort; chest expands symmetrically. Lungs are clear to auscultation without rales, wheezes, or increased work of breathing. Heart: Normal rate and rhythm. Normal S1 and S2. No gallop, click, or rub. No murmur. Abdomen: Bowel sounds normal; abdomen soft and nontender. No masses, organomegaly or hernias noted. Genitalia: As per Gyn                                  Musculoskeletal/extremities: No deformity or scoliosis noted of  the thoracic or lumbar spine.   No clubbing, cyanosis, edema, or significant extremity  deformity noted. Range of motion normal; pooping R knee .Tone & strength  Normal. Joints normal . Nail health good. Able to lie down & sit up w/o help. Negative SLR bilaterally Vascular: Carotid, radial artery, dorsalis pedis and  posterior tibial pulses are full and equal. No bruits present. Neurologic: Alert and oriented x3. Deep tendon reflexes symmetrical and normal.         Skin: Intact without suspicious lesions or rashes. Lymph: No cervical, axillary lymphadenopathy present. Psych: Mood and affect are normal. Normally interactive  Assessment & Plan:  #1 Medicare Wellness Exam; criteria met ; data entered #2 Problem List/Diagnoses reviewed #3 acute bronchitis #4 knee pain on L ; non skeletal Plan:  Assessments made/ Orders  entered

## 2013-02-17 LAB — HEMOGLOBIN A1C: Mean Plasma Glucose: 120 mg/dL — ABNORMAL HIGH (ref <117)

## 2013-02-17 LAB — TSH: TSH: 1.88 u[IU]/mL (ref 0.350–4.500)

## 2013-02-19 ENCOUNTER — Ambulatory Visit (INDEPENDENT_AMBULATORY_CARE_PROVIDER_SITE_OTHER): Payer: Medicare Other | Admitting: Family Medicine

## 2013-02-19 ENCOUNTER — Encounter: Payer: Self-pay | Admitting: Family Medicine

## 2013-02-19 VITALS — BP 126/74 | HR 66 | Wt 117.0 lb

## 2013-02-19 DIAGNOSIS — IMO0002 Reserved for concepts with insufficient information to code with codable children: Secondary | ICD-10-CM

## 2013-02-19 DIAGNOSIS — S76312A Strain of muscle, fascia and tendon of the posterior muscle group at thigh level, left thigh, initial encounter: Secondary | ICD-10-CM

## 2013-02-19 DIAGNOSIS — M25569 Pain in unspecified knee: Secondary | ICD-10-CM

## 2013-02-19 MED ORDER — MELOXICAM 15 MG PO TABS: 15.0000 mg | ORAL_TABLET | Freq: Every day | ORAL | Status: DC

## 2013-02-19 NOTE — Patient Instructions (Addendum)
Very nice to meet you You have homework- Do the exercises daily Ice 20 minutes 2 times a day can be helpful.   Hamstring Rehab 1)  HS curls - start with 3 sets of 15 and progress to 3 sets of 30 every 3 days 2)  HS curls with weight - begin with 2 lb ankle weight when above is easy and start with 3 sets of 10; increasing every 5 days by 5 reps - eg to 3 sets of 15 reps 3)  HS swings - swing leg backwards and curl at the end of the swing; follow same schedule as above. 4)  HS running lunges - running lunge position meansrehabha no more than 45 degrees of knee flexion and running motion.  follow same schedule as above. Hip abductor strengthening- Lay on side, lift top leg, hold 2 seconds, then bring down slow for 4 count.  Repate 30 reps each side. Start with 1 set daily first week then 2 sets daily in 2nd week and 3 sets daily therafter.  Hip flexor stretches.  One knee down one up and tilt pelvis forward, hold for 10 second then relaxe, repeat.  Do both sides.  Best to do after activity and beofre bed.  Focus on core strength as well, no crunches.   Also look at handout I am giving you  Wear compression with activity.  Meloxicam daily x 10 days then as needed. Stop if hurt your stomach.  Come back in 3-4 weeks.

## 2013-02-19 NOTE — Progress Notes (Signed)
  I'm seeing this patient by the request  of:  [name], MD   CC: Knee pain  HPI: Patient is a very pleasant 67 year old female with acute onset of left knee pain. Patient states that it did seem to start approximately 3-4 months ago. Patient states that the pain is worse with standing. Patient denies any true injury. Patient continues to exercise with water aerobics and yoga 5-6 times a week. Patient is muscle pain is in the posterior aspect of the knee. States that it seems worse when she is going downstairs or when she gets up in the morning or goes from a seated to standing position. Patient has not tried any home modalities at this time. Patient is a severity of 6/10.  Past medical, surgical, family and social history reviewed. Medications reviewed all in the electronic medical record.   Review of Systems: No headache, visual changes, nausea, vomiting, diarrhea, constipation, dizziness, abdominal pain, skin rash, fevers, chills, night sweats, weight loss, swollen lymph nodes, body aches, joint swelling, muscle aches, chest pain, shortness of breath, mood changes.   Objective:    Blood pressure 126/74, pulse 66, weight 117 lb (53.071 kg), SpO2 97.00%.   General: No apparent distress alert and oriented x3 mood and affect normal, dressed appropriately.  HEENT: Pupils equal, extraocular movements intact Respiratory: Patient's speak in full sentences and does not appear short of breath Cardiovascular: No lower extremity edema, non tender, no erythema Skin: Warm dry intact with no signs of infection or rash on extremities or on axial skeleton. Abdomen: Soft nontender Neuro: Cranial nerves II through XII are intact, neurovascularly intact in all extremities with 2+ DTRs and 2+ pulses. Lymph: No lymphadenopathy of posterior or anterior cervical chain or axillae bilaterally.  Gait normal with good balance and coordination. Does have scoliosis. MSK: Non tender with full range of motion and  good stability and symmetric strength and tone of shoulders, elbows, wrist, hip and ankles bilaterally.  Knee: Left Normal to inspection with no erythema or effusion or obvious bony abnormalities. Palpation normal with no warmth, joint line tenderness, patellar tenderness, or condyle tenderness. ROM full in flexion and extension and lower leg rotation. Ligaments with solid consistent endpoints including ACL, PCL, LCL, MCL. Negative Mcmurray's, Apley's, and Thessalonian tests. Non painful patellar compression. Patellar glide without crepitus. Patellar and quadriceps tendons unremarkable. Hamstring and quadriceps strength 4/5 on right compared to 5/5 on left Contralateral side unremarkable  MSK US performed of: Left knee This study was ordered, performed, and interpreted by Terrilee Files D.O.  Knee: All structures visualized. Anteromedial, anterolateral, posteromedial, and posterolateral menisci unremarkable without tearing, fraying, effusion, or displacement. Patellar Tendon unremarkable on long and transverse views without effusion. No abnormality of prepatellar bursa. LCL and MCL unremarkable on long and transverse views. No abnormality of origin of medial or lateral head of the gastrocnemius. Hamstring tendon no near insertion does have hypoechoic changes with what appears to be a very small avulsion fracture. There is some neovascularization and increased Doppler flow.  IMPRESSION:  Mild hamstring tendinopathy medial side   Impression and Recommendations:     This case required medical decision making of moderate complexity.

## 2013-02-19 NOTE — Assessment & Plan Note (Signed)
Meloxicam daily for 10 days Home exercise program given including psoas exercises, hip abductor exercises and strengthening and hamstring rehabilitation. Knee sleeve given today to help change fulcrum of tendinopathy.  patient will also try icing protocol Come back again in 4 weeks for further evaluation

## 2013-02-22 ENCOUNTER — Ambulatory Visit (INDEPENDENT_AMBULATORY_CARE_PROVIDER_SITE_OTHER): Payer: Medicare Other | Admitting: *Deleted

## 2013-02-22 ENCOUNTER — Ambulatory Visit: Payer: Medicare Other

## 2013-02-22 DIAGNOSIS — Z23 Encounter for immunization: Secondary | ICD-10-CM

## 2013-02-22 LAB — VITAMIN D 1,25 DIHYDROXY: Vitamin D 1, 25 (OH)2 Total: 46 pg/mL (ref 18–72)

## 2013-03-01 ENCOUNTER — Other Ambulatory Visit: Payer: Self-pay | Admitting: Internal Medicine

## 2013-03-01 NOTE — Telephone Encounter (Signed)
Losartan-HCTZ refill sent to pharmacy 

## 2013-03-09 ENCOUNTER — Other Ambulatory Visit: Payer: Self-pay | Admitting: Internal Medicine

## 2013-03-09 NOTE — Telephone Encounter (Signed)
Budesonide-formoterol refill sent to pharamcy

## 2013-03-13 ENCOUNTER — Other Ambulatory Visit: Payer: Self-pay | Admitting: Dermatology

## 2013-03-19 ENCOUNTER — Encounter: Payer: Self-pay | Admitting: Family Medicine

## 2013-03-19 ENCOUNTER — Ambulatory Visit (INDEPENDENT_AMBULATORY_CARE_PROVIDER_SITE_OTHER): Payer: Medicare Other | Admitting: Family Medicine

## 2013-03-19 VITALS — BP 126/78 | HR 72

## 2013-03-19 DIAGNOSIS — S76312D Strain of muscle, fascia and tendon of the posterior muscle group at thigh level, left thigh, subsequent encounter: Secondary | ICD-10-CM

## 2013-03-19 DIAGNOSIS — Z5189 Encounter for other specified aftercare: Secondary | ICD-10-CM

## 2013-03-19 NOTE — Progress Notes (Signed)
Pre-visit discussion using our clinic review tool. No additional management support is needed unless otherwise documented below in the visit note.  

## 2013-03-19 NOTE — Assessment & Plan Note (Signed)
Patient is improving very well with conservative therapy. Patient's will decrease exercises 3 times a week and given more eccentric exercises. Discuss continuing compression Discussed 3 days burst of anti-inflammatories when become more painful. Patient will follow up again in 4-6 weeks for further evaluation.

## 2013-03-19 NOTE — Patient Instructions (Signed)
It is great to see you Continue exercises 3 times a week Continue compression If you get pain do the meloxicam 3 days straight.  Come back again in 4-6 weeks.

## 2013-03-19 NOTE — Progress Notes (Signed)
  CC: Knee pain, followup  HPI: Patient is a very pleasant 67 year old female following up with left knee pain. 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 Patient was given meloxicam, home exercise program, as well as a knee sleeve. Patient states since last visit states she is made approximately 70% improvement. Patient states that she's been able to exercise 5 days a week with swimming exercises as well as yoga. Patient denies any significant pain. Patient also denies any swelling or any radiation of the pain or any weakness. Patient still notices it when she has extend the knee and she goes to flex it. But overall significantly better.  Past medical, surgical, family and social history reviewed. Medications reviewed all in the electronic medical record.   Review of Systems: No headache, visual changes, nausea, vomiting, diarrhea, constipation, dizziness, abdominal pain, skin rash, fevers, chills, night sweats, weight loss, swollen lymph nodes, body aches, joint swelling, muscle aches, chest pain, shortness of breath, mood changes.   Objective:    Blood pressure 126/78, pulse 72, SpO2 98.00%.   General: No apparent distress alert and oriented x3 mood and affect normal, dressed appropriately.  HEENT: Pupils equal, extraocular movements intact Respiratory: Patient's speak in full sentences and does not appear short of breath Cardiovascular: No lower extremity edema, non tender, no erythema Skin: Warm dry intact with no signs of infection or rash on extremities or on axial skeleton. Abdomen: Soft nontender Neuro: Cranial nerves II through XII are intact, neurovascularly intact in all extremities with 2+ DTRs and 2+ pulses. Lymph: No lymphadenopathy of posterior or anterior cervical chain or axillae bilaterally.  Gait normal with good balance and coordination. Does have scoliosis. MSK: Non tender with full range of motion and good stability and symmetric strength and tone of  shoulders, elbows, wrist, hip and ankles bilaterally.  Knee: Left Normal to inspection with no erythema or effusion or obvious bony abnormalities. Palpation normal with no warmth, joint line tenderness, patellar tenderness, or condyle tenderness. ROM full in flexion and extension and lower leg rotation. Ligaments with solid consistent endpoints including ACL, PCL, LCL, MCL. Negative Mcmurray's, Apley's, and Thessalonian tests. Non painful patellar compression. Patellar glide without crepitus. Patellar and quadriceps tendons unremarkable. Hamstring and quadriceps strength 5 out of 5 Contralateral side unremarkable   Impression and Recommendations:     This case required medical decision making of moderate complexity.

## 2013-04-16 ENCOUNTER — Encounter: Payer: Medicare Other | Admitting: Internal Medicine

## 2013-04-23 ENCOUNTER — Encounter: Payer: Self-pay | Admitting: Family Medicine

## 2013-04-23 ENCOUNTER — Ambulatory Visit (INDEPENDENT_AMBULATORY_CARE_PROVIDER_SITE_OTHER): Payer: Medicare Other | Admitting: Family Medicine

## 2013-04-23 VITALS — BP 132/74 | HR 112

## 2013-04-23 DIAGNOSIS — S76312D Strain of muscle, fascia and tendon of the posterior muscle group at thigh level, left thigh, subsequent encounter: Secondary | ICD-10-CM

## 2013-04-23 DIAGNOSIS — Z5189 Encounter for other specified aftercare: Secondary | ICD-10-CM

## 2013-04-23 NOTE — Patient Instructions (Signed)
Great to see you Do anything you want Exercises 2-3 times a week for another 6 weeks Compression only if needed or 30 minutes after exercises Happy holidays!

## 2013-04-23 NOTE — Assessment & Plan Note (Addendum)
Patient continues to do well with compression, HEP and NDSAIDs PRN.  At this point patient is full to do any type of activity she would like. Patient will followup on an as-needed basis. Please see patient instructions for further details

## 2013-04-23 NOTE — Progress Notes (Signed)
  CC: Knee pain, followup  HPI: Patient is a very pleasant 67 year old female following up with left knee pain. 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 Was doing 70% better at last visit. We decreased the frequency of the exercises and started to increase her regular activity. Patient states  Past medical, surgical, family and social history reviewed. Medications reviewed all in the electronic medical record.   Review of Systems: No headache, visual changes, nausea, vomiting, diarrhea, constipation, dizziness, abdominal pain, skin rash, fevers, chills, night sweats, weight loss, swollen lymph nodes, body aches, joint swelling, muscle aches, chest pain, shortness of breath, mood changes.   Objective:    Blood pressure 132/74, pulse 112, SpO2 98.00%.   General: No apparent distress alert and oriented x3 mood and affect normal, dressed appropriately.  HEENT: Pupils equal, extraocular movements intact Respiratory: Patient's speak in full sentences and does not appear short of breath Cardiovascular: No lower extremity edema, non tender, no erythema Skin: Warm dry intact with no signs of infection or rash on extremities or on axial skeleton. Abdomen: Soft nontender Neuro: Cranial nerves II through XII are intact, neurovascularly intact in all extremities with 2+ DTRs and 2+ pulses. Lymph: No lymphadenopathy of posterior or anterior cervical chain or axillae bilaterally.  Gait normal with good balance and coordination. Does have scoliosis. MSK: Non tender with full range of motion and good stability and symmetric strength and tone of shoulders, elbows, wrist, hip and ankles bilaterally.  Knee: Left Normal to inspection with no erythema or effusion or obvious bony abnormalities. Palpation normal with no warmth, joint line tenderness, patellar tenderness, or condyle tenderness. ROM full in flexion and extension and lower leg rotation. Ligaments with solid consistent endpoints  including ACL, PCL, LCL, MCL. Negative Mcmurray's, Apley's, and Thessalonian tests. Non painful patellar compression. Patellar glide without crepitus. Patellar and quadriceps tendons unremarkable. Hamstring and quadriceps strength 5 out of 5 Contralateral side unremarkable   Impression and Recommendations:     This case required medical decision making of moderate complexity.

## 2013-04-23 NOTE — Progress Notes (Signed)
Pre-visit discussion using our clinic review tool. No additional management support is needed unless otherwise documented below in the visit note.  

## 2013-05-01 ENCOUNTER — Other Ambulatory Visit: Payer: Self-pay | Admitting: Internal Medicine

## 2013-05-02 NOTE — Telephone Encounter (Signed)
Amlodipine refilled per protocol. JG//CMA 

## 2013-07-30 ENCOUNTER — Other Ambulatory Visit (HOSPITAL_BASED_OUTPATIENT_CLINIC_OR_DEPARTMENT_OTHER): Payer: Medicare Other

## 2013-07-30 ENCOUNTER — Encounter: Payer: Self-pay | Admitting: Oncology

## 2013-07-30 ENCOUNTER — Ambulatory Visit (HOSPITAL_BASED_OUTPATIENT_CLINIC_OR_DEPARTMENT_OTHER): Payer: Medicare Other | Admitting: Oncology

## 2013-07-30 VITALS — BP 129/83 | HR 62 | Temp 98.1°F | Resp 18 | Ht <= 58 in | Wt 116.7 lb

## 2013-07-30 DIAGNOSIS — Z853 Personal history of malignant neoplasm of breast: Secondary | ICD-10-CM

## 2013-07-30 DIAGNOSIS — M949 Disorder of cartilage, unspecified: Secondary | ICD-10-CM

## 2013-07-30 DIAGNOSIS — E559 Vitamin D deficiency, unspecified: Secondary | ICD-10-CM

## 2013-07-30 DIAGNOSIS — M899 Disorder of bone, unspecified: Secondary | ICD-10-CM

## 2013-07-30 DIAGNOSIS — M8580 Other specified disorders of bone density and structure, unspecified site: Secondary | ICD-10-CM

## 2013-07-30 LAB — CBC WITH DIFFERENTIAL/PLATELET
BASO%: 1.5 % (ref 0.0–2.0)
Basophils Absolute: 0.1 10*3/uL (ref 0.0–0.1)
EOS ABS: 0.1 10*3/uL (ref 0.0–0.5)
EOS%: 2.2 % (ref 0.0–7.0)
HCT: 41.1 % (ref 34.8–46.6)
HGB: 13.3 g/dL (ref 11.6–15.9)
LYMPH%: 33.2 % (ref 14.0–49.7)
MCH: 29.9 pg (ref 25.1–34.0)
MCHC: 32.3 g/dL (ref 31.5–36.0)
MCV: 92.5 fL (ref 79.5–101.0)
MONO#: 0.4 10*3/uL (ref 0.1–0.9)
MONO%: 8.3 % (ref 0.0–14.0)
NEUT%: 54.8 % (ref 38.4–76.8)
NEUTROS ABS: 2.4 10*3/uL (ref 1.5–6.5)
Platelets: 216 10*3/uL (ref 145–400)
RBC: 4.45 10*6/uL (ref 3.70–5.45)
RDW: 13.3 % (ref 11.2–14.5)
WBC: 4.5 10*3/uL (ref 3.9–10.3)
lymph#: 1.5 10*3/uL (ref 0.9–3.3)

## 2013-07-30 LAB — COMPREHENSIVE METABOLIC PANEL (CC13)
ALK PHOS: 58 U/L (ref 40–150)
ALT: 43 U/L (ref 0–55)
ANION GAP: 12 meq/L — AB (ref 3–11)
AST: 27 U/L (ref 5–34)
Albumin: 4.3 g/dL (ref 3.5–5.0)
BILIRUBIN TOTAL: 0.94 mg/dL (ref 0.20–1.20)
BUN: 13.8 mg/dL (ref 7.0–26.0)
CO2: 25 meq/L (ref 22–29)
Calcium: 9.7 mg/dL (ref 8.4–10.4)
Chloride: 105 mEq/L (ref 98–109)
Creatinine: 0.7 mg/dL (ref 0.6–1.1)
Glucose: 105 mg/dl (ref 70–140)
Potassium: 3.8 mEq/L (ref 3.5–5.1)
SODIUM: 142 meq/L (ref 136–145)
TOTAL PROTEIN: 7.5 g/dL (ref 6.4–8.3)

## 2013-07-30 MED ORDER — LETROZOLE 2.5 MG PO TABS: 2.5000 mg | ORAL_TABLET | Freq: Every day | ORAL | Status: DC

## 2013-07-30 NOTE — Progress Notes (Signed)
[name] OFFICE PROGRESS NOTE  Patient Care Team: Hendricks Limes, MD as PCP - General Dr. Fanny Skates  Dr. Arloa Koh  Dr. Kristie Cowman  DIAGNOSIS: 68 year old female with invasive ductal carcinoma with DCIS of the left breast. She also was found to have right breast cancer   SUMMARY OF ONCOLOGIC HISTORY: 1 status post bilateral mastectomies on 01/13/2011. Patient had a left simple mastectomy that revealed 2 foci of invasive ductal carcinoma one measuring 1.2 cm and a second focus measuring 0.5 cm grade 2 with associated DCIS and LV I. 8 sentinel nodes were negative for metastatic disease. Patient had a right simple mastectomy that showed a 1.3 in centimeter invasive ductal carcinoma grade 2 with associated DCIS no LV I. One sentinel node was negative for metastatic disease. Tumor #1 was estrogen receptor +100% progesterone receptor 100% proliferation marker 14% HER-2/neu negative. Tumor #2 was ER +100% PR +98% proliferation marker 13% and HER-2/neu negative.  #2 patient Had Oncotype DX performed on the largest tumor at the score was 10 giving her a 7% risk of recurrence at 5 years with antiestrogen therapy only.  #3 patient has had bilateral reconstruction performed.She is to have nipple reconstructed in a few months.  #4 patient was started on Letrozole mg daily in October 2012. A total of 5 years as planned.   CURRENT THERAPY: Letrozole 2.5 mg daily   INTERVAL HISTORY: Courtney [name] 68 y.o. female returns for followup visit today. Overall she's doing well. She has been on letrozole tolerating it very nicely. She has no fevers chills night sweats headaches shortness of breath chest pains palpitations. Bilaterally reconstructive breasts are well-healed no evidence of local current  I have reviewed the past medical history, past surgical history, social history and family history with the patient and they are unchanged from previous note.  ALLERGIES:  has No Known  Allergies.  MEDICATIONS:  Current Outpatient Prescriptions  Medication Sig Dispense Refill  . amLODipine (NORVASC) 5 MG tablet Take 1 tablet by mouth once daily  30 tablet  5  . aspirin 81 MG tablet Take 81 mg by mouth daily.        . Azelaic Acid (FINACEA EX) Apply topically as directed.      . Calcium Carb-Cholecalciferol (CALCIUM 1000 + D PO) Take 1 tablet by mouth daily.      . Cholecalciferol (VITAMIN D3) 1000 UNITS CAPS Take by mouth daily.        . clobetasol (TEMOVATE) 0.05 % external solution Apply 1 application topically daily.      Marland Kitchen co-enzyme Q-10 30 MG capsule Take 50 mg by mouth daily.       . fluticasone (FLONASE) 50 MCG/ACT nasal spray Place 1 spray into the nose 2 (two) times daily as needed for rhinitis.  16 g  11  . Ginseng (ELEUTHERO PO) Take by mouth as directed.      Marland Kitchen HORSE CHESTNUT PO Take 250 mg by mouth daily.        Marland Kitchen letrozole (FEMARA) 2.5 MG tablet Take 2.5 mg by mouth daily.       Marland Kitchen losartan-hydrochlorothiazide (HYZAAR) 100-12.5 MG per tablet Take 1 tablet by mouth daily  30 tablet  5  . montelukast (SINGULAIR) 10 MG tablet TAKE 1 TABLET BY MOUTH EVERY NIGHT AT BEDTIME  30 tablet  5  . Multiple Vitamins-Minerals (MULTIVITAMIN WITH MINERALS) tablet Take 2 tablets by mouth daily.        . OMEGA 3  1000 MG CAPS Take 1,000 mg by mouth daily.        Marland Kitchen omeprazole (PRILOSEC) 20 MG capsule TAKE ONE CAPSULE BY MOUTH TWICE DAILY  60 capsule  5  . pyridOXINE (VITAMIN B-6) 50 MG tablet Take 50 mg by mouth daily.      . SYMBICORT 160-4.5 MCG/ACT inhaler INHALE ONE OR TWO PUFFS BY MOUTH EVERY TWELVE HOURS. GARGLE AND SPIT AFTER EACH USE.  10.2 g  1  . tretinoin (RETIN-A) 0.025 % cream Apply topically at bedtime.      . cefUROXime (CEFTIN) 500 MG tablet Take 1 tablet (500 mg total) by mouth 2 (two) times daily.  14 tablet  0  . meloxicam (MOBIC) 15 MG tablet Take 1 tablet (15 mg total) by mouth daily.  30 tablet  0   No current facility-administered medications for this  visit.    REVIEW OF SYSTEMS:   Constitutional: Denies fevers, chills or abnormal weight loss Eyes: Denies blurriness of vision Ears, nose, mouth, throat, and face: Denies mucositis or sore throat Respiratory: Denies cough, dyspnea or wheezes Cardiovascular: Denies palpitation, chest discomfort or lower extremity swelling Gastrointestinal:  Denies nausea, heartburn or change in bowel habits Skin: Denies abnormal skin rashes Lymphatics: Denies new lymphadenopathy or easy bruising Neurological:Denies numbness, tingling or new weaknesses Behavioral/Psych: Mood is stable, no new changes  All other systems were reviewed with the patient and are negative.  PHYSICAL EXAMINATION: ECOG PERFORMANCE STATUS: 0 - Asymptomatic  Filed Vitals:   07/30/13 1142  BP: 129/83  Pulse: 62  Temp: 98.1 F (36.7 C)  Resp: 18   Filed Weights   07/30/13 1142  Weight: 116 lb 11.2 oz (52.935 kg)    GENERAL:alert, no distress and comfortable SKIN: skin color, texture, turgor are normal, no rashes or significant lesions EYES: normal, Conjunctiva are pink and non-injected, sclera clear OROPHARYNX:no exudate, no erythema and lips, buccal mucosa, and tongue normal  NECK: supple, thyroid normal size, non-tender, without nodularity LYMPH:  no palpable lymphadenopathy in the cervical, axillary or inguinal LUNGS: clear to auscultation and percussion with normal breathing effort HEART: regular rate & rhythm and no murmurs and no lower extremity edema ABDOMEN:abdomen soft, non-tender and normal bowel sounds Musculoskeletal:no cyanosis of digits and no clubbing  NEURO: alert & oriented x 3 with fluent speech, no focal motor/sensory deficits  LABORATORY DATA:  I have reviewed the data as listed    Component Value Date/Time   NA 142 07/30/2013 1126   NA 142 12/23/2011 1055   K 3.8 07/30/2013 1126   K 4.4 12/23/2011 1055   CL 104 06/08/2012 1202   CL 106 12/23/2011 1055   CO2 25 07/30/2013 1126   CO2 29 12/23/2011  1055   GLUCOSE 105 07/30/2013 1126   GLUCOSE 105* 06/08/2012 1202   GLUCOSE 105* 12/23/2011 1055   BUN 13.8 07/30/2013 1126   BUN 19 12/23/2011 1055   CREATININE 0.7 07/30/2013 1126   CREATININE 0.66 12/23/2011 1055   CALCIUM 9.7 07/30/2013 1126   CALCIUM 9.4 12/23/2011 1055   PROT 7.5 07/30/2013 1126   PROT 6.8 12/23/2011 1055   ALBUMIN 4.3 07/30/2013 1126   ALBUMIN 4.2 12/23/2011 1055   AST 27 07/30/2013 1126   AST 22 12/23/2011 1055   ALT 43 07/30/2013 1126   ALT 27 12/23/2011 1055   ALKPHOS 58 07/30/2013 1126   ALKPHOS 42 12/23/2011 1055   BILITOT 0.94 07/30/2013 1126   BILITOT 0.6 12/23/2011 1055   GFRNONAA >60 01/11/2011 1407  GFRAA >60 01/11/2011 1407    No results found for this basename: SPEP, UPEP,  kappa and lambda light chains    Lab Results  Component Value Date   WBC 4.5 07/30/2013   NEUTROABS 2.4 07/30/2013   HGB 13.3 07/30/2013   HCT 41.1 07/30/2013   MCV 92.5 07/30/2013   PLT 216 07/30/2013      Chemistry      Component Value Date/Time   NA 142 07/30/2013 1126   NA 142 12/23/2011 1055   K 3.8 07/30/2013 1126   K 4.4 12/23/2011 1055   CL 104 06/08/2012 1202   CL 106 12/23/2011 1055   CO2 25 07/30/2013 1126   CO2 29 12/23/2011 1055   BUN 13.8 07/30/2013 1126   BUN 19 12/23/2011 1055   CREATININE 0.7 07/30/2013 1126   CREATININE 0.66 12/23/2011 1055      Component Value Date/Time   CALCIUM 9.7 07/30/2013 1126   CALCIUM 9.4 12/23/2011 1055   ALKPHOS 58 07/30/2013 1126   ALKPHOS 42 12/23/2011 1055   AST 27 07/30/2013 1126   AST 22 12/23/2011 1055   ALT 43 07/30/2013 1126   ALT 27 12/23/2011 1055   BILITOT 0.94 07/30/2013 1126   BILITOT 0.6 12/23/2011 1055       RADIOGRAPHIC STUDIES: I have personally reviewed the radiological images as listed and agreed with the findings in the report. No results found.    ASSESSMENT & PLAN:  68 year old female with  #1 patient with bilateral breast cancers. On the left check 2 foci of invasive ductal carcinoma largest one measuring 1.2 cm (T1  N0) invasive ductal carcinoma with a second focus measures 0.5 cm. Patient had a mastectomy with sentinel lymph node biopsy that was negative. On the right patient underwent simple mastectomy that showed 1.3 cm invasive ductal carcinoma with DCIS one sentinel node negative for metastatic disease. Both tumors were ER positive PR positive HER-2/neu negative.  #2 Oncotype DX testing revealed recurrent score of 10 with giving her a 7% risk of recurrence at 5 years with antiestrogen therapy. We therefore started her on letrozole 2.5 mg daily starting in October 2012. She is to have total of 5 years of therapy.  #3 patient is status post bilateral immediate reconstruction. Overall she's doing well. She has no evidence of recurrence.   #4 bone density: Last bone density scan was in October 2013. She is due for another one I have gone ahead and ordered this. Her prior bone density patient had a low bone mass. She was recommended taking calcium and vitamin D.  #5 followup: Patient will be seen back in 6 months time.  No orders of the defined types were placed in this encounter.   All questions were answered. The patient knows to call the clinic with any problems, questions or concerns. No barriers to learning was detected. I spent 20 minutes counseling the patient face to face. The total time spent in the appointment was 30 minutes and more than 50% was on counseling and review of test results     Marcy Panning, MD 07/30/2013 12:38 PM

## 2013-07-30 NOTE — Patient Instructions (Signed)
Continue letrozole 2.5 mg daily  Bone density in October 2015.  We will see you back in October after the bone density  Breast Cancer Survivor Follow-Up Breast cancer begins when cells in the breast divide too rapidly. The extra cells form a lump (tumor). When the cancer is treated, the goal is to get rid of all cancer cells. However, sometimes a few cells survive. These cancer cells can then grow. They become recurrent cancer. This means the cancer comes back after treatment.  Most cases of recurrent breast cancer develop 3 to 5 years after treatment. However, sometimes it comes back just a few months after treatment. Other times, it does not come back until years later. If the cancer comes back in the same area as the first breast cancer, it is called a local recurrence. If the cancer comes back somewhere else in the body, it is called regional recurrence if the site is fairly near the breast or distant recurrence if it is far from the breast. Your caregiver may also use the term metastasize to indicate a cancer that has gone to another part of your body. Treatment is still possible after either kind of recurrence. The cancer can still be controlled.  CAUSES OF RECURRENT CANCER No one knows exactly why breast cancer starts in the first place. Why the cancer comes back after treatment is also not clear. It is known that certain conditions, called risk factors, can make this more likely. They include:  Developing breast cancer for the first time before age 15.  Having breast cancer that involves the lymph nodes. These are small, round pieces of tissue found all over the body. Their job is to help fight infections.  Having a large tumor. Cancer is more apt to come back if the first tumor was bigger than 2 inches (5 cm).  Having certain types of breast cancer, such as:  Inflammatory breast cancer. This rare type grows rapidly and causes the breast to become red and swollen.  A high-grade tumor.  The grade of a tumor indicates how fast it will grow and spread. High-grade tumors grow more quickly than other types.  HER2 cancer. This refers to the tumor's genetic makeup. Tumors that have this type of gene are more likely to come back after treatment.  Having close tumor margins. This refers to the space between the tumor and normal, noncancerous cells. If the space is small, the tumor has a greater chance of coming back.  Having treatment involving a surgery to remove the tumor but not the entire breast (lumpectomy) and no radiation therapy. CARE AFTER BREAST CANCER Home Monitoring Women who have had breast cancer should continue to examine their breasts every month. The goal is to catch the cancer quickly if it comes back. Many women find it helpful to do so on the same day each month and to mark the calendar as a reminder. Let your caregiver know immediately if you have any signs of recurrent breast cancer. Symptoms will vary, depending on where the cancer recurs. The original type of treatment can also make a difference. Symptoms of local recurrence after a lumpectomy or a recurrence in the opposite breast may include:  A new lump or thickening in the breast.  A change in the way the skin looks on the breast (such as a rash, dimpling, or wrinkling).  Redness or swelling of the breast.  Changes in the nipple (such as being red, puckered, swollen, or leaking fluid). Symptoms of a recurrence  after a breast removal surgery (mastectomy) may include:  A lump or thickening under the skin.  A thickening around the mastectomy scar. Symptoms of regional recurrence in the lymph nodes near the breast may include:  A lump under the arm or above the collarbone.  Swelling of the arm.  Pain in the arm, shoulder, or chest.  Numbness in the hand or arm. Symptoms of distant recurrence may include:  A cough that does not go away.  Trouble breathing or shortness of breath.  Pain in the  bones or the chest. This is pain that lasts or does not respond to rest and medicine.  Headaches.  Sudden vision problems.  Dizziness.  Nausea or vomiting.  Losing weight without trying to.  Persistent abdominal pain.  Changes in bowel movements or blood in the stool.  Yellowing of the skin or eyes (jaundice).  Blood in the urine or bloody vaginal discharge. Clinical Monitoring  It is helpful to keep a schedule of appointments for needed tests and exams. This includes physical exams, breast exams, exams of the lymph nodes, and general exams.  For the first 3 years after being treated for breast cancer, see your caregiver every 3 to 6 months.  For years 4 and 5 after breast cancer, see your caregiver every 6 to 12 months.  After 5 years, see your caregiver at least once a year.  Regular breast X-rays (mammograms) should continue even if you had a mastectomy.  A mammogram should be done 1 year after the mammogram that first detected breast cancer.  A mammogram should be done every 6 to 12 months after that. Follow your caregiver's advice.  A pelvic exam done by your caregiver checks whether female organs are the normal size and shape. The exam is usually done every year. Ask your caregiver if that schedule is right for you.  Women taking tamoxifen should report any vaginal bleeding immediately to their caregiver. Tamoxifen is often given to women with a certain type of breast cancer. It has been shown to help prevent recurrence.  You will need to decide who your primary caregiver will be.  Most people continue to see their cancer specialist (oncologist) every 3 to 6 months for the first year after cancer treatment.  At some point, you may want to go back to seeing your family caregiver. You would no longer see your oncologist for regular checkups. Many women do this about 1 year after their first diagnosis of breast cancer.  You will still need to be seen every so often by  your oncologist. Ask how often that should be. Coordinate this with your family or primary caregiver.  Think about having genetic counseling. This would provide information on traits that can be passed or inherited from one generation to the next. In some cases, breast cancer runs in families. Tell your caregiver if you:  Are of Ashkenazi Jewish heritage.  Have any family member who has had ovarian cancer.  Have a mother, sister, or daughter who had breast cancer before age 53.  Have 2 or more close relatives who have had breast cancer. This means a mother, sister, daughter, aunt, or grandmother.  Had breast cancer in both breasts.  Have a female relative who has had breast cancer.  Some tests are not recommended for routine screening. Someone recovering from breast cancer does not need to have these tests if there are no problems. The tests have risks, such as radiation exposure, and can be costly. The risks  of these tests are thought to be greater than the benefits:  Blood tests.  Chest X-rays.  Bone scans.  Liver ultrasound.  Computed tomography (CT scan).  Positron emission tomography (PET scan).  Magnetic resonance imaging (MRI scan). DIAGNOSIS OF RECURRENT CANCER Recurrent breast cancer may be suspected for various reasons. A mammogram may not look normal. You might feel a lump or have other symptoms. Your caregiver may find something unusual during an exam. To be sure, your caregiver will probably order some tests. The tests are needed because there are symptoms or hints of a problem. They could include:  Blood tests, including a test to check how well the liver is working. The liver is a common site for a distant cancer recurrence.  Imaging tests that create pictures of the inside of the body. These tests include:  Chest X-rays to show if the cancer has come back in the lungs.  CT scans to create detailed pictures of various areas of the body and help find a distant  recurrence.  MRI scans to find anything unusual in the breast, chest, or lymph nodes.  Breast ultrasound tests to examine the breasts.  Bone scans to create a picture of your whole skeleton and find cancer in bony areas.  PET scans to create an image of the whole body. PET scans can be used together with CT scans to show more detail.  Biopsy. A small sample of tissue is taken and checked under a microscope. If cancer cells are found, they may be tested to see if they contain the HER2 gene or the hormones estrogen and progesterone. This will help your caregiver decide how to treat the recurrent cancer. TREATMENT  How recurrent breast cancer is treated depends on where the new cancer is found. The type of treatment that was used for the first breast cancer makes a difference, too. A combination of treatments may be used. Options include:  Surgery.  If the cancer comes back in the breast that was not treated before, you may need a lumpectomy or mastectomy.  If the cancer comes back in the breast that was treated before, you may need a mastectomy.  The lymph nodes under the arm may need to be removed.  Radiation therapy.  For a local recurrence, radiation may be used if it was not used during the first treatment.  For a distance recurrence, radiation is sometimes used.  Chemotherapy.  This may be used before surgery to treat recurrent breast cancer.  This may be used to treat recurrent cancer that cannot be treated with surgery.  This may be used to treat a distant recurrence.  Hormone therapy.  Women with the HER2 gene may be given hormone therapy to attack this gene. Document Released: 12/16/2010 Document Revised: 07/12/2011 Document Reviewed: 12/16/2010 [facility] Patient Information 2014 [place], [place].

## 2013-07-31 ENCOUNTER — Other Ambulatory Visit: Payer: Self-pay | Admitting: Internal Medicine

## 2013-07-31 ENCOUNTER — Encounter: Payer: Self-pay | Admitting: Oncology

## 2013-07-31 LAB — VITAMIN D 25 HYDROXY (VIT D DEFICIENCY, FRACTURES): VIT D 25 HYDROXY: 68 ng/mL (ref 30–89)

## 2013-08-02 ENCOUNTER — Telehealth: Payer: Self-pay

## 2013-08-02 NOTE — Telephone Encounter (Signed)
Per KK note below - let pt know her Vit D level is good - no changes to meds.  Pt voiced understanding.

## 2013-08-02 NOTE — Telephone Encounter (Signed)
Message copied by Prentiss Bells on Thu Aug 02, 2013 11:13 AM ------      Message from: Deatra Robinson      Created: Wed Aug 01, 2013  4:51 PM       Vitamin D level looks good, no changes in meds ------

## 2013-08-15 ENCOUNTER — Other Ambulatory Visit: Payer: Self-pay | Admitting: Dermatology

## 2013-08-18 ENCOUNTER — Encounter: Payer: Self-pay | Admitting: Internal Medicine

## 2013-08-18 DIAGNOSIS — L570 Actinic keratosis: Secondary | ICD-10-CM | POA: Insufficient documentation

## 2013-10-12 ENCOUNTER — Telehealth: Payer: Self-pay | Admitting: Emergency Medicine

## 2013-10-12 NOTE — Telephone Encounter (Signed)
Patient called office and left a message stating she would like for her care to be assigned to Dr Jana Hakim. Left message for patient on home voicemail and cell phone voice mail. Patient was seen in the office on 07/30/13 and is due for a follow up in 6 months time from that date. Instructed that this office is working to reassign patients and someone will be in contact with her in the near future. Instructed patient to call this office for any further questions or concerns.

## 2013-10-14 ENCOUNTER — Other Ambulatory Visit: Payer: Self-pay | Admitting: Oncology

## 2013-10-14 DIAGNOSIS — C50919 Malignant neoplasm of unspecified site of unspecified female breast: Secondary | ICD-10-CM

## 2013-10-16 ENCOUNTER — Telehealth: Payer: Self-pay | Admitting: Oncology

## 2013-10-16 NOTE — Telephone Encounter (Signed)
pt cld to sch appt w/GM-pre Kk pt and has talkwed w/ Val and per GM ok to sch appt. Pt sch BD herself @ [facility]-adv pt of appt time & date-pt understood

## 2013-10-16 NOTE — Telephone Encounter (Signed)
per pof to sch appt-cld & spoke w/pt and adv GM wanted the appt movede to August-gave pt time & date-pt understood

## 2013-11-05 ENCOUNTER — Other Ambulatory Visit: Payer: Self-pay | Admitting: Internal Medicine

## 2013-11-10 ENCOUNTER — Encounter: Payer: Self-pay | Admitting: Internal Medicine

## 2013-12-18 ENCOUNTER — Other Ambulatory Visit (HOSPITAL_BASED_OUTPATIENT_CLINIC_OR_DEPARTMENT_OTHER): Payer: Medicare Other

## 2013-12-18 DIAGNOSIS — C50919 Malignant neoplasm of unspecified site of unspecified female breast: Secondary | ICD-10-CM

## 2013-12-18 DIAGNOSIS — C50419 Malignant neoplasm of upper-outer quadrant of unspecified female breast: Secondary | ICD-10-CM

## 2013-12-18 LAB — COMPREHENSIVE METABOLIC PANEL (CC13)
ALT: 36 U/L (ref 0–55)
AST: 26 U/L (ref 5–34)
Albumin: 3.8 g/dL (ref 3.5–5.0)
Alkaline Phosphatase: 53 U/L (ref 40–150)
Anion Gap: 9 mEq/L (ref 3–11)
BILIRUBIN TOTAL: 0.46 mg/dL (ref 0.20–1.20)
BUN: 18.3 mg/dL (ref 7.0–26.0)
CALCIUM: 9.5 mg/dL (ref 8.4–10.4)
CHLORIDE: 108 meq/L (ref 98–109)
CO2: 25 mEq/L (ref 22–29)
CREATININE: 0.8 mg/dL (ref 0.6–1.1)
Glucose: 120 mg/dl (ref 70–140)
Potassium: 3.8 mEq/L (ref 3.5–5.1)
Sodium: 143 mEq/L (ref 136–145)
Total Protein: 6.7 g/dL (ref 6.4–8.3)

## 2013-12-18 LAB — CBC WITH DIFFERENTIAL/PLATELET
BASO%: 1.4 % (ref 0.0–2.0)
BASOS ABS: 0.1 10*3/uL (ref 0.0–0.1)
EOS%: 2.5 % (ref 0.0–7.0)
Eosinophils Absolute: 0.1 10*3/uL (ref 0.0–0.5)
HEMATOCRIT: 37.3 % (ref 34.8–46.6)
HEMOGLOBIN: 12.1 g/dL (ref 11.6–15.9)
LYMPH#: 1.2 10*3/uL (ref 0.9–3.3)
LYMPH%: 23.5 % (ref 14.0–49.7)
MCH: 30 pg (ref 25.1–34.0)
MCHC: 32.3 g/dL (ref 31.5–36.0)
MCV: 92.8 fL (ref 79.5–101.0)
MONO#: 0.4 10*3/uL (ref 0.1–0.9)
MONO%: 8.5 % (ref 0.0–14.0)
NEUT#: 3.2 10*3/uL (ref 1.5–6.5)
NEUT%: 64.1 % (ref 38.4–76.8)
PLATELETS: 206 10*3/uL (ref 145–400)
RBC: 4.02 10*6/uL (ref 3.70–5.45)
RDW: 13.6 % (ref 11.2–14.5)
WBC: 5 10*3/uL (ref 3.9–10.3)

## 2013-12-25 ENCOUNTER — Other Ambulatory Visit: Payer: Medicare Other

## 2013-12-25 ENCOUNTER — Ambulatory Visit (HOSPITAL_BASED_OUTPATIENT_CLINIC_OR_DEPARTMENT_OTHER): Payer: Medicare Other | Admitting: Oncology

## 2013-12-25 VITALS — BP 138/63 | HR 66 | Temp 98.5°F | Resp 18 | Ht <= 58 in | Wt 115.5 lb

## 2013-12-25 DIAGNOSIS — M899 Disorder of bone, unspecified: Secondary | ICD-10-CM

## 2013-12-25 DIAGNOSIS — C50911 Malignant neoplasm of unspecified site of right female breast: Secondary | ICD-10-CM

## 2013-12-25 DIAGNOSIS — Z170 Estrogen receptor positive status [ER+]: Secondary | ICD-10-CM

## 2013-12-25 DIAGNOSIS — M949 Disorder of cartilage, unspecified: Secondary | ICD-10-CM

## 2013-12-25 DIAGNOSIS — C50919 Malignant neoplasm of unspecified site of unspecified female breast: Secondary | ICD-10-CM

## 2013-12-25 DIAGNOSIS — C50912 Malignant neoplasm of unspecified site of left female breast: Secondary | ICD-10-CM

## 2013-12-25 DIAGNOSIS — M8580 Other specified disorders of bone density and structure, unspecified site: Secondary | ICD-10-CM

## 2013-12-25 MED ORDER — GABAPENTIN 300 MG PO CAPS: 300.0000 mg | ORAL_CAPSULE | Freq: Every day | ORAL | Status: DC

## 2013-12-25 MED ORDER — LETROZOLE 2.5 MG PO TABS: 2.5000 mg | ORAL_TABLET | Freq: Every day | ORAL | Status: DC

## 2013-12-25 NOTE — Progress Notes (Signed)
[place]  [phone] [phone]     ID: Courtney [name] DOB: 1945/11/03  MR#: [id number]  HQI#:[id number]  Patient Care Team: Hendricks Limes, MD as PCP - General Chauncey Cruel, MD as Consulting Physician (Oncology)   CHIEF COMPLAINT: bilateral breast cancer  CURRENT TREATMENT: letrozole   BREAST CANCER HISTORY: From doctor Dana Allan 06/14/2011 summary:  "#1 status post bilateral mastectomies on 01/13/2011. Patient had a left simple mastectomy that revealed 2 foci of invasive ductal carcinoma one measuring 1.2 cm and a second focus measuring 0.5 cm grade 2 with associated DCIS and LV I. 8 sentinel nodes were negative for metastatic disease. Patient had a right simple mastectomy that showed a 1.3 in centimeter invasive ductal carcinoma grade 2 with associated DCIS no LV I. One sentinel node was negative for metastatic disease. Tumor #1 was estrogen receptor +100% progesterone receptor 100% proliferation marker 14% HER-2/neu negative. Tumor #2 was ER +100% PR +98% proliferation marker 13% and HER-2/neu negative.  #2 patient Had Oncotype DX performed on the largest tumor at the score was 10 giving her a 7% risk of recurrence at 5 years with antiestrogen therapy only.  #3 patient has had bilateral reconstruction performed. As is being done by Dr. Crissie Reese.  #4 patient was started on Letrozole mg daily in October 2012. A total of 5 years as planned."  The patient's subsequent history is as detailed below  INTERVAL HISTORY: Courtney [name] returns for followup of her bilateral estrogen receptor positive breast cancers. She is establishing herself in my service today. She continues on letrozole, with good tolerance. She does complain of hot flashes, particularly at night, and some vaginal dryness. Since her last visit here she had a repeat bone density which was showing only mild osteopenia, does show further bone density loss as compared to  prior.  REVIEW OF SYSTEMS: She frequently has a morning cough, which is occasionally slightly productive. She denies shortness of breath, pleurisy, hemoptysis, or fever. She has stress urinary incontinence, which she describes as mild. She exercises a doing water aerobics approximately 5 times a week. A detailed review of systems today was otherwise noncontributory  PAST MEDICAL HISTORY: Past Medical History  Diagnosis Date  . GERD (gastroesophageal reflux disease)   . Reactive airway disease   . Night sweats   . Family history of breast cancer   . Hypertension   . Breast cancer July 2012    double mastectomy; Dr Humphrey Rolls  . Diverticulosis     PAST SURGICAL HISTORY: Past Surgical History  Procedure Laterality Date  . Nasal septum surgery    . Thyroglossal duct cyst    . Abdominal hysterectomy    . Breast surgery  01/13/11    bilateral total mastectomy; Dr Dalbert Batman  . Bilateral breast reconstruction  Jan 2013  . Reconstruction / correction of nipple / aerola  June 2013  . Colonoscopy  2014    Dr Olevia Perches; hyperplastic polyp    FAMILY HISTORY Family History  Problem Relation Age of Onset  . Breast cancer Mother   . Colon cancer Mother 35  . Heart attack Father     > 65  . Diabetes Father   . Cancer Sister     appendiceal  . Heart attack Maternal Grandfather     > 55  . Stroke Neg Hx    the patient's father died from a myocardial infarction in his 10s. The patient's mother was diagnosed with breast cancer in her 27s  and with colon cancer in her 6s. She died at age 81. The patient had no brothers. She had 2 sisters neither her would breast or ovarian cancer.  GYNECOLOGIC HISTORY:  No LMP recorded. Patient is postmenopausal. Menarche age 73, first live birth age 45, the patient is [place] P2. She underwent total abdominal hysterectomy and bilateral salpingo-oophorectomy at age 57. She took on one replacement until her breast cancer diagnosis and 2012.  SOCIAL HISTORY:  He worked as  a Water engineer. Her husband of 22 years, home are [place] "[place]" [place], is a Pharmacist, community. Their son Carlis Abbott lives in [place] and works for [employer] and Engineer, mining. Son Aaron Edelman lives in [place] and is a Patent examiner. The patient has one biological grandson and 2 step grandchildren. She attends [facility] [employer]    ADVANCED DIRECTIVES: In place. The patient has a living will and her husband is her healthcare power of attorney   HEALTH MAINTENANCE: History  Substance Use Topics  . Smoking status: Former Smoker    Quit date: 05/03/1965  . Smokeless tobacco: Never Used     Comment: smoked 1962-1967, up to < 1 ppd  . Alcohol Use: 8.4 oz/week    14 Glasses of wine per week     Colonoscopy: 2012/ [facility]  PAP:  Bone density: 11/05/2013 at [facility]; D- 1.4; showed a statistically significant decrease in bone mineral density of the right femur of 4.3% as compared to 2013  Lipid panel:  No Known Allergies  Current Outpatient Prescriptions  Medication Sig Dispense Refill  . amLODipine (NORVASC) 5 MG tablet TAKE 1 TABLET BY MOUTH ONCE DAILY  30 tablet  5  . aspirin 81 MG tablet Take 81 mg by mouth daily.        . Azelaic Acid (FINACEA EX) Apply topically as directed.      . Calcium Carb-Cholecalciferol (CALCIUM 1000 + D PO) Take 1 tablet by mouth daily.      . cefUROXime (CEFTIN) 500 MG tablet Take 1 tablet (500 mg total) by mouth 2 (two) times daily.  14 tablet  0  . Cholecalciferol (VITAMIN D3) 1000 UNITS CAPS Take by mouth daily.        . clobetasol (TEMOVATE) 0.05 % external solution Apply 1 application topically daily.      Marland Kitchen co-enzyme Q-10 30 MG capsule Take 50 mg by mouth daily.       . fluticasone (FLONASE) 50 MCG/ACT nasal spray Place 1 spray into the nose 2 (two) times daily as needed for rhinitis.  16 g  11  . Ginseng (ELEUTHERO PO) Take by mouth as directed.      Marland Kitchen HORSE CHESTNUT PO Take 250 mg by mouth daily.        Marland Kitchen letrozole (FEMARA) 2.5 MG tablet Take 1 tablet (2.5 mg total) by  mouth daily.  90 tablet  10  . losartan-hydrochlorothiazide (HYZAAR) 100-12.5 MG per tablet Take 1 tablet by mouth daily  30 tablet  5  . meloxicam (MOBIC) 15 MG tablet Take 1 tablet (15 mg total) by mouth daily.  30 tablet  0  . montelukast (SINGULAIR) 10 MG tablet TAKE 1 TABLET BY MOUTH EVERY NIGHT AT BEDTIME  30 tablet  5  . Multiple Vitamins-Minerals (MULTIVITAMIN WITH MINERALS) tablet Take 2 tablets by mouth daily.        . OMEGA 3 1000 MG CAPS Take 1,000 mg by mouth daily.        Marland Kitchen omeprazole (PRILOSEC) 20 MG capsule TAKE 1  CAPSULE BY MOUTH TWICE DAILY  60 capsule  5  . pyridOXINE (VITAMIN B-6) 50 MG tablet Take 50 mg by mouth daily.      . SYMBICORT 160-4.5 MCG/ACT inhaler INHALE 1 OR 2 PUFFS EVERY 12 HOURS. GARGLE AND SPIT AFTER EACH USE.  10.2 g  5  . tretinoin (RETIN-A) 0.025 % cream Apply topically at bedtime.       No current facility-administered medications for this visit.    OBJECTIVE: Middle-aged white woman who appears stated age 68 Vitals:   12/25/13 1617  BP: 138/63  Pulse: 66  Temp: 98.5 F (36.9 C)  Resp: 18     Body mass index is 25.91 kg/(m^2).    ECOG FS:1 - Symptomatic but completely ambulatory  Ocular: Sclerae unicteric, pupils equal, round and reactive to light Ear-nose-throat: Oropharynx clear, dentition in good repair Lymphatic: No cervical or supraclavicular adenopathy Lungs no rales or rhonchi, good excursion bilaterally Heart regular rate and rhythm, no murmur appreciated Abd soft, obese, nontender, positive bowel sounds MSK no focal spinal tenderness, no joint edema Neuro: non-focal, well-oriented, appropriate affect Breasts: Status post bilateral mastectomies with bilateral reconstruction. There is no evidence of local recurrence. Both axillae are benign  LAB RESULTS:  CMP     Component Value Date/Time   NA 143 12/18/2013 1542   NA 142 12/23/2011 1055   K 3.8 12/18/2013 1542   K 4.4 12/23/2011 1055   CL 104 06/08/2012 1202   CL 106 12/23/2011  1055   CO2 25 12/18/2013 1542   CO2 29 12/23/2011 1055   GLUCOSE 120 12/18/2013 1542   GLUCOSE 105* 06/08/2012 1202   GLUCOSE 105* 12/23/2011 1055   BUN 18.3 12/18/2013 1542   BUN 19 12/23/2011 1055   CREATININE 0.8 12/18/2013 1542   CREATININE 0.66 12/23/2011 1055   CALCIUM 9.5 12/18/2013 1542   CALCIUM 9.4 12/23/2011 1055   PROT 6.7 12/18/2013 1542   PROT 6.8 12/23/2011 1055   ALBUMIN 3.8 12/18/2013 1542   ALBUMIN 4.2 12/23/2011 1055   AST 26 12/18/2013 1542   AST 22 12/23/2011 1055   ALT 36 12/18/2013 1542   ALT 27 12/23/2011 1055   ALKPHOS 53 12/18/2013 1542   ALKPHOS 42 12/23/2011 1055   BILITOT 0.46 12/18/2013 1542   BILITOT 0.6 12/23/2011 1055   GFRNONAA >60 01/11/2011 1407   GFRAA >60 01/11/2011 1407    I No results found for this basename: SPEP,  UPEP,   kappa and lambda light chains    Lab Results  Component Value Date   WBC 5.0 12/18/2013   NEUTROABS 3.2 12/18/2013   HGB 12.1 12/18/2013   HCT 37.3 12/18/2013   MCV 92.8 12/18/2013   PLT 206 12/18/2013      Chemistry      Component Value Date/Time   NA 143 12/18/2013 1542   NA 142 12/23/2011 1055   K 3.8 12/18/2013 1542   K 4.4 12/23/2011 1055   CL 104 06/08/2012 1202   CL 106 12/23/2011 1055   CO2 25 12/18/2013 1542   CO2 29 12/23/2011 1055   BUN 18.3 12/18/2013 1542   BUN 19 12/23/2011 1055   CREATININE 0.8 12/18/2013 1542   CREATININE 0.66 12/23/2011 1055      Component Value Date/Time   CALCIUM 9.5 12/18/2013 1542   CALCIUM 9.4 12/23/2011 1055   ALKPHOS 53 12/18/2013 1542   ALKPHOS 42 12/23/2011 1055   AST 26 12/18/2013 1542   AST 22 12/23/2011 1055   ALT 36  12/18/2013 1542   ALT 27 12/23/2011 1055   BILITOT 0.46 12/18/2013 1542   BILITOT 0.6 12/23/2011 1055       Lab Results  Component Value Date   LABCA2 19 11/18/2010    No components found with this basename: [id number]    No results found for this basename: INR,  in the last 168 hours  Urinalysis    Component Value Date/Time   COLORURINE YELLOW 01/11/2011 [place] 01/11/2011 1406   LABSPEC 1.026 01/11/2011 1406   PHURINE 5.5 01/11/2011 1406   GLUCOSEU NEGATIVE 01/11/2011 1406   [place] 01/11/2011 1406   [place] 01/11/2011 1406   [place] 01/11/2011 1406   PROTEINUR NEGATIVE 01/11/2011 1406   UROBILINOGEN 0.2 01/11/2011 1406   NITRITE NEGATIVE 01/11/2011 1406   [place] 01/11/2011 1406    STUDIES: No results found.  ASSESSMENT: 68 y.o. [BRCA negative] [place] woman status post bilateral breast biopsies 11/30/2010, both showing invasive ductal carcinoma, both showing strong estrogen and progesterone receptor positivity, low MIB-1-once (13 or less) and no HER-2 amplification.  (1) status post bilateral mastectomies with bilateral sentinel lymph node sampling 01/13/2011, showing  (a) on the left, 2 foci of invasive ductal carcinoma, the larger measuring 1.2 cm, grade 2, both tumors being strongly estrogen and progesterone receptor positive, with MIB-once of 13-14%, and HER-2/neu negative. Her (pT1c pN0 = stage IA)  (b) on the right, apT1c pN0, stage IA invasive ductal carcinoma, grade 2, estrogen receptor 100% positive, progesterone receptor 9% positive, with an MIB-1 of 12% and no HER-2 amplification  (2) Oncotype DX on the largest tumor showed a score of 10, suggesting a risk of recurrence outside of the breast within the next 10 years would be 7% if the patient only systemic therapy was tamoxifen for 5 years. It also predicts no benefit from chemotherapy  (3) letrozole was started October of 2012  (4) Osteopenia, with T score of -1.4 on bone density at [facility] 11/05/2013.   PLAN: We spent approximately one hour today going over [name]'s situation. She understands even though she had 3 separate breast cancers, they were all early stage, low-grade, slow-growing, and estrogen receptor positive. Accordingly her overall prognosis is very good, in fact better than that quoted by the Oncotype, since  letrozole for 5 years is more effective than tamoxifen for 5 years (the difference is approximately 2.5-3% further decrease in overall recurrence rate).  Accordingly the overall plan will be to continue letrozole for a total of 5 years after which she will "graduate" from followup.  We discussed other issues. (a) hot flashes; these occur primarily at night. I think she would benefit from gabapentin at bedtime. She is willing to give it a try; (b) worsening osteopenia; even though her T score is not bad, it is only going to get worse with continuing letrozole, and we discussed bisphosphonates. After much discussion and with a good understanding of the possible toxicities, side effects and complications of zolendronate she is interested in receiving a yearly dose for the next 2 years and we will set that up for 01/02/2014; (c) she gives no symptoms suggestive of early carpal tunnel syndrome. She is going to obtain some wrist splints and see if that improves that problem. Finally (d) she received information on our "pelvic health" program and was encouraged to attend our lecture on sexuality after breast cancer next week  Courtney [name] has a good understanding of the overall plan. She agrees with it. She knows  the goal of treatment in her case is cure. She will call with any problems that may develop before her next visit here.  Chauncey Cruel, MD   12/25/2013 7:29 PM

## 2013-12-26 ENCOUNTER — Telehealth: Payer: Self-pay | Admitting: Oncology

## 2013-12-26 ENCOUNTER — Telehealth: Payer: Self-pay | Admitting: *Deleted

## 2013-12-26 NOTE — Addendum Note (Signed)
Addended by: Renford Dills on: 12/26/2013 09:48 AM   Modules accepted: Orders

## 2013-12-26 NOTE — Telephone Encounter (Signed)
per pof to sch pt appt-sent MW email to sch trmt-will call pt once reply °

## 2013-12-26 NOTE — Telephone Encounter (Signed)
Per staff message and POF I have scheduled appts. Advised scheduler of appts. JMW  

## 2013-12-27 ENCOUNTER — Telehealth: Payer: Self-pay | Admitting: Oncology

## 2013-12-27 NOTE — Telephone Encounter (Signed)
cld & gave pt time for trmt on 9/2-pt understood

## 2014-01-02 ENCOUNTER — Ambulatory Visit (HOSPITAL_BASED_OUTPATIENT_CLINIC_OR_DEPARTMENT_OTHER): Payer: Medicare Other

## 2014-01-02 VITALS — BP 127/64 | HR 66 | Temp 98.7°F | Resp 20

## 2014-01-02 DIAGNOSIS — M949 Disorder of cartilage, unspecified: Secondary | ICD-10-CM

## 2014-01-02 DIAGNOSIS — M8580 Other specified disorders of bone density and structure, unspecified site: Secondary | ICD-10-CM

## 2014-01-02 DIAGNOSIS — M899 Disorder of bone, unspecified: Secondary | ICD-10-CM

## 2014-01-02 MED ORDER — ZOLEDRONIC ACID 4 MG/100ML IV SOLN
4.0000 mg | Freq: Once | INTRAVENOUS | Status: AC
  Administered 2014-01-02: 4 mg via INTRAVENOUS
  Filled 2014-01-02: qty 100

## 2014-01-02 MED ORDER — SODIUM CHLORIDE 0.9 % IV SOLN
INTRAVENOUS | Status: DC
  Administered 2014-01-02: 09:00:00 via INTRAVENOUS

## 2014-01-02 NOTE — Patient Instructions (Signed)

## 2014-01-04 ENCOUNTER — Telehealth: Payer: Self-pay | Admitting: *Deleted

## 2014-01-04 NOTE — Telephone Encounter (Signed)
Message left by pt late pm 9/3 and retrieved this am.  Per message pt states she received zometa on 9/2 " and am having some side effects am wondering if I need to take tylenol or aleve for the discomfort - this sheet they gave me isn't very clear "  This RN returned call this am and obtained identified VM- message left requesting a return call including to request a triage nurse to speak to someone directly.

## 2014-01-08 ENCOUNTER — Encounter: Payer: Self-pay | Admitting: Internal Medicine

## 2014-02-02 ENCOUNTER — Other Ambulatory Visit: Payer: Self-pay | Admitting: Internal Medicine

## 2014-02-04 ENCOUNTER — Ambulatory Visit: Payer: Medicare Other | Admitting: Oncology

## 2014-02-04 ENCOUNTER — Other Ambulatory Visit: Payer: Medicare Other

## 2014-02-14 ENCOUNTER — Other Ambulatory Visit: Payer: Self-pay | Admitting: Dermatology

## 2014-02-15 ENCOUNTER — Other Ambulatory Visit: Payer: Self-pay

## 2014-02-18 ENCOUNTER — Encounter: Payer: Medicare Other | Admitting: Internal Medicine

## 2014-02-19 ENCOUNTER — Ambulatory Visit (INDEPENDENT_AMBULATORY_CARE_PROVIDER_SITE_OTHER): Payer: Medicare Other | Admitting: Internal Medicine

## 2014-02-19 ENCOUNTER — Other Ambulatory Visit (INDEPENDENT_AMBULATORY_CARE_PROVIDER_SITE_OTHER): Payer: Medicare Other

## 2014-02-19 ENCOUNTER — Encounter: Payer: Self-pay | Admitting: Internal Medicine

## 2014-02-19 VITALS — BP 128/78 | HR 70 | Temp 97.9°F | Resp 12 | Ht <= 58 in | Wt 114.4 lb

## 2014-02-19 DIAGNOSIS — R1310 Dysphagia, unspecified: Secondary | ICD-10-CM

## 2014-02-19 DIAGNOSIS — R7301 Impaired fasting glucose: Secondary | ICD-10-CM

## 2014-02-19 DIAGNOSIS — R739 Hyperglycemia, unspecified: Secondary | ICD-10-CM | POA: Insufficient documentation

## 2014-02-19 DIAGNOSIS — E785 Hyperlipidemia, unspecified: Secondary | ICD-10-CM

## 2014-02-19 DIAGNOSIS — Z0000 Encounter for general adult medical examination without abnormal findings: Secondary | ICD-10-CM

## 2014-02-19 DIAGNOSIS — Z23 Encounter for immunization: Secondary | ICD-10-CM

## 2014-02-19 LAB — LIPID PANEL
CHOLESTEROL: 231 mg/dL — AB (ref 0–200)
HDL: 75.2 mg/dL (ref >39.00)
LDL CALC: 144 mg/dL — AB (ref 0–99)
NonHDL: 155.8
Total CHOL/HDL Ratio: 3
Triglycerides: 57 mg/dL (ref 0.0–149.0)
VLDL: 11.4 mg/dL (ref 0.0–40.0)

## 2014-02-19 LAB — TSH: TSH: 1.83 u[IU]/mL (ref 0.35–4.50)

## 2014-02-19 LAB — HEMOGLOBIN A1C: HEMOGLOBIN A1C: 5.8 % (ref 4.6–6.5)

## 2014-02-19 NOTE — Assessment & Plan Note (Signed)
Lipids, LFTs, TSH  

## 2014-02-19 NOTE — Progress Notes (Signed)
Pre visit review using our clinic review tool, if applicable. No additional management support is needed unless otherwise documented below in the visit note. 

## 2014-02-19 NOTE — Progress Notes (Signed)
Subjective:    Patient ID: Courtney [name], female    DOB: Jul 02, 1945, 68 y.o.   MRN: [id number]  HPI UHC/Medicare Wellness Visit: Psychosocial and medical history were reviewed as required by Medicare (history related to abuse, antisocial behavior , firearm risk). Social history: Caffeine:1 coffee plus choc or tea 3 days/ week , Alcohol: 2-3 wine/day , Tobacco use: quit 1967 Exercise:walking 3 X/week ; water aerobics 4X/week;yoga 1X/week up to  8 hours total  w/o symptoms Personal safety/fall risk:no Limitations of activities of daily living:no Seatbelt/ smoke alarm use:yes Healthcare Power of Attorney/Living Will status: UTD Ophthalmologic exam status:UTD Hearing evaluation status:not UTD Orientation: Oriented X 3 Memory and recall: good Spelling testing: good Depression/anxiety assessment: no  Foreign travel history: 2015 [place] Immunization status for influenza/pneumonia/ shingles /tetanus: UTD Transfusion history:no Preventive health care maintenance status: Colonoscopy/BMD/mammogram/Pap as per protocol/standard care:UTD Dental care:every 6 mos Chart reviewed and updated. Active issues reviewed and addressed as documented below.    Review of Systems   She is on heart healthy, low salt diet. Blood pressure at various doctor visits have been in the range of 130/80 or less.  Despite high level of exercise; she has no cardiopulmonary symptoms except she will have nocturnal palpitations approximately once a week. She does not have palpitations with exercise..  Chest pain,  tachycardia, exertional dyspnea, paroxysmal nocturnal dyspnea, claudication or edema are absent.  She questions some possible adverse effects with the generic Femara such as weight gain and mood change  She has no issues with her blood pressure medicines  She is on omeprazole 20 mg twice a day. She does have occasional dysphasia with pasty foods. She also has some bloating. She is not on a probiotic.  She will have mucoid stools with blood on the tissue at times.  She has no unexplained weight loss; she's not had melena.  Her last colonoscopy was 2 years ago.     Objective:   Physical Exam Gen.: Healthy and well-nourished in appearance. Alert, appropriate and cooperative throughout exam. Appears younger than stated age  Head: Normocephalic without obvious abnormalities  Eyes: No corneal or conjunctival inflammation noted. Pupils equal round reactive to light and accommodation. Extraocular motion intact. Fundal exam is benign without hemorrhages, exudate, papilledema.  Vision grossly normal with /w/o lenses Ears: External  ear exam reveals no significant lesions or deformities. Canals clear .TMs normal. Hearing is grossly normal bilaterally. Nose: External nasal exam reveals no deformity or inflammation. Nasal mucosa are pink and moist. No lesions or exudates noted.  Septum to R Mouth: Oral mucosa and oropharynx reveal no lesions or exudates. Teeth in good repair. Neck: No deformities, masses, or tenderness noted. Range of motion decreased. Thyroid normal Lungs: Normal respiratory effort; chest expands symmetrically. Lungs are clear to auscultation without rales, wheezes, or increased work of breathing. Heart: Normal rate and rhythm. Normal S1 and S2. No gallop, click, or rub. No murmur. Abdomen: Bowel sounds normal; abdomen soft and nontender. No masses, organomegaly or hernias noted. Genitalia: as per Gyn                                  Musculoskeletal/extremities: No deformity or scoliosis noted of  the thoracic or lumbar spine. No clubbing, cyanosis, edema, or significant extremity  deformity noted. Range of motion normal .Tone & strength normal. Hand joints normal Fingernail / toenail health good.Minor knee crepitus Able to lie down & sit  up w/o help. Negative SLR bilaterally Vascular: Carotid, radial artery, dorsalis pedis and  posterior tibial pulses are full and equal. No bruits  present. Neurologic: Alert and oriented x3. Deep tendon reflexes symmetrical and normal.  Gait normal .      Skin: Intact without suspicious lesions or rashes. Lymph: No cervical, axillary lymphadenopathy present. Psych: Mood and affect are normal. Normally interactive                                                                                        Assessment & Plan:  See Current Assessment & Plan in Problem List under specific DiagnosisThe labs will be reviewed and risks and options assessed. Written recommendations will be provided by mail or directly through My Chart.Further evaluation or change in medical therapy will be directed by those results.

## 2014-02-19 NOTE — Assessment & Plan Note (Signed)
A1c

## 2014-02-19 NOTE — Patient Instructions (Addendum)
Your next office appointment will be determined based upon review of your pending labs . Those instructions will be transmitted to you through My Chart  Reflux of gastric acid may be asymptomatic as this may occur mainly during sleep.The triggers for reflux  include stress; the "aspirin family" ; alcohol; peppermint; and caffeine (coffee, tea, cola, and chocolate). The aspirin family would include aspirin and the nonsteroidal agents such as ibuprofen &  Naproxen. Tylenol would not cause reflux. If having symptoms ; food & drink should be avoided for @ least 2 hours before going to bed. To prevent palpitations or premature beats, avoid stimulants such as decongestants, diet pills, nicotine, or caffeine (coffee, tea, cola, or chocolate) to excess. Please take the probiotic , Align OR Florastor , every day until the bowels are normal. This will replace the normal bacteria which  are necessary for formation of normal stool and processing of food.

## 2014-02-20 ENCOUNTER — Encounter: Payer: Self-pay | Admitting: Internal Medicine

## 2014-02-20 DIAGNOSIS — C4491 Basal cell carcinoma of skin, unspecified: Secondary | ICD-10-CM | POA: Insufficient documentation

## 2014-03-10 ENCOUNTER — Other Ambulatory Visit: Payer: Self-pay | Admitting: Internal Medicine

## 2014-05-01 ENCOUNTER — Other Ambulatory Visit: Payer: Self-pay | Admitting: Internal Medicine

## 2014-05-27 ENCOUNTER — Encounter: Payer: Self-pay | Admitting: Family Medicine

## 2014-05-27 ENCOUNTER — Other Ambulatory Visit (INDEPENDENT_AMBULATORY_CARE_PROVIDER_SITE_OTHER): Payer: Medicare Other

## 2014-05-27 ENCOUNTER — Ambulatory Visit (INDEPENDENT_AMBULATORY_CARE_PROVIDER_SITE_OTHER): Payer: Medicare Other | Admitting: Family Medicine

## 2014-05-27 VITALS — BP 122/74 | HR 81 | Ht <= 58 in | Wt 118.0 lb

## 2014-05-27 DIAGNOSIS — IMO0002 Reserved for concepts with insufficient information to code with codable children: Secondary | ICD-10-CM

## 2014-05-27 DIAGNOSIS — M25562 Pain in left knee: Secondary | ICD-10-CM

## 2014-05-27 DIAGNOSIS — M129 Arthropathy, unspecified: Secondary | ICD-10-CM

## 2014-05-27 DIAGNOSIS — M1990 Unspecified osteoarthritis, unspecified site: Secondary | ICD-10-CM | POA: Insufficient documentation

## 2014-05-27 NOTE — Patient Instructions (Signed)
Good to see you Turmeric 500mg  twice daily.  Ice 20 minutes 2 times daily. Usually after activity and before bed. Try brace with a lot of walking.  Continue the vitamin D 2000 IU daily.  Exercises 3 times a week.  Try pennsaid twice daily See me again in 3 weeks and if still in pain will try injection.

## 2014-05-27 NOTE — Progress Notes (Signed)
  CC: Knee pain, followup  HPI: Patient is a very pleasant 69 year old female following up with left knee pain. 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 Patient states shows she was doing fairly well but started having more pain. Patient states that when she goes from a seated position to standing she has more pain. Patient still says it seems to be localized mostly of the medial aspect of the knee. States that going up or down stairs can give her some difficulty. Denies any numbness or tingling or any radiation down the leg. Patient denies any weakness. Does not remember any true injury. Continues to do daily activities at this time.  Past medical, surgical, family and social history reviewed. Medications reviewed all in the electronic medical record.   Review of Systems: No headache, visual changes, nausea, vomiting, diarrhea, constipation, dizziness, abdominal pain, skin rash, fevers, chills, night sweats, weight loss, swollen lymph nodes, body aches, joint swelling, muscle aches, chest pain, shortness of breath, mood changes.   Objective:    Blood pressure 122/74, pulse 81, height 4\' 9"  (1.448 m), weight 118 lb (53.524 kg), SpO2 98 %.   General: No apparent distress alert and oriented x3 mood and affect normal, dressed appropriately.  HEENT: Pupils equal, extraocular movements intact Respiratory: Patient's speak in full sentences and does not appear short of breath Cardiovascular: No lower extremity edema, non tender, no erythema Skin: Warm dry intact with no signs of infection or rash on extremities or on axial skeleton. Abdomen: Soft nontender Neuro: Cranial nerves II through XII are intact, neurovascularly intact in all extremities with 2+ DTRs and 2+ pulses. Lymph: No lymphadenopathy of posterior or anterior cervical chain or axillae bilaterally.  Gait normal with good balance and coordination. Does have scoliosis. MSK: Non tender with full range of motion and good  stability and symmetric strength and tone of shoulders, elbows, wrist, hip and ankles bilaterally.  Knee: Left Normal to inspection with no erythema or effusion or obvious bony abnormalities. No tenderness over the medial joint line ROM full in flexion and extension and lower leg rotation. Ligaments with solid consistent endpoints including ACL, PCL, LCL, MCL. Negative Mcmurray's, Apley's, and Thessalonian tests. Non painful patellar compression. Patellar glide with mild crepitus. Patellar and quadriceps tendons unremarkable. Hamstring and quadriceps strength 5 out of 5 Contralateral side unremarkable  MSK US performed of: Left This study was ordered, performed, and interpreted by Charlann Boxer D.O.  Knee: All structures visualized. She does have moderate to severe narrowing of the medial joint line. Patellar Tendon unremarkable on long and transverse views without effusion. No abnormality of prepatellar bursa. LCL and MCL unremarkable on long and transverse views. No abnormality of origin of medial or lateral head of the gastrocnemius.  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

## 2014-05-27 NOTE — Assessment & Plan Note (Signed)
I believe the patient's pain is likely more secondary to osteophytic changes of the knee. I think that patient's hamstrings are tight for compensation. Patient was given home exercises, medial unloader brace, icing protocol and topical anti-inflammatories. We discussed over-the-counter medicines he can be helpful as well. Patient will try to make these changes and come back again in 3 weeks. At that time if continuing to have pain we may need to consider an intra-articular injection. We'll discuss at follow-up. Patient continues to have pain I do think that a repeat x-ray may be necessary. Patient declined one today.  Spent  25 minutes with patient face-to-face and had greater than 50% of counseling including as described above in assessment and plan.

## 2014-05-27 NOTE — Progress Notes (Signed)
Pre visit review using our clinic review tool, if applicable. No additional management support is needed unless otherwise documented below in the visit note. 

## 2014-05-28 ENCOUNTER — Ambulatory Visit: Payer: Medicare Other | Admitting: Family Medicine

## 2014-06-17 ENCOUNTER — Encounter: Payer: Self-pay | Admitting: Family Medicine

## 2014-06-17 ENCOUNTER — Ambulatory Visit (INDEPENDENT_AMBULATORY_CARE_PROVIDER_SITE_OTHER): Payer: Medicare Other | Admitting: Family Medicine

## 2014-06-17 VITALS — BP 130/82 | HR 68 | Ht <= 58 in | Wt 120.0 lb

## 2014-06-17 DIAGNOSIS — IMO0002 Reserved for concepts with insufficient information to code with codable children: Secondary | ICD-10-CM

## 2014-06-17 DIAGNOSIS — M129 Arthropathy, unspecified: Secondary | ICD-10-CM

## 2014-06-17 NOTE — Progress Notes (Signed)
Pre visit review using our clinic review tool, if applicable. No additional management support is needed unless otherwise documented below in the visit note. 

## 2014-06-17 NOTE — Assessment & Plan Note (Signed)
Patient was given an injection today. Discussed with patient taking the next 48 hours often encouraging the icing protocol. Patient and will start the home exercises again regularly. We will see how patient response to this. We discussed the over-the-counter medications again in greater detail. Patient is to try this and come back in 4 weeks. At that time if continuing to have discomfort she may be a candidate for viscous supplementation. Repeat x-rays may also be necessary.  Spent  25 minutes with patient face-to-face and had greater than 50% of counseling including as described above in assessment and plan.

## 2014-06-17 NOTE — Progress Notes (Signed)
  CC: Knee pain, followup  HPI: Patient is a very pleasant 69 year old female following up with left knee pain. Patient actually had more of a osteophytic findings of the knee. Patient continued to have some catching sensation from time to time. Patient states that it seems to be worse when going from seated to standing position. States that she stands for long amount of time and is disc uncomfortable as well. Denies any radiation. Still seems to be localized. This pain is not stopping her from any other activities.  Past medical, surgical, family and social history reviewed. Medications reviewed all in the electronic medical record.   Review of Systems: No headache, visual changes, nausea, vomiting, diarrhea, constipation, dizziness, abdominal pain, skin rash, fevers, chills, night sweats, weight loss, swollen lymph nodes, body aches, joint swelling, muscle aches, chest pain, shortness of breath, mood changes.   Objective:    Blood pressure 130/82, pulse 68, height 4\' 9"  (1.448 m), weight 120 lb (54.432 kg).   General: No apparent distress alert and oriented x3 mood and affect normal, dressed appropriately.  HEENT: Pupils equal, extraocular movements intact Respiratory: Patient's speak in full sentences and does not appear short of breath Cardiovascular: No lower extremity edema, non tender, no erythema Skin: Warm dry intact with no signs of infection or rash on extremities or on axial skeleton. Abdomen: Soft nontender Neuro: Cranial nerves II through XII are intact, neurovascularly intact in all extremities with 2+ DTRs and 2+ pulses. Lymph: No lymphadenopathy of posterior or anterior cervical chain or axillae bilaterally.  Gait normal with good balance and coordination. Does have scoliosis. MSK: Non tender with full range of motion and good stability and symmetric strength and tone of shoulders, elbows, wrist, hip and ankles bilaterally.  Knee: Left Normal to inspection with no erythema or  effusion or obvious bony abnormalities. No tenderness over the medial joint line ROM full in flexion and extension and lower leg rotation. Ligaments with solid consistent endpoints including ACL, PCL, LCL, MCL. Negative Mcmurray's, Apley's, and Thessalonian tests. Non painful patellar compression. Patellar glide with mild crepitus. Patellar and quadriceps tendons unremarkable. Hamstring and quadriceps strength 5 out of 5 Contralateral side unremarkable   C2 note After informed written and verbal consent, patient was seated on exam table. Left knee was prepped with alcohol swab and utilizing anterolateral approach, patient's left knee space was injected with 4:1  marcaine 0.5%: Kenalog 40mg /dL. Patient tolerated the procedure well without immediate complications.    Impression and Recommendations:     This case required medical decision making of moderate complexity.

## 2014-06-17 NOTE — Patient Instructions (Signed)
Good to see you Ice in 6 hours and still at night I like the tart cherry but turmeric is ok if you space it out from the prilosec.  Start exercises again in 2 days.  See me again in 3-4 weeks and if not perfect we will consider the lubricating injections.

## 2014-06-27 ENCOUNTER — Other Ambulatory Visit: Payer: Self-pay | Admitting: Nurse Practitioner

## 2014-07-15 ENCOUNTER — Encounter: Payer: Self-pay | Admitting: Family Medicine

## 2014-07-15 ENCOUNTER — Ambulatory Visit (INDEPENDENT_AMBULATORY_CARE_PROVIDER_SITE_OTHER): Payer: Medicare Other | Admitting: Family Medicine

## 2014-07-15 VITALS — BP 122/68 | HR 76 | Ht <= 58 in | Wt 117.0 lb

## 2014-07-15 DIAGNOSIS — IMO0002 Reserved for concepts with insufficient information to code with codable children: Secondary | ICD-10-CM

## 2014-07-15 DIAGNOSIS — M129 Arthropathy, unspecified: Secondary | ICD-10-CM

## 2014-07-15 NOTE — Progress Notes (Signed)
  CC: Knee pain, followup  HPI: Patient is a very pleasant 69 year old female following up with left knee pain. Patient actually had more of a osteoarthritis findings of the knee. Patient was given a steroid injection at her last visit. Patient continues with the conservative therapy including icing, bracing, home exercises and topical anti-inflammatories. Patient states overall she is 90% better. Denies any numbness. Denies any giving out on her. Patient is very results. Able to do all daily activities.  Past medical, surgical, family and social history reviewed. Medications reviewed all in the electronic medical record.   Review of Systems: No headache, visual changes, nausea, vomiting, diarrhea, constipation, dizziness, abdominal pain, skin rash, fevers, chills, night sweats, weight loss, swollen lymph nodes, body aches, joint swelling, muscle aches, chest pain, shortness of breath, mood changes.   Objective:    Blood pressure 122/68, pulse 76, height 4\' 9"  (1.448 m), weight 117 lb (53.071 kg), SpO2 96 %.   General: No apparent distress alert and oriented x3 mood and affect normal, dressed appropriately.  HEENT: Pupils equal, extraocular movements intact Respiratory: Patient's speak in full sentences and does not appear short of breath Cardiovascular: No lower extremity edema, non tender, no erythema Skin: Warm dry intact with no signs of infection or rash on extremities or on axial skeleton. Abdomen: Soft nontender Neuro: Cranial nerves II through XII are intact, neurovascularly intact in all extremities with 2+ DTRs and 2+ pulses. Lymph: No lymphadenopathy of posterior or anterior cervical chain or axillae bilaterally.  Gait normal with good balance and coordination. Does have scoliosis. MSK: Non tender with full range of motion and good stability and symmetric strength and tone of shoulders, elbows, wrist, hip and ankles bilaterally.  Knee: Left Normal to inspection with no erythema or  effusion or obvious bony abnormalities. No tenderness over the medial joint line ROM full in flexion and extension and lower leg rotation. Ligaments with solid consistent endpoints including ACL, PCL, LCL, MCL. Negative Mcmurray's, Apley's, and Thessalonian tests. Non painful patellar compression. Patellar glide with mild crepitus. Patellar and quadriceps tendons unremarkable. Hamstring and quadriceps strength 5 out of 5 Contralateral side unremarkable    Impression and Recommendations:     This case required medical decision making of moderate complexity.

## 2014-07-15 NOTE — Assessment & Plan Note (Signed)
Doing well with conservative therapy. We discussed icing regimen. We discussed home exercises. Patient will follow-up as needed.

## 2014-07-15 NOTE — Progress Notes (Signed)
Pre visit review using our clinic review tool, if applicable. No additional management support is needed unless otherwise documented below in the visit note. 

## 2014-07-15 NOTE — Patient Instructions (Addendum)
Good to see you I am proud of you Stay active!!! Ice still at the end of the day.  Continue the vitamins and consider glucosamine 1500 mg daily See me again when you need me.

## 2014-09-02 ENCOUNTER — Other Ambulatory Visit: Payer: Self-pay | Admitting: Internal Medicine

## 2014-09-17 ENCOUNTER — Other Ambulatory Visit: Payer: Self-pay | Admitting: Internal Medicine

## 2014-10-28 ENCOUNTER — Other Ambulatory Visit: Payer: Self-pay | Admitting: Emergency Medicine

## 2014-10-28 ENCOUNTER — Other Ambulatory Visit: Payer: Self-pay | Admitting: Internal Medicine

## 2014-10-28 ENCOUNTER — Other Ambulatory Visit: Payer: Self-pay

## 2014-10-28 MED ORDER — AMLODIPINE BESYLATE 5 MG PO TABS: 5.0000 mg | ORAL_TABLET | Freq: Every day | ORAL | Status: DC

## 2014-11-27 ENCOUNTER — Encounter: Payer: Self-pay | Admitting: *Deleted

## 2014-12-04 ENCOUNTER — Other Ambulatory Visit: Payer: Self-pay | Admitting: Internal Medicine

## 2014-12-20 ENCOUNTER — Other Ambulatory Visit: Payer: Self-pay | Admitting: *Deleted

## 2014-12-20 DIAGNOSIS — C50911 Malignant neoplasm of unspecified site of right female breast: Secondary | ICD-10-CM

## 2014-12-20 DIAGNOSIS — C50912 Malignant neoplasm of unspecified site of left female breast: Secondary | ICD-10-CM

## 2014-12-23 ENCOUNTER — Other Ambulatory Visit (HOSPITAL_BASED_OUTPATIENT_CLINIC_OR_DEPARTMENT_OTHER): Payer: Medicare Other

## 2014-12-23 ENCOUNTER — Other Ambulatory Visit: Payer: Medicare Other

## 2014-12-23 DIAGNOSIS — C50919 Malignant neoplasm of unspecified site of unspecified female breast: Secondary | ICD-10-CM | POA: Diagnosis not present

## 2014-12-23 DIAGNOSIS — C50911 Malignant neoplasm of unspecified site of right female breast: Secondary | ICD-10-CM

## 2014-12-23 DIAGNOSIS — C50912 Malignant neoplasm of unspecified site of left female breast: Secondary | ICD-10-CM

## 2014-12-23 LAB — COMPREHENSIVE METABOLIC PANEL (CC13)
ALT: 49 U/L (ref 0–55)
AST: 26 U/L (ref 5–34)
Albumin: 4 g/dL (ref 3.5–5.0)
Alkaline Phosphatase: 46 U/L (ref 40–150)
Anion Gap: 10 mEq/L (ref 3–11)
BUN: 17.7 mg/dL (ref 7.0–26.0)
CALCIUM: 9.3 mg/dL (ref 8.4–10.4)
CHLORIDE: 108 meq/L (ref 98–109)
CO2: 25 mEq/L (ref 22–29)
CREATININE: 0.7 mg/dL (ref 0.6–1.1)
EGFR: 81 mL/min/{1.73_m2} — ABNORMAL LOW (ref >90)
Glucose: 130 mg/dl (ref 70–140)
Potassium: 4.3 mEq/L (ref 3.5–5.1)
Sodium: 143 mEq/L (ref 136–145)
Total Bilirubin: 0.58 mg/dL (ref 0.20–1.20)
Total Protein: 6.7 g/dL (ref 6.4–8.3)

## 2014-12-23 LAB — CBC WITH DIFFERENTIAL/PLATELET
BASO%: 1.6 % (ref 0.0–2.0)
Basophils Absolute: 0.1 10*3/uL (ref 0.0–0.1)
EOS%: 2.8 % (ref 0.0–7.0)
Eosinophils Absolute: 0.1 10*3/uL (ref 0.0–0.5)
HEMATOCRIT: 39.1 % (ref 34.8–46.6)
HGB: 12.6 g/dL (ref 11.6–15.9)
LYMPH#: 0.9 10*3/uL (ref 0.9–3.3)
LYMPH%: 21.1 % (ref 14.0–49.7)
MCH: 30 pg (ref 25.1–34.0)
MCHC: 32.3 g/dL (ref 31.5–36.0)
MCV: 92.7 fL (ref 79.5–101.0)
MONO#: 0.4 10*3/uL (ref 0.1–0.9)
MONO%: 8.5 % (ref 0.0–14.0)
NEUT%: 66 % (ref 38.4–76.8)
NEUTROS ABS: 2.9 10*3/uL (ref 1.5–6.5)
Platelets: 221 10*3/uL (ref 145–400)
RBC: 4.22 10*6/uL (ref 3.70–5.45)
RDW: 13.8 % (ref 11.2–14.5)
WBC: 4.4 10*3/uL (ref 3.9–10.3)

## 2014-12-24 ENCOUNTER — Other Ambulatory Visit: Payer: Self-pay | Admitting: Internal Medicine

## 2014-12-25 ENCOUNTER — Other Ambulatory Visit: Payer: Self-pay | Admitting: Emergency Medicine

## 2014-12-25 MED ORDER — OMEPRAZOLE 20 MG PO CPDR: 20.0000 mg | DELAYED_RELEASE_CAPSULE | Freq: Two times a day (BID) | ORAL | Status: DC

## 2014-12-25 MED ORDER — AMLODIPINE BESYLATE 5 MG PO TABS: 5.0000 mg | ORAL_TABLET | Freq: Every day | ORAL | Status: DC

## 2014-12-30 ENCOUNTER — Telehealth: Payer: Self-pay | Admitting: Oncology

## 2014-12-30 ENCOUNTER — Ambulatory Visit (HOSPITAL_BASED_OUTPATIENT_CLINIC_OR_DEPARTMENT_OTHER): Payer: Medicare Other | Admitting: Oncology

## 2014-12-30 VITALS — BP 130/69 | HR 63 | Temp 98.6°F | Resp 17 | Ht <= 58 in | Wt 113.1 lb

## 2014-12-30 DIAGNOSIS — Z79811 Long term (current) use of aromatase inhibitors: Secondary | ICD-10-CM | POA: Diagnosis not present

## 2014-12-30 DIAGNOSIS — C50919 Malignant neoplasm of unspecified site of unspecified female breast: Secondary | ICD-10-CM

## 2014-12-30 DIAGNOSIS — C50911 Malignant neoplasm of unspecified site of right female breast: Secondary | ICD-10-CM | POA: Diagnosis not present

## 2014-12-30 DIAGNOSIS — Z170 Estrogen receptor positive status [ER+]: Secondary | ICD-10-CM

## 2014-12-30 DIAGNOSIS — C50912 Malignant neoplasm of unspecified site of left female breast: Secondary | ICD-10-CM

## 2014-12-30 DIAGNOSIS — M8580 Other specified disorders of bone density and structure, unspecified site: Secondary | ICD-10-CM

## 2014-12-30 MED ORDER — LETROZOLE 2.5 MG PO TABS: 2.5000 mg | ORAL_TABLET | Freq: Every day | ORAL | Status: DC

## 2014-12-30 MED ORDER — GABAPENTIN 300 MG PO CAPS: 300.0000 mg | ORAL_CAPSULE | Freq: Every day | ORAL | Status: DC

## 2014-12-30 NOTE — Telephone Encounter (Signed)
Gave avs & calendar for September/August

## 2014-12-30 NOTE — Addendum Note (Signed)
Addended by: Laureen Abrahams on: 12/30/2014 03:00 PM   Modules accepted: Orders, Medications

## 2014-12-30 NOTE — Progress Notes (Signed)
Courtney [name]  [phone] Fax:(336) [phone]     ID: Courtney [name] DOB: 18-Aug-1945  MR#: [id number]  KYH#:[id number]  Patient Care Team: Hendricks Limes, MD as PCP - General Chauncey Cruel, MD as Consulting Physician (Oncology)   CHIEF COMPLAINT: bilateral breast cancer  CURRENT TREATMENT: letrozole   BREAST CANCER HISTORY: From doctor Dana Allan 06/14/2011 summary:  "#1 status post bilateral mastectomies on 01/13/2011. Patient had a left simple mastectomy that revealed 2 foci of invasive ductal carcinoma one measuring 1.2 cm and a second focus measuring 0.5 cm grade 2 with associated DCIS and LV I. 8 sentinel nodes were negative for metastatic disease. Patient had a right simple mastectomy that showed a 1.3 in centimeter invasive ductal carcinoma grade 2 with associated DCIS no LV I. One sentinel node was negative for metastatic disease. Tumor #1 was estrogen receptor +100% progesterone receptor 100% proliferation marker 14% HER-2/neu negative. Tumor #2 was ER +100% PR +98% proliferation marker 13% and HER-2/neu negative.  #2 patient Had Oncotype DX performed on the largest tumor at the score was 10 giving her a 7% risk of recurrence at 5 years with antiestrogen therapy only.  #3 patient has had bilateral reconstruction performed. As is being done by Dr. Crissie Reese.  #4 patient was started on Letrozole mg daily in October 2012. A total of 5 years as planned."  The patient's subsequent history is as detailed below  INTERVAL HISTORY: Courtney [name] returns for followup of her bilateral estrogen receptor positive breast cancers. She continues on letrozole, with good tolerance. She has rare hot flashes. She is achy, but mostly in the hips. This may be due to the fact that she has been doing a lot of traveling this summer [place], [place], [place]) and has not been doing her usual water aerobic and your exercises. She obtains a drug at a very good cost.  REVIEW OF  SYSTEMS: She has a dry cough at times. She has stress urinary incontinence. She had significant problems with the Zometa which she took last year. She felt like she had the flu for a week. She had nausea, though no vomiting, and significant aches and pains. A detailed review of systems today was otherwise noncontributory  PAST MEDICAL HISTORY: Past Medical History  Diagnosis Date  . GERD (gastroesophageal reflux disease)     S/P dilation X 1  . Reactive airway disease   . Night sweats   . Family history of breast cancer   . Hypertension   . Breast cancer July 2012    post double mastectomy; Dr Humphrey Rolls  . Diverticulosis     PAST SURGICAL HISTORY: Past Surgical History  Procedure Laterality Date  . Nasal septum surgery    . Thyroglossal duct cyst    . Abdominal hysterectomy    . Breast surgery  01/13/11    bilateral total mastectomy; Dr Dalbert Batman  . Bilateral breast reconstruction  Jan 2013  . Reconstruction / correction of nipple / aerola  June 2013  . Colonoscopy  2014    Dr Olevia Perches; hyperplastic polyp  . Esophageal dilation  2014    FAMILY HISTORY Family History  Problem Relation Age of Onset  . Breast cancer Mother   . Colon cancer Mother 109  . Heart attack Father     > 3  . Diabetes Father     probable pre DM  . Cancer Sister     appendiceal  . Heart attack Maternal Grandfather     >  22  . Stroke Neg Hx   . Heart attack Paternal Uncle     X62 ; ? age   the patient's father died from a myocardial infarction in his 40s. The patient's mother was diagnosed with breast cancer in her 62s and with colon cancer in her 105s. She died at age 53. The patient had no brothers. She had 2 sisters neither her would breast or ovarian cancer.  GYNECOLOGIC HISTORY:  No LMP recorded. Patient is postmenopausal. Menarche age 46, first live birth age 46, the patient is Courtney [name]. She underwent total abdominal hysterectomy and bilateral salpingo-oophorectomy at age 47. She took on one replacement  until her breast cancer diagnosis and 2012.  SOCIAL HISTORY:  He worked as a Water engineer. Her husband of 74 years, home are C. "Butch" Mcguinness, is a Pharmacist, community. Their son Carlis Abbott lives in [place] and works for [employer] and Engineer, mining. Son Aaron Edelman lives in [place] and is a Patent examiner. The patient has one biological grandson and 2 step grandchildren. She attends breast [employer]    ADVANCED DIRECTIVES: In place. The patient has a living will and her husband is her healthcare power of attorney   HEALTH MAINTENANCE: Social History  Substance Use Topics  . Smoking status: Former Smoker    Quit date: 05/03/1965  . Smokeless tobacco: Never Used     Comment: smoked 1962-1967, up to < 1 ppd  . Alcohol Use: 8.4 oz/week    14 Glasses of wine per week     Comment: 2-3 /day     Colonoscopy: 2012/ [facility]  PAP:  Bone density: 11/05/2013 at [facility]; D- 1.4; showed a statistically significant decrease in bone mineral density of the right femur of 4.3% as compared to 2013  Lipid panel:  No Known Allergies  Current Outpatient Prescriptions  Medication Sig Dispense Refill  . amLODipine (NORVASC) 5 MG tablet Take 1 tablet (5 mg total) by mouth daily. --No further refills until office visit is scheduled. 30 tablet 1  . Ascorbic Acid (VITAMIN C) 1000 MG tablet Take 1,000 mg by mouth daily.    Marland Kitchen aspirin 81 MG tablet Take 81 mg by mouth daily.      . ASTRAGALUS PO Take 470 mg by mouth daily.    . Azelaic Acid (FINACEA EX) Apply topically as directed.    . calcium citrate-vitamin D (CITRACAL+D) 315-200 MG-UNIT per tablet Take 1 tablet by mouth daily.    . Cholecalciferol (VITAMIN D3) 1000 UNITS CAPS Take by mouth daily.      Marland Kitchen co-enzyme Q-10 30 MG capsule Take 50 mg by mouth daily.     . fluticasone (FLONASE) 50 MCG/ACT nasal spray Place 1 spray into the nose 2 (two) times daily as needed for rhinitis. 16 g 11  . gabapentin (NEURONTIN) 300 MG capsule Take 1 capsule (300 mg total) by mouth at  bedtime. 90 capsule 4  . Ginseng (ELEUTHERO PO) Take by mouth as directed.    Marland Kitchen HORSE CHESTNUT PO Take 250 mg by mouth daily.      Marland Kitchen letrozole (FEMARA) 2.5 MG tablet Take 1 tablet (2.5 mg total) by mouth daily. 90 tablet 10  . losartan-hydrochlorothiazide (HYZAAR) 100-12.5 MG per tablet TAKE 1 TABLET BY MOUTH ONCE DAILY 30 tablet 5  . montelukast (SINGULAIR) 10 MG tablet TAKE 1 TABLET BY MOUTH AT BEDTIME 30 tablet 5  . Multiple Vitamins-Minerals (MULTIVITAMIN WITH MINERALS) tablet Take 2 tablets by mouth daily.      . OMEGA 3  1000 MG CAPS Take 1,000 mg by mouth daily.      Marland Kitchen omeprazole (PRILOSEC) 20 MG capsule Take 1 capsule (20 mg total) by mouth 2 (two) times daily. --No further refills until office visit is scheduled. 60 capsule 1  . psyllium (METAMUCIL) 58.6 % powder Take 1 packet by mouth daily.    Marland Kitchen pyridOXINE (VITAMIN B-6) 50 MG tablet Take 50 mg by mouth daily.    . SYMBICORT 160-4.5 MCG/ACT inhaler INHALE 1 OR 2 PUFFS EVERY 12 HOURS. GARGLE AND SPIT AFTER EACH USE. 10.2 g 5  . Tragacanth (ASTRAGALUS ROOT) POWD by Does not apply route.    . tretinoin (RETIN-A) 0.025 % cream Apply topically at bedtime.     No current facility-administered medications for this visit.    OBJECTIVE: Middle-aged white woman in no acute distress Filed Vitals:   12/30/14 1354  BP: 130/69  Pulse: 63  Temp: 98.6 F (37 C)  Resp: 17     Body mass index is 24.47 kg/(m^2).    ECOG FS:0 - Asymptomatic  Sclerae unicteric, pupils round and equal Oropharynx clear and moist-- no thrush or other lesions No cervical or supraclavicular adenopathy Lungs no rales or rhonchi Heart regular rate and rhythm Abd soft, nontender, positive bowel sounds MSK no focal spinal tenderness, no upper extremity lymphedema Neuro: nonfocal, well oriented, appropriate affect Breasts: Status post bilateral mastectomies, with bilateral implant reconstruction. There is no evidence of chest wall recurrence. Both axillae are  benign.   LAB RESULTS:  CMP     Component Value Date/Time   NA 143 12/23/2014 1342   NA 142 12/23/2011 1055   K 4.3 12/23/2014 1342   K 4.4 12/23/2011 1055   CL 104 06/08/2012 1202   CL 106 12/23/2011 1055   CO2 25 12/23/2014 1342   CO2 29 12/23/2011 1055   GLUCOSE 130 12/23/2014 1342   GLUCOSE 105* 06/08/2012 1202   GLUCOSE 105* 12/23/2011 1055   BUN 17.7 12/23/2014 1342   BUN 19 12/23/2011 1055   CREATININE 0.7 12/23/2014 1342   CREATININE 0.66 12/23/2011 1055   CALCIUM 9.3 12/23/2014 1342   CALCIUM 9.4 12/23/2011 1055   PROT 6.7 12/23/2014 1342   PROT 6.8 12/23/2011 1055   ALBUMIN 4.0 12/23/2014 1342   ALBUMIN 4.2 12/23/2011 1055   AST 26 12/23/2014 1342   AST 22 12/23/2011 1055   ALT 49 12/23/2014 1342   ALT 27 12/23/2011 1055   ALKPHOS 46 12/23/2014 1342   ALKPHOS 42 12/23/2011 1055   BILITOT 0.58 12/23/2014 1342   BILITOT 0.6 12/23/2011 1055   GFRNONAA >60 01/11/2011 1407   GFRAA >60 01/11/2011 1407    I No results found for: SPEP  Lab Results  Component Value Date   WBC 4.4 12/23/2014   NEUTROABS 2.9 12/23/2014   HGB 12.6 12/23/2014   HCT 39.1 12/23/2014   MCV 92.7 12/23/2014   PLT 221 12/23/2014      Chemistry      Component Value Date/Time   NA 143 12/23/2014 1342   NA 142 12/23/2011 1055   K 4.3 12/23/2014 1342   K 4.4 12/23/2011 1055   CL 104 06/08/2012 1202   CL 106 12/23/2011 1055   CO2 25 12/23/2014 1342   CO2 29 12/23/2011 1055   BUN 17.7 12/23/2014 1342   BUN 19 12/23/2011 1055   CREATININE 0.7 12/23/2014 1342   CREATININE 0.66 12/23/2011 1055      Component Value Date/Time   CALCIUM 9.3 12/23/2014 1342  CALCIUM 9.4 12/23/2011 1055   ALKPHOS 46 12/23/2014 1342   ALKPHOS 42 12/23/2011 1055   AST 26 12/23/2014 1342   AST 22 12/23/2011 1055   ALT 49 12/23/2014 1342   ALT 27 12/23/2011 1055   BILITOT 0.58 12/23/2014 1342   BILITOT 0.6 12/23/2011 1055       Lab Results  Component Value Date   LABCA2 19 11/18/2010     No components found for: [id number]  No results for input(s): INR in the last 168 hours.  Urinalysis    Component Value Date/Time   COLORURINE YELLOW 01/11/2011 1406   APPEARANCEUR CLEAR 01/11/2011 1406   LABSPEC 1.026 01/11/2011 1406   PHURINE 5.5 01/11/2011 1406   GLUCOSEU NEGATIVE 01/11/2011 1406   [place] 01/11/2011 1406   [place] 01/11/2011 1406   KETONESUR NEGATIVE 01/11/2011 1406   PROTEINUR NEGATIVE 01/11/2011 1406   UROBILINOGEN 0.2 01/11/2011 1406   NITRITE NEGATIVE 01/11/2011 1406   LEUKOCYTESUR NEGATIVE 01/11/2011 1406    STUDIES: No results found.  ASSESSMENT: 69 y.o. [BRCA negative] [place] woman status post bilateral breast biopsies 11/30/2010, both showing invasive ductal carcinoma, both showing strong estrogen and progesterone receptor positivity, low MIB-1-once (13 or less) and no HER-2 amplification.  (1) status post bilateral mastectomies with bilateral sentinel lymph node sampling 01/13/2011, showing  (a) on the left, 2 foci of invasive ductal carcinoma, the larger measuring 1.2 cm, grade 2, both tumors being strongly estrogen and progesterone receptor positive, with MIB-once of 13-14%, and HER-2/neu negative. Her (pT1c pN0 = stage IA)  (b) on the right, apT1c pN0, stage IA invasive ductal carcinoma, grade 2, estrogen receptor 100% positive, progesterone receptor 9% positive, with an MIB-1 of 12% and no HER-2 amplification  (2) Oncotype DX on the largest tumor showed a score of 10, suggesting a risk of recurrence outside of the breast within the next 10 years would be 7% if the patient only systemic therapy was tamoxifen for 5 years. It also predicts no benefit from chemotherapy  (3) letrozole was started October of 2012  (4) Osteopenia, with T score of -1.4 on bone density at [facility] 11/05/2013.   PLAN: Courtney [name] is now 4 years out from her definitive surgery, with no evidence of disease recurrence. This is very favorable.  The overall  plan is to continue letrozole one more year, after which she will "adjuvant" from follow-up with me. She will have the option of continuing through the survivorship program.  We discussed whether or not she wanted to receive a second dose of zolendronate. She tells me that she would be willing to try again. I suggested she start loratadine 2 days before the treatment and continue at least through 2 days posttreatment. Frequently that can help with symptoms. She will have that dose on September 13.  I think once she goes back to [place] will some of her hip pain and will improve.  She is considering the "Josph Macho" laser therapy for vaginal dryness. There is very little data for this, but I do have 2 patients who have had very good results. This is currently out of pocket and expensive. She thinks she may be able to get it approved through insurance and if so she will let us know and as an interesting I will be glad to put her referral in for her.  Chauncey Cruel, MD   12/30/2014 2:24 PM

## 2014-12-31 ENCOUNTER — Telehealth: Payer: Self-pay | Admitting: Internal Medicine

## 2014-12-31 MED ORDER — BENZONATATE 200 MG PO CAPS: 200.0000 mg | ORAL_CAPSULE | Freq: Three times a day (TID) | ORAL | Status: DC

## 2014-12-31 NOTE — Telephone Encounter (Signed)
Pt has persistent cough especially at night and she is requesting some cough syrup so she can get some sleep. Pharmacy is [facility] on [address] Please let her know

## 2014-12-31 NOTE — Telephone Encounter (Signed)
She ought to come in this afternoon

## 2014-12-31 NOTE — Telephone Encounter (Signed)
Please advise if pt needs office visit, has not been seen since 10/15

## 2014-12-31 NOTE — Telephone Encounter (Signed)
RX for Courtney [name] has been sent to pharm for cough

## 2015-01-03 ENCOUNTER — Ambulatory Visit (INDEPENDENT_AMBULATORY_CARE_PROVIDER_SITE_OTHER): Payer: Medicare Other | Admitting: Internal Medicine

## 2015-01-03 ENCOUNTER — Encounter: Payer: Self-pay | Admitting: Internal Medicine

## 2015-01-03 ENCOUNTER — Other Ambulatory Visit (INDEPENDENT_AMBULATORY_CARE_PROVIDER_SITE_OTHER): Payer: Medicare Other

## 2015-01-03 VITALS — BP 130/72 | HR 64 | Temp 98.6°F | Resp 16 | Ht <= 58 in | Wt 114.0 lb

## 2015-01-03 DIAGNOSIS — E785 Hyperlipidemia, unspecified: Secondary | ICD-10-CM

## 2015-01-03 DIAGNOSIS — Z23 Encounter for immunization: Secondary | ICD-10-CM

## 2015-01-03 DIAGNOSIS — Z0000 Encounter for general adult medical examination without abnormal findings: Secondary | ICD-10-CM

## 2015-01-03 DIAGNOSIS — J209 Acute bronchitis, unspecified: Secondary | ICD-10-CM

## 2015-01-03 DIAGNOSIS — M8580 Other specified disorders of bone density and structure, unspecified site: Secondary | ICD-10-CM

## 2015-01-03 DIAGNOSIS — M859 Disorder of bone density and structure, unspecified: Secondary | ICD-10-CM | POA: Diagnosis not present

## 2015-01-03 DIAGNOSIS — R7301 Impaired fasting glucose: Secondary | ICD-10-CM

## 2015-01-03 DIAGNOSIS — I10 Essential (primary) hypertension: Secondary | ICD-10-CM

## 2015-01-03 DIAGNOSIS — R1310 Dysphagia, unspecified: Secondary | ICD-10-CM | POA: Diagnosis not present

## 2015-01-03 LAB — LIPID PANEL
CHOL/HDL RATIO: 3
Cholesterol: 196 mg/dL (ref 0–200)
HDL: 56.6 mg/dL (ref >39.00)
LDL CALC: 125 mg/dL — AB (ref 0–99)
NONHDL: 139.08
Triglycerides: 69 mg/dL (ref 0.0–149.0)
VLDL: 13.8 mg/dL (ref 0.0–40.0)

## 2015-01-03 LAB — HEMOGLOBIN A1C: HEMOGLOBIN A1C: 5.8 % (ref 4.6–6.5)

## 2015-01-03 LAB — VITAMIN D 25 HYDROXY (VIT D DEFICIENCY, FRACTURES): VITD: 76.44 ng/mL (ref 30.00–100.00)

## 2015-01-03 LAB — TSH: TSH: 1.09 u[IU]/mL (ref 0.35–4.50)

## 2015-01-03 MED ORDER — AZITHROMYCIN 250 MG PO TABS: ORAL_TABLET | ORAL | Status: DC

## 2015-01-03 NOTE — Assessment & Plan Note (Addendum)
Zometa ordered by Dr Jana Hakim Vitamin D level

## 2015-01-03 NOTE — Progress Notes (Signed)
Pre visit review using our clinic review tool, if applicable. No additional management support is needed unless otherwise documented below in the visit note. 

## 2015-01-03 NOTE — Assessment & Plan Note (Signed)
A1c

## 2015-01-03 NOTE — Progress Notes (Signed)
Subjective:    Patient ID: Courtney [name], female    DOB: 08-Nov-1945, 69 y.o.   MRN: [id number]  HPI  Medicare Wellness Visit: Psychosocial and medical history were reviewed as required by Medicare (history related to abuse, antisocial behavior , firearm risk). Social history: Caffeine: 10 cups/week Alcohol: 2 drinks/ day Tobacco use: quit [place] below Personal safety/fall risk:no Limitations of activities of daily living:no Seatbelt/ smoke alarm use:yes Healthcare Power of Attorney/Living Will status and End of Life process assessment : UTD Ophthalmologic exam status:UTD Hearing evaluation status:not UTD Orientation: Oriented X 3 Memory and recall: good Spelling testing: good Depression/anxiety assessment: no Foreign travel history: [place] 2014 Immunization status for influenza/pneumonia/ shingles /tetanus:as per CMA Transfusion history: no Preventive health care maintenance status: Colonoscopy/BMD/mammogram/Pap as per protocol/standard care: UTD Dental care: every 4 months Chart reviewed and updated. Active issues reviewed and addressed as documented below.  Active issues include recent respiratory tract infection. This began 2 weeks ago while she was in [place] as sneezing and scratchy throat. She is now producing some clear/white sputum including some pearls. She's been using her Symbicort for wheezing. She has no upper respiratory tract infection symptoms. She has been using nasal hygiene.  She is on a heart healthy diet. She walks some and is in water aerobics 3-4 times per week as well as yoga once a week. She has no associated cardio pulmonary symptoms.  Colonoscopy is up-to-date. She does have some dysphagia approximately once a week. This has improved significantly. She has a history of previous dilation of the esophagus.  She may have some thinning of the hair.  Her Oncologist has prescribed Zometa because of her antiestrogen therapy.   Review of  Systems  Frontal headache, facial pain , nasal purulence, dental pain, sore throat , otic pain or otic discharge denied. No fever , chills or sweats. Chest pain, palpitations, tachycardia, exertional dyspnea, paroxysmal nocturnal dyspnea, claudication or edema are absent. No unexplained weight loss, abdominal pain, significant dyspepsia,  melena, rectal bleeding, or persistently small caliber stools. Dysuria, pyuria, hematuria, frequency, nocturia or polyuria are denied. Change in skin or nails denied. No bowel changes of constipation or diarrhea. No intolerance to heat or cold.     Objective:   Physical Exam  Pertinent or positive findings include: Slight ptosis bilaterally. Minor crepitus of knees present. General appearance :adequately nourished; in no distress.  Eyes: No conjunctival inflammation or scleral icterus is present.  Oral exam:  Lips and gums are healthy appearing.There is no oropharyngeal erythema or exudate noted. Dental hygiene is good.  Heart:  Normal rate and regular rhythm. S1 and S2 normal without gallop, murmur, click, rub or other extra sounds    Lungs:Chest clear to auscultation; no wheezes, rhonchi,rales ,or rubs present.No increased work of breathing.   Abdomen: bowel sounds normal, soft and non-tender without masses, organomegaly or hernias noted.  No guarding or rebound.   Vascular : all pulses equal ; no bruits present.  Skin:Warm & dry.  Intact without suspicious lesions or rashes ; no tenting or jaundice   Lymphatic: No lymphadenopathy is noted about the head, neck, axilla.   Neuro: Strength, tone & DTRs normal.    Assessment & Plan:  #1 Medicare Wellness Exam; criteria met ; data entered #2 See Current Assessment & Plan in Problem List under specific Diagnosis. The labs will be reviewed and risks and options assessed.  Written recommendations will be provided by mail or directly through My Chart. Further evaluation or  change in medical therapy  will be directed by those results.

## 2015-01-03 NOTE — Assessment & Plan Note (Signed)
Blood pressure goals reviewed. BMET current 

## 2015-01-03 NOTE — Assessment & Plan Note (Signed)
CBC & dif current Antireflux symptoms interventions discussed GI referral if progressive

## 2015-01-03 NOTE — Patient Instructions (Signed)
Reflux of gastric acid may be asymptomatic as this may occur mainly during sleep.The triggers for reflux  include stress; the "aspirin family" ; alcohol; peppermint; and caffeine (coffee, tea, cola, and chocolate). The aspirin family would include aspirin and the nonsteroidal agents such as ibuprofen &  Naproxen. Tylenol would not cause reflux. If having symptoms ; food & drink should be avoided for @ least 2 hours before going to bed.   Plain Mucinex (NOT D) for thick secretions ;force NON dairy fluids .   Nasal cleansing in the shower as discussed with lather of mild shampoo.After 10 seconds wash off lather while  exhaling through nostrils. Make sure that all residual soap is removed to prevent irritation.  Flonase OR Nasacort AQ 1 spray in each nostril twice a day as needed. Use the "crossover" technique into opposite nostril spraying toward opposite ear @ 45 degree angle, not straight up into nostril.  Plain Allegra (NOT D )  160 daily , Loratidine 10 mg , OR Zyrtec 10 mg @ bedtime  as needed for itchy eyes & sneezing.  Your next office appointment will be determined based upon review of your pending labs .  Those written interpretation of the lab results and instructions will be transmitted to you by My Chart  Critical results will be called.   Followup as needed for any active or acute issue. Please report any significant change in your symptoms.

## 2015-01-03 NOTE — Assessment & Plan Note (Signed)
Lipids , TSH 

## 2015-01-14 ENCOUNTER — Ambulatory Visit (HOSPITAL_BASED_OUTPATIENT_CLINIC_OR_DEPARTMENT_OTHER): Payer: Medicare Other

## 2015-01-14 ENCOUNTER — Other Ambulatory Visit: Payer: Self-pay | Admitting: Oncology

## 2015-01-14 VITALS — BP 139/66 | HR 64 | Temp 97.9°F | Resp 20

## 2015-01-14 DIAGNOSIS — M8580 Other specified disorders of bone density and structure, unspecified site: Secondary | ICD-10-CM

## 2015-01-14 MED ORDER — ZOLEDRONIC ACID 4 MG/100ML IV SOLN
4.0000 mg | Freq: Once | INTRAVENOUS | Status: AC
  Administered 2015-01-14: 4 mg via INTRAVENOUS
  Filled 2015-01-14: qty 100

## 2015-01-14 MED ORDER — ALTEPLASE 2 MG IJ SOLR
2.0000 mg | Freq: Once | INTRAMUSCULAR | Status: DC | PRN
  Filled 2015-01-14: qty 2

## 2015-01-14 MED ORDER — SODIUM CHLORIDE 0.9 % IV SOLN
Freq: Once | INTRAVENOUS | Status: AC
  Administered 2015-01-14: 12:00:00 via INTRAVENOUS

## 2015-01-14 MED ORDER — SODIUM CHLORIDE 0.9 % IJ SOLN
3.0000 mL | Freq: Once | INTRAMUSCULAR | Status: DC | PRN
  Filled 2015-01-14: qty 10

## 2015-01-14 MED ORDER — HEPARIN SOD (PORK) LOCK FLUSH 100 UNIT/ML IV SOLN
500.0000 [IU] | Freq: Once | INTRAVENOUS | Status: DC | PRN
Start: 2015-01-14 — End: 2015-01-14
  Filled 2015-01-14: qty 5

## 2015-01-14 MED ORDER — SODIUM CHLORIDE 0.9 % IJ SOLN
10.0000 mL | INTRAMUSCULAR | Status: DC | PRN
  Filled 2015-01-14: qty 10

## 2015-01-14 MED ORDER — HEPARIN SOD (PORK) LOCK FLUSH 100 UNIT/ML IV SOLN
250.0000 [IU] | Freq: Once | INTRAVENOUS | Status: DC | PRN
  Filled 2015-01-14: qty 5

## 2015-01-14 NOTE — Patient Instructions (Signed)

## 2015-03-05 ENCOUNTER — Other Ambulatory Visit: Payer: Self-pay | Admitting: Internal Medicine

## 2015-03-06 ENCOUNTER — Ambulatory Visit (INDEPENDENT_AMBULATORY_CARE_PROVIDER_SITE_OTHER): Payer: Medicare Other

## 2015-03-06 DIAGNOSIS — Z23 Encounter for immunization: Secondary | ICD-10-CM | POA: Diagnosis not present

## 2015-03-13 ENCOUNTER — Encounter: Payer: Self-pay | Admitting: Vascular Surgery

## 2015-03-17 ENCOUNTER — Other Ambulatory Visit: Payer: Self-pay | Admitting: *Deleted

## 2015-03-17 DIAGNOSIS — I83893 Varicose veins of bilateral lower extremities with other complications: Secondary | ICD-10-CM

## 2015-03-18 ENCOUNTER — Encounter: Payer: Self-pay | Admitting: Vascular Surgery

## 2015-03-18 ENCOUNTER — Ambulatory Visit (INDEPENDENT_AMBULATORY_CARE_PROVIDER_SITE_OTHER): Payer: Medicare Other | Admitting: Vascular Surgery

## 2015-03-18 ENCOUNTER — Ambulatory Visit (HOSPITAL_COMMUNITY)
Admission: RE | Admit: 2015-03-18 | Discharge: 2015-03-18 | Disposition: A | Discharge status: Home / Self Care | Payer: Medicare Other | Source: Ambulatory Visit | Attending: Vascular Surgery | Admitting: Vascular Surgery

## 2015-03-18 VITALS — BP 139/77 | HR 75 | Temp 98.0°F | Resp 16 | Ht <= 58 in | Wt 114.0 lb

## 2015-03-18 DIAGNOSIS — I83893 Varicose veins of bilateral lower extremities with other complications: Secondary | ICD-10-CM

## 2015-03-18 NOTE — Progress Notes (Signed)
Subjective:     Patient ID: Courtney [name], female   DOB: February 17, 1946, 69 y.o.   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 She has a long history of spider veins and small varicosities in both lower extremities and has been treated with saline injections-sclerotherapy in the past by Dr. Midge Minium. She had a good result. She has developed more of these over the past 10 years. She has no history of DVT thrombophlebitis stasis ulcers with bleeding. She does not develop swelling in the ankles. She does not relate to compression stockings. 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 She develops aching throbbing and burning discomfort as the day progresses.  Past Medical History  Diagnosis Date  . GERD (gastroesophageal reflux disease)     S/P dilation X 1  . Reactive airway disease   . Night sweats   . Family history of breast cancer   . Hypertension   . Breast cancer [facility]) July 2012    post double mastectomy; Dr Humphrey Rolls  . Diverticulosis     Social History  Substance Use Topics  . Smoking status: Former Smoker    Quit date: 05/03/1965  . Smokeless tobacco: Never Used     Comment: smoked 1962-1967, up to < 1 ppd  . Alcohol Use: 8.4 oz/week    14 Glasses of wine per week     Comment: 2-3 /day    Family History  Problem Relation Age of Onset  . Breast cancer Mother   . Colon cancer Mother 11  . Heart attack Father     > 64  . Diabetes Father     probable pre DM  . Cancer Sister     appendiceal  . Heart attack Maternal Grandfather     > 55  . Stroke Neg Hx   . Heart attack Paternal Uncle     X2 ; ? age    No Known Allergies   Current outpatient prescriptions:  .  amLODipine (NORVASC) 5 MG tablet, Take 1 tablet (5 mg total) by mouth daily., Disp: 90 tablet, Rfl: 2 .  Ascorbic Acid (VITAMIN C) 1000 MG tablet, Take 1,000 mg by mouth daily., Disp: , Rfl:  .  aspirin 81 MG tablet, Take 81 mg by  mouth daily.  , Disp: , Rfl:  .  Azelaic Acid (FINACEA EX), Apply topically as directed., Disp: , Rfl:  .  azithromycin (ZITHROMAX Z-PAK) 250 MG tablet, 2 day 1, then 1 qd, Disp: 6 tablet, Rfl: 0 .  benzonatate (TESSALON) 200 MG capsule, Take 1 capsule (200 mg total) by mouth every 8 (eight) hours. For cough, Disp: 15 capsule, Rfl: 0 .  calcium citrate-vitamin D (CITRACAL+D) 315-200 MG-UNIT per tablet, Take 1 tablet by mouth daily., Disp: , Rfl:  .  Cholecalciferol (VITAMIN D3) 1000 UNITS CAPS, Take by mouth daily.  , Disp: , Rfl:  .  co-enzyme Q-10 30 MG capsule, Take 50 mg by mouth daily. , Disp: , Rfl:  .  fluticasone (FLONASE) 50 MCG/ACT nasal spray, Place 1 spray into the nose 2 (two) times daily as needed for rhinitis., Disp: 16 g, Rfl: 11 .  gabapentin (NEURONTIN) 300 MG capsule, Take 1 capsule (300 mg total) by mouth at bedtime., Disp: 90 capsule, Rfl: 4 .  Ginseng (ELEUTHERO PO), Take by mouth as directed., Disp: , Rfl:  .  Glucosamine Sulfate 500 MG CAPS, Take 3 capsules by mouth daily.,  Disp: , Rfl:  .  HORSE CHESTNUT PO, Take 250 mg by mouth daily.  , Disp: , Rfl:  .  letrozole (FEMARA) 2.5 MG tablet, Take 1 tablet (2.5 mg total) by mouth daily., Disp: 90 tablet, Rfl: 10 .  losartan-hydrochlorothiazide (HYZAAR) 100-12.5 MG tablet, TAKE 1 TABLET BY MOUTH EVERY DAY, Disp: 90 tablet, Rfl: 2 .  Misc Natural Products (TART CHERRY ADVANCED PO), Take 2,400 mg by mouth daily., Disp: , Rfl:  .  montelukast (SINGULAIR) 10 MG tablet, TAKE 1 TABLET BY MOUTH AT BEDTIME, Disp: 30 tablet, Rfl: 5 .  Multiple Vitamins-Minerals (MULTIVITAMIN WITH MINERALS) tablet, Take 2 tablets by mouth daily.  , Disp: , Rfl:  .  OMEGA 3 1000 MG CAPS, Take 1,000 mg by mouth daily.  , Disp: , Rfl:  .  omeprazole (PRILOSEC) 20 MG capsule, Take 1 capsule (20 mg total) by mouth 2 (two) times daily. --No further refills until office visit is scheduled., Disp: 60 capsule, Rfl: 1 .  psyllium (METAMUCIL) 58.6 % powder, Take 1  packet by mouth daily., Disp: , Rfl:  .  pyridOXINE (VITAMIN B-6) 50 MG tablet, Take 50 mg by mouth daily., Disp: , Rfl:  .  SYMBICORT 160-4.5 MCG/ACT inhaler, INHALE 1 OR 2 PUFFS EVERY 12 HOURS. GARGLE AND SPIT AFTER EACH USE., Disp: 10.2 g, Rfl: 5 .  tretinoin (RETIN-A) 0.025 % cream, Apply topically at bedtime., Disp: , Rfl:   Filed Vitals:   03/18/15 1406  BP: 139/77  Pulse: 75  Temp: 98 F (36.7 C)  Resp: 16  Height: 4\' 10"  (1.473 m)  Weight: 114 lb (51.71 kg)  SpO2: 98%    Body mass index is 23.83 kg/(m^2).            Review of Systems denies chest pain, dyspnea on exertion, PND, orthopnea, hemoptysis, claudication. Has history of bilateral mastectomy for breast cancer. Other systems negative in a [place] review of systems     Objective:   Physical Exam BP 139/77 mmHg  Pulse 75  Temp(Src) 98 F (36.7 C)  Resp 16  Ht 4\' 10"  (1.473 m)  Wt 114 lb (51.71 kg)  BMI 23.83 kg/m2  SpO2 98%  Gen.-alert and oriented x3 in no apparent distress HEENT normal for age Lungs no rhonchi or wheezing Cardiovascular regular rhythm no murmurs carotid pulses 3+ palpable no bruits audible Abdomen soft nontender no palpable masses Musculoskeletal free of  major deformities Skin clear -no rashes Neurologic normal Lower extremities 3+ femoral and dorsalis pedis pulses palpable bilaterally with no edema Both legs have diffuse spider veins in the anterior thigh and below the knee in the pretibial and posterior calf areas. There is a small varicosity in the right distal thigh of the great saphenous vein measuring about 1 cm in diameter and maximum dimension. No hyperpigmentation or ulceration is noted.  Today I ordered bilateral venous duplex exam which I reviewed and interpreted. There is no DVT. There is very little reflux in the great saphenous veins bilaterally in the small saphenous veins bilaterally with very small caliber veins.      Assessment:     Bilateral small  varicosities and spider veins which are symptomatic with no significant reflux in the great saphenous veins    Plan:     Best treatment would be foam sclerotherapy. We will discuss this with patient and she will decide if she would like to proceed.

## 2015-03-22 ENCOUNTER — Other Ambulatory Visit: Payer: Self-pay | Admitting: Internal Medicine

## 2015-03-30 ENCOUNTER — Other Ambulatory Visit: Payer: Self-pay | Admitting: Internal Medicine

## 2015-03-30 ENCOUNTER — Other Ambulatory Visit: Payer: Self-pay | Admitting: Oncology

## 2015-03-31 NOTE — Telephone Encounter (Signed)
Chart reviewed.

## 2015-04-30 ENCOUNTER — Telehealth: Payer: Self-pay | Admitting: *Deleted

## 2015-04-30 DIAGNOSIS — J0101 Acute recurrent maxillary sinusitis: Secondary | ICD-10-CM

## 2015-04-30 MED ORDER — FLUTICASONE PROPIONATE 50 MCG/ACT NA SUSP: 1.0000 | Freq: Every day | NASAL | Status: DC

## 2015-04-30 NOTE — Telephone Encounter (Signed)
Let msg on triage wanting to get refill on her flucatisone nasal spray. Called pt no answer can't leave msg on vm due to vm is full. Sent refill to [facility]...Johny Chess

## 2015-05-09 ENCOUNTER — Encounter: Payer: Self-pay | Admitting: *Deleted

## 2015-05-14 ENCOUNTER — Ambulatory Visit (INDEPENDENT_AMBULATORY_CARE_PROVIDER_SITE_OTHER): Payer: Medicare Other | Admitting: *Deleted

## 2015-05-14 DIAGNOSIS — I8393 Asymptomatic varicose veins of bilateral lower extremities: Secondary | ICD-10-CM

## 2015-05-14 DIAGNOSIS — I83893 Varicose veins of bilateral lower extremities with other complications: Secondary | ICD-10-CM

## 2015-05-14 NOTE — Progress Notes (Signed)
X=.5% -6 cc and .3% -12cc Sotradecol administered with a 27g butterfly.  Patient received a total of 18cc.  Treated majority of her vv's, reticulars, and spider veins, but unable to get all. Easy access. Tol very well. Anticipate good results. Follow prn.  Photos: Yes.    Compression stockings applied: Yes.

## 2015-05-20 ENCOUNTER — Encounter: Payer: Self-pay | Admitting: Podiatry

## 2015-05-20 ENCOUNTER — Ambulatory Visit (INDEPENDENT_AMBULATORY_CARE_PROVIDER_SITE_OTHER): Payer: Medicare Other | Admitting: Podiatry

## 2015-05-20 VITALS — BP 124/56 | HR 67 | Resp 16

## 2015-05-20 DIAGNOSIS — L600 Ingrowing nail: Secondary | ICD-10-CM | POA: Diagnosis not present

## 2015-05-20 MED ORDER — NEOMYCIN-POLYMYXIN-HC 1 % OT SOLN: OTIC | Status: DC

## 2015-05-20 NOTE — Patient Instructions (Signed)

## 2015-05-20 NOTE — Progress Notes (Signed)
   Subjective:    Patient ID: Courtney [name], female    DOB: 1945/05/26, 70 y.o.   MRN: [id number]  HPI: She presents today with a 40 year duration of a painful irritating ingrown toenail to the tibial and fibular border of the right hallux. She states that her husband stepped on her toe while at [place] in [place]. She states since that time the toe is always been painful.    Review of Systems  All other systems reviewed and are negative.      Objective:   Physical Exam: 70 year old female vital signs stable alert and oriented 3 no apparent distress pulses are strongly palpable digital hair is noted. Capillary fill time is immediate. Currently wearing compression stockings secondary to sclerotherapy. Neurologic sensorium is intact per Semmes-Weinstein monofilament. Deep tendon reflexes are intact. Muscle strength +5 over 5 dorsiflexion plantar flexors and inverters and everters all intrinsic musculature is intact. Orthopedic evaluation demonstrates all joints distal to the ankle range of motion without crepitation. She does have some tenderness on palpation of the fifth metatarsal of the left foot. No overlying edema or tendon pain. Cutaneous evaluation demonstrates supple well-hydrated cutis no erythema with exception along the tibial border and fibular border of the hallux right with sharp incurvated nail margins. No purulence and no malodor.        Assessment & Plan:  Ingrown nail hallux right.  Plan: We discussed the etiology pathology conservative versus surgical therapies. At this point we performed a chemical matrixectomy to the tibial and fibular border of the hallux right nail plate. She tolerated this procedure well after local anesthesia was administered. I then wrote a prescription for Cortisporin otic to be applied twice daily after soaking. She was provided with both oral and written home-going instructions for the care and soaking of her toe. I will follow-up with her  in 1 week.

## 2015-05-29 ENCOUNTER — Encounter (INDEPENDENT_AMBULATORY_CARE_PROVIDER_SITE_OTHER): Payer: Medicare Other

## 2015-05-29 ENCOUNTER — Encounter: Payer: Self-pay | Admitting: Podiatry

## 2015-05-29 ENCOUNTER — Ambulatory Visit (INDEPENDENT_AMBULATORY_CARE_PROVIDER_SITE_OTHER): Payer: Medicare Other | Admitting: Podiatry

## 2015-05-29 DIAGNOSIS — L600 Ingrowing nail: Secondary | ICD-10-CM

## 2015-05-29 DIAGNOSIS — I83893 Varicose veins of bilateral lower extremities with other complications: Secondary | ICD-10-CM

## 2015-05-29 NOTE — Progress Notes (Signed)
She presents today follow-up matrixectomy hallux right. She states that she continues to soak in Betadine and water and apply Cortisporin otic as directed. She states that really haven't had any pain and is feeling much better.  Objective: Vital signs are stable alert and oriented 3. No erythema edema saline as drainage or odor. The margins appear to be granulating in and epithelialization is occurring.  Assessment: Wound surgical to hallux right tibial and fibular border status post matrixectomy.  Plan: Discontinue Betadine soaked with Epsom salts and warm water soaks. Continue second to completely resolve. Continue the application of Cortisporin Otic twice daily covered in the daytime and leave open at bedtime.

## 2015-05-29 NOTE — Patient Instructions (Signed)

## 2015-06-09 ENCOUNTER — Encounter: Payer: Self-pay | Admitting: *Deleted

## 2015-06-10 ENCOUNTER — Ambulatory Visit: Payer: Medicare Other | Admitting: *Deleted

## 2015-06-10 DIAGNOSIS — I781 Nevus, non-neoplastic: Secondary | ICD-10-CM

## 2015-06-10 NOTE — Progress Notes (Signed)
Pt asked to come in to have me look at her progress since her sclerotherapy treatment. All injected sites healing as expected. Anticipate good results. Follow prn.

## 2015-06-16 ENCOUNTER — Ambulatory Visit (INDEPENDENT_AMBULATORY_CARE_PROVIDER_SITE_OTHER): Payer: Medicare Other | Admitting: Family Medicine

## 2015-06-16 ENCOUNTER — Encounter: Payer: Self-pay | Admitting: Family Medicine

## 2015-06-16 VITALS — BP 120/88 | Temp 98.6°F | Wt 114.0 lb

## 2015-06-16 DIAGNOSIS — J069 Acute upper respiratory infection, unspecified: Secondary | ICD-10-CM

## 2015-06-16 DIAGNOSIS — J45901 Unspecified asthma with (acute) exacerbation: Secondary | ICD-10-CM

## 2015-06-16 DIAGNOSIS — B9789 Other viral agents as the cause of diseases classified elsewhere: Principal | ICD-10-CM

## 2015-06-16 DIAGNOSIS — J4521 Mild intermittent asthma with (acute) exacerbation: Secondary | ICD-10-CM | POA: Diagnosis not present

## 2015-06-16 DIAGNOSIS — J441 Chronic obstructive pulmonary disease with (acute) exacerbation: Secondary | ICD-10-CM | POA: Insufficient documentation

## 2015-06-16 MED ORDER — DOXYCYCLINE HYCLATE 100 MG PO TABS: 100.0000 mg | ORAL_TABLET | Freq: Two times a day (BID) | ORAL | Status: DC

## 2015-06-16 MED ORDER — HYDROCODONE-HOMATROPINE 5-1.5 MG/5ML PO SYRP: 5.0000 mL | ORAL_SOLUTION | Freq: Three times a day (TID) | ORAL | Status: DC | PRN

## 2015-06-16 NOTE — Progress Notes (Signed)
   Subjective:    Patient ID: Courtney [name], female    DOB: 09/20/1945, 70 y.o.   MRN: [id number]  HPI Courtney [name] is a 70 year old married female nonsmoker who comes in today for evaluation of a cough for 4 weeks  She states about 4 weeks ago she developed a cold with head congestion runny nose and cough. The cough has gotten worse over the past month and will go away. She's been on Symbicort 2 puffs every 12 hours which she's taken in the past intermittently for wheezing. She says she had an episode in the fall last about 2 weeks and went away without antibiotics. She did use her Symbicort at that juncture 2. She's also on Flonase for allergic rhinitis and Singulair.  She states when she was younger she was sensitive to perfumes. She recalls [place] roller [place] and developing some wheezing. She's never had an allergy workup. She's never had pulmonary function studies.  She's bringing of some discolored sputum. She's been on prednisone 40 mg daily for the past 2 days with some relief.  She does have a dog at home but no cats   Review of Systems Review of systems otherwise negative last physical exam fall 2016 Dr. Linna Darner    Objective:   Physical Exam  Well-developed well-nourished female no acute distress vital signs stable she's afebrile HEENT were negative neck was supple no adenopathy thyroid normal pulmonary exam shows mild inspiratory and expiratory wheezing with forced expiration no crackles      Assessment & Plan:  Asthma 4 weeks triggered by viral infection......... continue Symbicort.........Marland Kitchen prednisone burst and taper.... Add doxycycline..... Hydromet cough syrup........ consider pulmonary evaluation with PFTs and 2 months

## 2015-06-16 NOTE — Progress Notes (Signed)
Pre visit review using our clinic review tool, if applicable. No additional management support is needed unless otherwise documented below in the visit note. 

## 2015-06-16 NOTE — Patient Instructions (Signed)
Doxycycline 100 mg......... one twice daily for 10 days...Marland KitchenMarland KitchenMarland Kitchen refill 1  Hydromet..... 1/2-1 teaspoon 3 times daily when necessary for cough  Drink lots of water  Symbicort.......... 2 puffs twice daily........Marland Kitchen remember to swish and spit after each use  Prednisone 20 mg starting tomorrow take 1 tab for 5 days, half a tab for 5 days, and then a half a tab Monday Wednesday Friday for a two-week taper  Return in 4 to 6  weeks for follow-up.... Sooner if any problems

## 2015-07-15 ENCOUNTER — Other Ambulatory Visit: Payer: Self-pay | Admitting: Internal Medicine

## 2015-07-15 DIAGNOSIS — R05 Cough: Secondary | ICD-10-CM

## 2015-07-15 DIAGNOSIS — J4520 Mild intermittent asthma, uncomplicated: Secondary | ICD-10-CM

## 2015-07-15 DIAGNOSIS — R059 Cough, unspecified: Secondary | ICD-10-CM

## 2015-07-15 MED ORDER — BUDESONIDE-FORMOTEROL FUMARATE 160-4.5 MCG/ACT IN AERO: INHALATION_SPRAY | RESPIRATORY_TRACT | Status: DC

## 2015-07-24 ENCOUNTER — Ambulatory Visit (INDEPENDENT_AMBULATORY_CARE_PROVIDER_SITE_OTHER): Payer: Medicare Other | Admitting: Internal Medicine

## 2015-07-24 ENCOUNTER — Encounter: Payer: Self-pay | Admitting: Family Medicine

## 2015-07-24 ENCOUNTER — Ambulatory Visit (INDEPENDENT_AMBULATORY_CARE_PROVIDER_SITE_OTHER): Payer: Medicare Other | Admitting: Family Medicine

## 2015-07-24 ENCOUNTER — Encounter: Payer: Self-pay | Admitting: Internal Medicine

## 2015-07-24 ENCOUNTER — Ambulatory Visit (INDEPENDENT_AMBULATORY_CARE_PROVIDER_SITE_OTHER)
Admission: RE | Admit: 2015-07-24 | Discharge: 2015-07-24 | Disposition: A | Discharge status: Home / Self Care | Payer: Medicare Other | Source: Ambulatory Visit | Attending: Internal Medicine | Admitting: Internal Medicine

## 2015-07-24 VITALS — BP 122/70 | HR 77 | Ht <= 58 in | Wt 116.6 lb

## 2015-07-24 VITALS — BP 120/70 | HR 74 | Temp 98.5°F | Wt 115.0 lb

## 2015-07-24 DIAGNOSIS — J45901 Unspecified asthma with (acute) exacerbation: Secondary | ICD-10-CM

## 2015-07-24 DIAGNOSIS — J441 Chronic obstructive pulmonary disease with (acute) exacerbation: Secondary | ICD-10-CM

## 2015-07-24 DIAGNOSIS — J4531 Mild persistent asthma with (acute) exacerbation: Secondary | ICD-10-CM

## 2015-07-24 DIAGNOSIS — K219 Gastro-esophageal reflux disease without esophagitis: Secondary | ICD-10-CM | POA: Diagnosis not present

## 2015-07-24 DIAGNOSIS — I83893 Varicose veins of bilateral lower extremities with other complications: Secondary | ICD-10-CM

## 2015-07-24 MED ORDER — PREDNISONE 20 MG PO TABS: ORAL_TABLET | ORAL | Status: DC

## 2015-07-24 MED ORDER — AMOXICILLIN 875 MG PO TABS: 875.0000 mg | ORAL_TABLET | Freq: Two times a day (BID) | ORAL | Status: DC

## 2015-07-24 NOTE — Patient Instructions (Signed)
Finish the prednisone taper and amoxacillin as directed  Continue Symbicort 2 puffs then rinse mouth well, twice daily  Ok to use the rescue inhaler 2 puffs every 4- 6 hours if needed  Order- CXR    Dx asthmatic bronchitis acute exacerbation  Order schedule PFT    Not sooner than 2 weeks  Schedule follow up with one of our providers in 4-6 weeks- sooner if needed

## 2015-07-24 NOTE — Progress Notes (Signed)
07/24/2015-70 year old female former smoker referred courtesy of Dr.Todd for questionable asthma. pt c/o prod cough light brown in color, wheezing, chest tightness, occ SOB X16mo History bilateral  breast cancer without XRT, GERD Describes a viral pattern bronchitis beginning mid January with persistent cough after that. Improved initially with antibiotics and prednisone but had some residual cough with clear mucus beads. Pattern continued for several weeks. One week ago went to [employer] pool to watch a family member's when. Began coughing again. She has been swimming regularly at her own pool and has return for another visit to the [facility] pool without acute worsening. Because of worse cough she restarted a prednisone taper using 20 mg tablets 2 days ago. Today Dr. Sherren Mocha told her to take 40 mg daily for 3 more days then taper off. She was using Symbicort just 1 puff once daily. Aware of dyspnea on exertion. Sputum is currently light brown without blood. Denies fever, adenopathy, rash. Reflux is controlled, sleeping on a wedge plus pillow and taking PPI.  Prior to Admission medications   Medication Sig Start Date End Date Taking? Authorizing Provider  amLODipine (NORVASC) 5 MG tablet Take 1 tablet (5 mg total) by mouth daily. 03/05/15  Yes Hendricks Limes, MD  amoxicillin (AMOXIL) 875 MG tablet Take 1 tablet (875 mg total) by mouth 2 (two) times daily. 07/24/15  Yes Dorena Cookey, MD  Ascorbic Acid (VITAMIN C) 1000 MG tablet Take 1,000 mg by mouth daily.   Yes Historical Provider, MD  aspirin 81 MG tablet Take 81 mg by mouth daily.     Yes Historical Provider, MD  Azelaic Acid (FINACEA EX) Apply topically as directed.   Yes Historical Provider, MD  budesonide-formoterol (SYMBICORT) 160-4.5 MCG/ACT inhaler INHALE 1 OR 2 PUFFS EVERY 12 HOURS. GARGLE AND SPIT AFTER EACH USE. 07/15/15  Yes Hendricks Limes, MD  calcium citrate-vitamin D (CITRACAL+D) 315-200 MG-UNIT per tablet  Take 1 tablet by mouth daily.   Yes Historical Provider, MD  Cholecalciferol (VITAMIN D3) 1000 UNITS CAPS Take by mouth daily.     Yes Historical Provider, MD  clobetasol (TEMOVATE) 0.05 % external solution APPLY TO SKIN D 05/08/15  Yes Historical Provider, MD  co-enzyme Q-10 30 MG capsule Take 50 mg by mouth daily.    Yes Historical Provider, MD  fluticasone (FLONASE) 50 MCG/ACT nasal spray Place 1 spray into both nostrils daily. 04/30/15  Yes Hendricks Limes, MD  gabapentin (NEURONTIN) 300 MG capsule Take 1 capsule (300 mg total) by mouth at bedtime. 12/30/14  Yes Chauncey Cruel, MD  Ginseng (ELEUTHERO PO) Take by mouth as directed.   Yes Historical Provider, MD  Glucosamine Sulfate 500 MG CAPS Take 3 capsules by mouth daily.   Yes Historical Provider, MD  HORSE CHESTNUT PO Take 250 mg by mouth daily.     Yes Historical Provider, MD  HYDROcodone-homatropine (HYCODAN) 5-1.5 MG/5ML syrup Take 5 mLs by mouth every 8 (eight) hours as needed. 06/16/15  Yes Dorena Cookey, MD  letrozole [facility]) 2.5 MG tablet Take 1 tablet (2.5 mg total) by mouth daily. 12/30/14  Yes Chauncey Cruel, MD  losartan-hydrochlorothiazide (HYZAAR) 100-12.5 MG tablet TAKE 1 TABLET BY MOUTH EVERY DAY 03/05/15  Yes Hendricks Limes, MD  Misc Natural Products (TART CHERRY ADVANCED PO) Take 2,400 mg by mouth daily.   Yes Historical Provider, MD  montelukast (SINGULAIR) 10 MG tablet TAKE 1 TABLET BY MOUTH AT BEDTIME 03/24/15  Yes Hendricks Limes, MD  Multiple Vitamins-Minerals (MULTIVITAMIN WITH MINERALS) tablet Take 2 tablets by mouth daily.     Yes Historical Provider, MD  OMEGA 3 1000 MG CAPS Take 1,000 mg by mouth daily.     Yes Historical Provider, MD  omeprazole (PRILOSEC) 20 MG capsule Take 1 capsule (20 mg total) by mouth 2 (two) times daily before a meal. 03/31/15  Yes Hendricks Limes, MD  predniSONE (DELTASONE) 20 MG tablet 2 tabs x 3 days, 1 tab x 3 days, 1/2 tab x 3 days, 1/2 tab M,W,F x 2 weeks 07/24/15  Yes Dorena Cookey, MD  psyllium (METAMUCIL) 58.6 % powder Take 1 packet by mouth daily.   Yes Historical Provider, MD  pyridOXINE (VITAMIN B-6) 50 MG tablet Take 50 mg by mouth daily.   Yes Historical Provider, MD  tretinoin (RETIN-A) 0.025 % cream Apply topically at bedtime.   Yes Historical Provider, MD   Past Medical History  Diagnosis Date  . GERD (gastroesophageal reflux disease)     S/P dilation X 1  . Reactive airway disease   . Night sweats   . Family history of breast cancer   . Hypertension   . Breast cancer [facility]) July 2012    post double mastectomy; Dr Humphrey Rolls  . Diverticulosis    Past Surgical History  Procedure Laterality Date  . Nasal septum surgery    . Thyroglossal duct cyst    . Abdominal hysterectomy    . Breast surgery  01/13/11    bilateral total mastectomy; Dr Dalbert Batman  . Bilateral breast reconstruction  Jan 2013  . Reconstruction / correction of nipple / aerola  June 2013  . Colonoscopy  2014    Dr Olevia Perches; hyperplastic polyp  . Esophageal dilation  2014   Family History  Problem Relation Age of Onset  . Breast cancer Mother   . Colon cancer Mother 51  . Heart attack Father     > 2  . Diabetes Father     probable pre DM  . Cancer Sister     appendiceal  . Heart attack Maternal Grandfather     > 55  . Stroke Neg Hx   . Heart attack Paternal Uncle     X2 ; ? age   Social History   Social History  . Marital Status: Married    Spouse Name: N/A  . Number of Children: N/A  . Years of Education: N/A   Occupational History  . Not on file.   Social History Main Topics  . Smoking status: Former Smoker    Quit date: 05/03/1965  . Smokeless tobacco: Never Used     Comment: smoked 1962-1967, up to < 1 ppd  . Alcohol Use: 8.4 oz/week    14 Glasses of wine per week     Comment: 2-3 /day  . Drug Use: No  . Sexual Activity: Yes   Other Topics Concern  . Not on file   Social History Narrative   ROS-see HPI   Negative unless "+" Constitutional:    weight  loss, night sweats, fevers, chills, fatigue, lassitude. HEENT:    headaches, difficulty swallowing, tooth/dental problems, sore throat,       sneezing, itching, ear ache, nasal congestion, post nasal drip, snoring CV:    chest pain, orthopnea, PND, swelling in lower extremities, anasarca,  dizziness, palpitations Resp:   shortness of breath with exertion or at rest.                productive cough,   non-productive cough, coughing up of blood.              change in color of mucus.  wheezing.   Skin:    rash or lesions. GI:  No-   heartburn, indigestion, abdominal pain, nausea, vomiting, diarrhea,                 change in bowel habits, loss of appetite GU: dysuria, change in color of urine, no urgency or frequency.   flank pain. MS:   joint pain, stiffness, decreased range of motion, back pain. Neuro-     nothing unusual Psych:  change in mood or affect.  depression or anxiety.   memory loss.  OBJ- Physical Exam General- Alert, Oriented, Affect-appropriate, Distress- none acute Skin- rash-none, lesions- none, excoriation- none Lymphadenopathy- none Head- atraumatic            Eyes- Gross vision intact, PERRLA, conjunctivae and secretions clear            Ears- Hearing, canals-normal            Nose- Clear, no-Septal dev, mucus, polyps, erosion, perforation             Throat- Mallampati II , mucosa clear , drainage- none, tonsils- atrophic Neck- flexible , trachea midline, no stridor , thyroid nl, carotid no bruit Chest - symmetrical excursion , unlabored           Heart/CV- RRR , no murmur , no gallop  , no rub, nl s1 s2                           - JVD- none , edema- none, stasis changes- none, varices- none           Lung- + coarse breath sounds left base otherwise clear and unlabored, wheeze- none, cough- none , dullness-none, rub- none           Chest wall- + bilateral mastectomy Abd-  Br/ Gen/ Rectal- Not done, not  indicated Extrem- cyanosis- none, clubbing, none, atrophy- none, strength- nl Neuro- grossly intact to observation

## 2015-07-24 NOTE — Progress Notes (Signed)
Pre visit review using our clinic review tool, if applicable. No additional management support is needed unless otherwise documented below in the visit note. 

## 2015-07-24 NOTE — Patient Instructions (Signed)
Prednisone 20 mg........... 2 tabs daily then taper as outlined  Amoxicillin.......... one twice daily for 10 days.....Marland Kitchen if you develop any diarrhea or loose bowel movements stop it immediately  Albuterol and Symbicort............ 2 puffs of each twice daily  Drink lots of water  Return on Tuesday for follow-up as outlined

## 2015-07-24 NOTE — Progress Notes (Signed)
   Subjective:    Patient ID: Courtney [name], female    DOB: 10-03-1945, 70 y.o.   MRN: [id number]  HPI Courtney [name] is a 70 year old married female nonsmoker who comes in today for follow-up of asthma  We saw her here a couple weeks ago. At that time she had a viral syndrome that sugar to her cough. Her wheezing was very minimal but her cough was very intense. Felt like she most likely had reactive airway disease. We treated empirically with prednisone 40 mg daily for 5 days with a taper, empirically gave her doxycycline twice a day for 10 days although there is no evidence of any infection. She takes Symbicort and albuterol 2 puffs twice a day when she is having a severe spell. She typically only takes Symbicort in the winter when she has a problem and then stops it in the spring summer and fall. Her last chest x-ray 2013 was normal.  She states after her first round of prednisone she was 90% better  Again historically this started when she was a teenager. 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 She did have a history of dust allergy as a teenager. But historically these symptoms always seem to be triggered by a viral infection.  At one point she had a septal deviation which was repaired. Prior that she had an episode of sinusitis.  Review of Systems Review of systems otherwise negative no fever earache sore throat shortness of breath etc. She is able to sleep at night without coughing. No history of reflux    Objective:   Physical Exam  Well-developed well-nourished female no acute distress vital signs stable she's afebrile HEENT were negative neck was supple no adenopathy lungs are clear      Assessment & Plan:  Reactive airway disease...........Marland Kitchen prednisone 40 mg daily for 5 days then taper slowly,,,,,,,,, amoxicillin 875 twice a day for 10 days,,,,,,,,, Symbicort and albuterol,,,,,, 2 puffs of each twice daily,,,,,,,,,, return next Tuesday for follow-up

## 2015-07-25 ENCOUNTER — Telehealth: Payer: Self-pay | Admitting: Internal Medicine

## 2015-07-25 DIAGNOSIS — K219 Gastro-esophageal reflux disease without esophagitis: Secondary | ICD-10-CM | POA: Insufficient documentation

## 2015-07-25 NOTE — Telephone Encounter (Signed)
Pt is aware of results. 

## 2015-07-25 NOTE — Assessment & Plan Note (Signed)
Clinically this seems like a viral pattern bronchitis with a second exacerbation possibly related to exposure to indoor swimming pool chemicals or unrecognized second viral infection. She is on appropriate medications. We need to see that she clears. Plan-chest x-ray, spirometry or PFT. Continue Symbicort 2 puffs twice a day and use rescue inhaler every 4-6 hours if needed.

## 2015-07-25 NOTE — Assessment & Plan Note (Signed)
Plan-reminded reflux precautions

## 2015-07-25 NOTE — Assessment & Plan Note (Signed)
She does not have ankle edema at this exam and is not describing swelling or pain in calves. I currently do not suspect DVT/PE associated with acute symptoms at this visit.

## 2015-07-29 ENCOUNTER — Encounter: Payer: Self-pay | Admitting: Family Medicine

## 2015-07-29 ENCOUNTER — Ambulatory Visit (INDEPENDENT_AMBULATORY_CARE_PROVIDER_SITE_OTHER): Payer: Medicare Other | Admitting: Family Medicine

## 2015-07-29 VITALS — BP 104/68 | HR 80 | Temp 98.6°F | Wt 113.0 lb

## 2015-07-29 DIAGNOSIS — J441 Chronic obstructive pulmonary disease with (acute) exacerbation: Secondary | ICD-10-CM

## 2015-07-29 DIAGNOSIS — I517 Cardiomegaly: Secondary | ICD-10-CM

## 2015-07-29 DIAGNOSIS — J45901 Unspecified asthma with (acute) exacerbation: Principal | ICD-10-CM

## 2015-07-29 NOTE — Patient Instructions (Addendum)
Prednisone 20 mg 1 daily for 5 days, half a tab for 5 days, then a half a tab Monday Wednesday Friday for a two-week taper  We will set you up a cardiac consult for further evaluation............ however I don't think that's related to anything bad  Set up an appointment for the first week in September for your general physical examination

## 2015-07-29 NOTE — Progress Notes (Signed)
   Subjective:    Patient ID: Courtney [name], female    DOB: 26-Apr-1946, 70 y.o.   MRN: [id number]  HPI Bethena Roys is a 70 year old married female nonsmoker who comes in today for follow-up of asthma  We saw her last week with a recurrence of her asthma. We gave her a round of prednisone and doxycycline she improved about 90% but then it flared. We started on prednisone 40 mg daily and amoxicillin 875 mg twice a day. Dr. Annamaria Boots was kind enough to see her for pulmonary consult and felt like her treatment was appropriate. They did a chest x-ray which showed a question related left basilar infiltrate or atelectasis also the radiology noted that she had cardiomegaly with normal pulmonary vascular. Her last chest x-ray was done 2013 before she had her breast cancer surgery. That chest x-ray was reported to be normal.  Family history,,,,,, no family history of congestive heart failure father had coronary artery disease.  Today she comes in today for follow-up saying she feels better. Her sputum production is almost completely gone. Yesterday she developed a loose bowel movement therefore stopped the amoxicillin appropriately. She started 20 mg of prednisone yesterday.  She's due for post further allergy evaluation by Dr. Annamaria Boots in the future. She had a recent eye exam was told her pressures are up because the prednisone and the inhaled steroid   Review of Systems    review of systems otherwise negative Objective:   Physical Exam Well-developed well-nourished female no acute distress by vital signs stable she's afebrile  Poorly exam normal I can appreciate no crackles. She does have some mild wheezing symmetrically on forced expiration she does have a small chance so it may be that her heart is normal but it's in a small chest therefore it appears to be cardiomegaly. We'll get a EKG to be sure       Assessment & Plan:  Asthma resolving,,,,,,,,,,,, taper prednisone slowly  Chest x-ray reading of  cardiomegaly or with no increase in pulmonary vasculature,,,,,,,

## 2015-07-29 NOTE — Progress Notes (Signed)
Pre visit review using our clinic review tool, if applicable. No additional management support is needed unless otherwise documented below in the visit note. 

## 2015-08-07 ENCOUNTER — Institutional Professional Consult (permissible substitution): Payer: Medicare Other | Admitting: Internal Medicine

## 2015-08-25 ENCOUNTER — Ambulatory Visit (INDEPENDENT_AMBULATORY_CARE_PROVIDER_SITE_OTHER): Payer: Medicare Other | Admitting: Cardiovascular Disease

## 2015-08-25 ENCOUNTER — Encounter: Payer: Self-pay | Admitting: Cardiovascular Disease

## 2015-08-25 VITALS — BP 130/70 | HR 70 | Ht <= 58 in | Wt 113.4 lb

## 2015-08-25 DIAGNOSIS — I517 Cardiomegaly: Secondary | ICD-10-CM | POA: Diagnosis not present

## 2015-08-25 NOTE — Progress Notes (Signed)
Cardiology Office Note   Date:  08/25/2015   ID:  Courtney [name], DOB 29-Dec-1945, MRN [id number]  PCP:  Joycelyn Man, MD  Cardiologist:   Thayer Headings, MD   Chief Complaint  Patient presents with  . Shortness of Breath   Problem List 1. Ashtma 2 Cardiogeglay  3. Essential HTN 4. Breast cancer - 2011   History of Present Illness: Courtney [name] is a 70 y.o. female who presents for further evaluation of cardiomegaly that was noticed on CXR. She has asthma / reactive airways disease  Able to do all of her normal activities     Past Medical History  Diagnosis Date  . GERD (gastroesophageal reflux disease)     S/P dilation X 1  . Reactive airway disease   . Night sweats   . Family history of breast cancer   . Hypertension   . Breast cancer [facility]) July 2012    post double mastectomy; Dr Humphrey Rolls  . Diverticulosis     Past Surgical History  Procedure Laterality Date  . Nasal septum surgery    . Thyroglossal duct cyst    . Abdominal hysterectomy    . Breast surgery  01/13/11    bilateral total mastectomy; Dr Dalbert Batman  . Bilateral breast reconstruction  Jan 2013  . Reconstruction / correction of nipple / aerola  June 2013  . Colonoscopy  2014    Dr Olevia Perches; hyperplastic polyp  . Esophageal dilation  2014     Current Outpatient Prescriptions  Medication Sig Dispense Refill  . amLODipine (NORVASC) 5 MG tablet Take 1 tablet (5 mg total) by mouth daily. 90 tablet 2  . Ascorbic Acid (VITAMIN C) 1000 MG tablet Take 1,000 mg by mouth daily.    Marland Kitchen aspirin 81 MG tablet Take 81 mg by mouth daily.      . Azelaic Acid (FINACEA EX) Apply topically as directed.    . budesonide-formoterol (SYMBICORT) 160-4.5 MCG/ACT inhaler INHALE 1 OR 2 PUFFS EVERY 12 HOURS. GARGLE AND SPIT AFTER EACH USE. 10.2 g 5  . calcium citrate-vitamin D (CITRACAL+D) 315-200 MG-UNIT per tablet Take 1 tablet by mouth daily.    . Cholecalciferol (VITAMIN D3) 1000 UNITS CAPS Take by mouth daily.      .  clobetasol (TEMOVATE) 0.05 % external solution APPLY TO SKIN D  2  . co-enzyme Q-10 30 MG capsule Take 50 mg by mouth daily.     . fluticasone (FLONASE) 50 MCG/ACT nasal spray Place 1 spray into both nostrils daily. 16 g 9  . gabapentin (NEURONTIN) 300 MG capsule Take 1 capsule (300 mg total) by mouth at bedtime. 90 capsule 4  . Ginseng (ELEUTHERO PO) Take by mouth as directed.    . Glucosamine Sulfate 500 MG CAPS Take 3 capsules by mouth daily.    Marland Kitchen HORSE CHESTNUT PO Take 250 mg by mouth daily.      Marland Kitchen letrozole (FEMARA) 2.5 MG tablet Take 1 tablet (2.5 mg total) by mouth daily. 90 tablet 10  . losartan-hydrochlorothiazide (HYZAAR) 100-12.5 MG tablet TAKE 1 TABLET BY MOUTH EVERY DAY 90 tablet 2  . Misc Natural Products (TART CHERRY ADVANCED PO) Take 2,400 mg by mouth daily.    . montelukast (SINGULAIR) 10 MG tablet TAKE 1 TABLET BY MOUTH AT BEDTIME 30 tablet 11  . Multiple Vitamins-Minerals (MULTIVITAMIN WITH MINERALS) tablet Take 2 tablets by mouth daily.      . OMEGA 3 1000 MG CAPS Take 1,000 mg by mouth daily.      Marland Kitchen  omeprazole (PRILOSEC) 20 MG capsule Take 1 capsule (20 mg total) by mouth 2 (two) times daily before a meal. 180 capsule 2  . predniSONE (DELTASONE) 20 MG tablet 2 tabs x 3 days, 1 tab x 3 days, 1/2 tab x 3 days, 1/2 tab M,W,F x 2 weeks 40 tablet 1  . psyllium (METAMUCIL) 58.6 % powder Take 1 packet by mouth daily.    Marland Kitchen pyridOXINE (VITAMIN B-6) 50 MG tablet Take 50 mg by mouth daily.    Marland Kitchen tretinoin (RETIN-A) 0.025 % cream Apply topically at bedtime.     No current facility-administered medications for this visit.    Allergies:   Review of patient's allergies indicates no known allergies.    Social History:  The patient  reports that she quit smoking about 50 years ago. She has never used smokeless tobacco. She reports that she drinks about 8.4 oz of alcohol per week. She reports that she does not use illicit drugs.   Family History:  The patient's family history includes  Breast cancer in her mother; Cancer in her sister; Colon cancer (age of onset: 86) in her mother; Diabetes in her father; Heart attack in her father, maternal grandfather, and paternal uncle. There is no history of Stroke.    ROS:  Please see the history of present illness.    Review of Systems: Constitutional:  denies fever, chills, diaphoresis, appetite change and fatigue.  HEENT: denies photophobia, eye pain, redness, hearing loss, ear pain, congestion, sore throat, rhinorrhea, sneezing, neck pain, neck stiffness and tinnitus.  Respiratory: admits to SOB, DOE, cough, chest tightness, and wheezing.  Cardiovascular: denies chest pain, palpitations and leg swelling.  Gastrointestinal: denies nausea, vomiting, abdominal pain, diarrhea, constipation, blood in stool.  Genitourinary: denies dysuria, urgency, frequency, hematuria, flank pain and difficulty urinating.  Musculoskeletal: denies  myalgias, back pain, joint swelling, arthralgias and gait problem.   Skin: denies pallor, rash and wound.  Neurological: denies dizziness, seizures, syncope, weakness, light-headedness, numbness and headaches.   Hematological: denies adenopathy, easy bruising, personal or family bleeding history.  Psychiatric/ Behavioral: denies suicidal ideation, mood changes, confusion, nervousness, sleep disturbance and agitation.       All other systems are reviewed and negative.    PHYSICAL EXAM: VS:  BP 130/70 mmHg  Pulse 70  Ht 4\' 10"  (1.473 m)  Wt 113 lb 6.4 oz (51.438 kg)  BMI 23.71 kg/m2 , BMI Body mass index is 23.71 kg/(m^2). GEN: Well nourished, well developed, in no acute distress HEENT: normal Neck: no JVD, carotid bruits, or masses Cardiac: RRR; no murmurs, rubs, or gallops,no edema  Respiratory:  clear to auscultation bilaterally, normal work of breathing GI: soft, nontender, nondistended, + BS MS: no deformity or atrophy Skin: warm and dry, no rash Neuro:  Strength and sensation are  intact Psych: normal   EKG:  EKG is not ordered today. The ekg ordered 07/29/15  demonstrates NSR with no ST changes    Recent Labs: 12/23/2014: ALT 49; BUN 17.7; Creatinine 0.7; HGB 12.6; Platelets 221; Potassium 4.3; Sodium 143 01/03/2015: TSH 1.09    Lipid Panel    Component Value Date/Time   CHOL 196 01/03/2015 1129   TRIG 69.0 01/03/2015 1129   HDL 56.60 01/03/2015 1129   CHOLHDL 3 01/03/2015 1129   VLDL 13.8 01/03/2015 1129   LDLCALC 125* 01/03/2015 1129   LDLDIRECT 131.3 02/29/2012 1043      Wt Readings from Last 3 Encounters:  08/25/15 113 lb 6.4 oz (51.438 kg)  07/29/15  113 lb (51.256 kg)  07/24/15 116 lb 9.6 oz (52.889 kg)      Other studies Reviewed: Additional studies/ records that were reviewed today include: . Review of the above records demonstrates:    ASSESSMENT AND PLAN:  1.  Cardiomegaly - Her heart might have looked big because of her relatively small frame. We'll get an echocardiogram for further evaluation of her cardiac size and function. She does have a history of hypertension. It would not surprise me if she has some degree of mild diastolic dysfunction.  I'll see her back on an as-needed basis-sooner if she has any problems or if the echo is abnormal.  Current medicines are reviewed at length with the patient today.  The patient does not have concerns regarding medicines.  The following changes have been made:  no change  Labs/ tests ordered today include:  No orders of the defined types were placed in this encounter.     Disposition:   FU with me as needed.     Nahser, Wonda Cheng, MD  08/25/2015 2:58 PM    [address] [place], [place], [place]  [address] Phone: [phone]; Fax: [phone]   [facility]  [address] [place], [place]  [address] [phone]   Fax [phone]

## 2015-08-25 NOTE — Patient Instructions (Signed)
Medication Instructions:  Your physician recommends that you continue on your current medications as directed. Please refer to the Current Medication list given to you today.   Labwork: None Ordered   Testing/Procedures: Your physician has requested that you have an echocardiogram. Echocardiography is a painless test that uses sound waves to create images of your heart. It provides your doctor with information about the size and shape of your heart and how well your heart's chambers and valves are working. This procedure takes approximately one hour. There are no restrictions for this procedure.    Follow-Up: Your physician recommends that you schedule a follow-up appointment in: as needed with Dr. Nahser.    If you need a refill on your cardiac medications before your next appointment, please call your pharmacy.   Thank you for choosing [facility]! Tamesha Ellerbrock, RN [phone]    

## 2015-08-26 ENCOUNTER — Other Ambulatory Visit (HOSPITAL_COMMUNITY): Payer: Medicare Other

## 2015-08-26 ENCOUNTER — Institutional Professional Consult (permissible substitution): Payer: Medicare Other | Admitting: Emergency Medicine

## 2015-08-28 ENCOUNTER — Ambulatory Visit (INDEPENDENT_AMBULATORY_CARE_PROVIDER_SITE_OTHER): Payer: Medicare Other | Admitting: Internal Medicine

## 2015-08-28 DIAGNOSIS — J45901 Unspecified asthma with (acute) exacerbation: Secondary | ICD-10-CM | POA: Diagnosis not present

## 2015-08-28 LAB — PULMONARY FUNCTION TEST
DL/VA % pred: 128 %
DL/VA: 4.77 ml/min/mmHg/L
DLCO COR: 15.71 ml/min/mmHg
DLCO cor % pred: 105 %
DLCO unc % pred: 96 %
DLCO unc: 14.3 ml/min/mmHg
FEF 25-75 Post: 1.58 L/sec
FEF 25-75 Pre: 1.38 L/sec
FEF2575-%Change-Post: 13 %
FEF2575-%PRED-PRE: 96 %
FEF2575-%Pred-Post: 109 %
FEV1-%Change-Post: 4 %
FEV1-%PRED-PRE: 94 %
FEV1-%Pred-Post: 98 %
FEV1-POST: 1.57 L
FEV1-PRE: 1.51 L
FEV1FVC-%CHANGE-POST: 0 %
FEV1FVC-%Pred-Pre: 104 %
FEV6-%CHANGE-POST: 3 %
FEV6-%PRED-POST: 98 %
FEV6-%PRED-PRE: 94 %
FEV6-PRE: 1.9 L
FEV6-Post: 1.97 L
FEV6FVC-%PRED-PRE: 106 %
FEV6FVC-%Pred-Post: 106 %
FVC-%CHANGE-POST: 3 %
FVC-%PRED-POST: 92 %
FVC-%Pred-Pre: 89 %
FVC-POST: 1.97 L
FVC-Pre: 1.9 L
POST FEV6/FVC RATIO: 100 %
Post FEV1/FVC ratio: 79 %
Pre FEV1/FVC ratio: 79 %
Pre FEV6/FVC Ratio: 100 %
RV % PRED: 97 %
RV: 1.82 L
TLC % PRED: 95 %
TLC: 3.83 L

## 2015-08-28 NOTE — Progress Notes (Signed)
PFT done today. 

## 2015-08-29 ENCOUNTER — Ambulatory Visit (INDEPENDENT_AMBULATORY_CARE_PROVIDER_SITE_OTHER): Payer: Medicare Other | Admitting: Internal Medicine

## 2015-08-29 ENCOUNTER — Encounter: Payer: Self-pay | Admitting: Internal Medicine

## 2015-08-29 VITALS — BP 106/60 | HR 75 | Ht <= 58 in | Wt 114.0 lb

## 2015-08-29 DIAGNOSIS — J4520 Mild intermittent asthma, uncomplicated: Secondary | ICD-10-CM

## 2015-08-29 NOTE — Patient Instructions (Addendum)
When coughing  >  prilosec 20 x 2 x 30 min bfast and take Pepcid ac 20 mg at bedtime > ok to resume your usual routine when all better x 1 week   GERD (REFLUX)  is an extremely common cause of respiratory symptoms just like yours , many times with no obvious heartburn at all.    It can be treated with medication, but also with lifestyle changes including elevation of the head of your bed (ideally with 6 inch  bed blocks),  Smoking cessation, avoidance of late meals, excessive alcohol, and avoid fatty foods, chocolate, peppermint, colas, red wine, and acidic juices such as orange juice.  NO MINT OR MENTHOL PRODUCTS SO NO COUGH DROPS  USE SUGARLESS CANDY INSTEAD (Jolley ranchers or Stover's or Life Savers) or even ice chips will also do - the key is to swallow to prevent all throat clearing. NO OIL BASED VITAMINS - use powdered substitutes.   Ok to stop symbicort > if worse start it back and follow with [place] if not happy

## 2015-08-29 NOTE — Progress Notes (Signed)
Subjective:     Patient ID: Courtney [name], female   DOB: [birth year], 70 y.o.   MRN: [id number]  HPI  70  yowf quit smoking age 55   with h/o bilateral  breast cancer without XRT, GERD referred to pulmonary clinic 07/24/15 courtesy of Dr.Todd re questionable intermittent asthma  Since mid 80's  pt c/o prod cough light brown in color, wheezing, chest tightness, occ SOB onset mid Jan 2017 while on maint reflux and singulair  But no need for any inhalers prior to [facility] Jan 2017    Dr Janee Morn eval 07/24/2015- Describes  Abrupt mid January with persistent cough/ fatigue but no fever  after that. Improved initially with antibiotics and prednisone but had some residual cough with clear mucus plugs  Pattern continued for several weeks. One week prior to [place]   went to [employer] pool to watch a family member's when. Began coughing again.  Because of worse cough she restarted a prednisone taper using 20 mg tablets 2 days prior to OV    She was using Symbicort /fluticasone   since 06/16/15  Aware of dyspnea on exertion. Sputum is currently light brown without blood. Denies fever, adenopathy, rash. Reflux is controlled, sleeping on a wedge plus pillow and taking PPI. rec Finish the prednisone taper and amoxacillin as directed> diarrhea p one week so stopped since this was the third abx Continue Symbicort 2 puffs then rinse mouth well, twice daily Ok to use the rescue inhaler 2 puffs every 4- 6 hours if needed> no need since then    08/29/2015  f/u ov/Ruhee Enck re: clinical intermittent asthma/ rx  symbicort 160 bid and ppi bid but not ac  Chief Complaint  Patient presents with  . Follow-up    Pt seen by Dr Annamaria Boots 07/24/15. PFT done 08/28/15. She states her breathing is much improved since her last visit here. She is still having some cough, wheezing and chest tightness, but these symptoms are improved. Cough is prod at times with white sputum.    improving to point now able to do two flights ok and only  problem is cough esp in am and plugs are clearer /smaller/ whiter  No obvious day to day or daytime variability or assoc  cp  or overt sinus or hb symptoms. No unusual exp hx or h/o childhood pna/ asthma or knowledge of premature birth.  Sleeping ok without nocturnal  or early am exacerbation  of respiratory  c/o's or need for noct saba. Also denies any obvious fluctuation of symptoms with weather or environmental changes or other aggravating or alleviating factors except as outlined above   Current Medications, Allergies, Complete Past Medical History, Past Surgical History, Family History, and Social History were reviewed in [employer] record.  ROS  The following are not active complaints unless bolded sore throat, dysphagia, dental problems, itching, sneezing,  nasal congestion or excess/ purulent secretions, ear ache,   fever, chills, sweats, unintended wt loss, classically pleuritic or exertional cp, hemoptysis,  orthopnea pnd or leg swelling, presyncope, palpitations, abdominal pain, anorexia, nausea, vomiting, diarrhea  or change in bowel or bladder habits, change in stools or urine, dysuria,hematuria,  rash, arthralgias, visual complaints, headache, numbness, weakness or ataxia or problems with walking or coordination,  change in mood/affect or memory.          Review of Systems     Objective:   Physical Exam    amb pleasant wf nad   Wt Readings from  Last 3 Encounters:  08/29/15 114 lb (51.71 kg)  08/25/15 113 lb 6.4 oz (51.438 kg)  07/29/15 113 lb (51.256 kg)    Vital signs reviewed   HEENT: nl dentition, turbinates, and oropharynx. Nl external ear canals without cough reflex   NECK :  without JVD/Nodes/TM/ nl carotid upstrokes bilaterally   LUNGS: no acc muscle use,  Nl contour chest which is clear to A and P bilaterally without cough on insp or exp maneuvers   CV:  RRR  no s3 or murmur or increase in P2, no edema   ABD:  soft and nontender  with nl inspiratory excursion in the supine position. No bruits or organomegaly, bowel sounds nl  MS:  Nl gait/ ext warm without deformities, calf tenderness, cyanosis or clubbing No obvious joint restrictions   SKIN: warm and dry without lesions    NEURO:  alert, approp, nl sensorium with  no motor deficits    CXR PA and Lateral:   08/29/2015 :    I personally reviewed images and agree with radiology impression as follows:    07/24/15  1. Mild left base subsegmental atelectasis and or infiltrate. 2. Cardiomegaly with normal pulmonary vascularity. My review cxr is wnl   Assessment:

## 2015-08-31 DIAGNOSIS — J4520 Mild intermittent asthma, uncomplicated: Secondary | ICD-10-CM | POA: Insufficient documentation

## 2015-08-31 NOTE — Assessment & Plan Note (Addendum)
PFT's 08/28/15 no sign obstruction - 08/29/2015  After extensive coaching HFA effectiveness =    90% > ok to try "prn symbicort"  Not clear to me why she would suddenly develop chronic asthma at her age and since her pfts are nl as of 08/28/15 she either has a component of cough variant asthma or uacs masquerading as asthma that is difficult to control  DDX of  difficult airways management almost all start with A and  include Adherence, Ace Inhibitors, Acid Reflux, Active Sinus Disease, Alpha 1 Antitripsin deficiency, Anxiety masquerading as Airways dz,  ABPA,  Allergy(esp in young), Aspiration (esp in elderly), Adverse effects of meds,  Active smokers, A bunch of PE's (a small clot burden can't cause this syndrome unless there is already severe underlying pulm or vascular dz with poor reserve) plus two Bs  = Bronchiectasis and Beta blocker use..and one C= CHF   Adherence is always the initial "prime suspect" and is a multilayered concern that requires a "trust but verify" approach in every patient - starting with knowing how to use medications, especially inhalers, correctly, keeping up with refills and understanding the fundamental difference between maintenance and prns vs those medications only taken for a very short course and then stopped and not refilled.  - The proper method of use, as well as anticipated side effects, of a metered-dose inhaler are discussed and demonstrated to the patient. Improved effectiveness after extensive coaching during this visit to a level of approximately 90 % from a baseline of 75 %    ? Acid (or non-acid) GERD > always difficult to exclude as up to 75% of pts in some series report no assoc GI/ Heartburn symptoms> rec max (24h)  acid suppression and diet restrictions/ reviewed and instructions given in writing.  Explained the natural history of uri and why it's necessary in patients at risk to treat GERD aggressively - at least  short term -   to reduce risk of evolving  cyclical cough initially  triggered by epithelial injury and a heightened sensitivty to the effects of any upper airway irritants,  most importantly acid - related - then perpetuated by epithelial injury related to the cough itself as the upper airway collapses on itself.  That is, the more sensitive the epithelium becomes once it is damaged by the virus, the more the ensuing irritability> the more the cough, the more the secondary reflux (especially in those prone to reflux) the more the irritation of the sensitive mucosa and so on in a  Classic cyclical pattern.     ? Allergy > continue singulair for now, prn symbicort should do   I had an extended discussion with the patient reviewing all relevant studies completed to date and  lasting 15 to 20 minutes of a 25 minute visit    Each maintenance medication was reviewed in detail including most importantly the difference between maintenance and prns and under what circumstances the prns are to be triggered using an action plan format that is not reflected in the computer generated alphabetically organized AVS.    Please see instructions for details which were reviewed in writing and the patient given a copy highlighting the part that I personally wrote and discussed at today's ov.

## 2015-09-09 ENCOUNTER — Ambulatory Visit (HOSPITAL_COMMUNITY): Payer: Medicare Other | Attending: Cardiology

## 2015-09-09 ENCOUNTER — Other Ambulatory Visit: Payer: Self-pay

## 2015-09-09 DIAGNOSIS — I517 Cardiomegaly: Secondary | ICD-10-CM

## 2015-09-09 DIAGNOSIS — I059 Rheumatic mitral valve disease, unspecified: Secondary | ICD-10-CM | POA: Diagnosis not present

## 2015-09-09 DIAGNOSIS — I119 Hypertensive heart disease without heart failure: Secondary | ICD-10-CM | POA: Insufficient documentation

## 2015-09-09 DIAGNOSIS — Z87891 Personal history of nicotine dependence: Secondary | ICD-10-CM | POA: Diagnosis not present

## 2015-09-17 ENCOUNTER — Telehealth: Payer: Self-pay

## 2015-09-17 NOTE — Telephone Encounter (Signed)
Patient is on my [employer] list for 2017. Patient has an appt scheduled for 01/2016 with PCP. Pt may be a good candidate for an AWV with our health coach.

## 2015-09-24 NOTE — Telephone Encounter (Signed)
This patient will see Dr. Sherren Mocha and he can refer her for AWV as appropriate. Tks, Sh

## 2015-10-24 ENCOUNTER — Encounter: Payer: Self-pay | Admitting: Genetic Counselor

## 2015-10-29 ENCOUNTER — Encounter: Payer: Self-pay | Admitting: Genetic Counselor

## 2015-11-18 ENCOUNTER — Encounter: Payer: Self-pay | Admitting: Oncology

## 2015-12-05 ENCOUNTER — Encounter: Payer: Self-pay | Admitting: Genetic Counselor

## 2015-12-06 ENCOUNTER — Other Ambulatory Visit: Payer: Self-pay | Admitting: Internal Medicine

## 2015-12-06 ENCOUNTER — Other Ambulatory Visit: Payer: Self-pay | Admitting: Family Medicine

## 2015-12-09 ENCOUNTER — Other Ambulatory Visit (HOSPITAL_BASED_OUTPATIENT_CLINIC_OR_DEPARTMENT_OTHER): Payer: Medicare Other

## 2015-12-09 ENCOUNTER — Ambulatory Visit (HOSPITAL_BASED_OUTPATIENT_CLINIC_OR_DEPARTMENT_OTHER): Payer: Medicare Other | Admitting: Genetic Counselor

## 2015-12-09 DIAGNOSIS — Z315 Encounter for genetic counseling: Secondary | ICD-10-CM | POA: Diagnosis not present

## 2015-12-09 DIAGNOSIS — C50912 Malignant neoplasm of unspecified site of left female breast: Secondary | ICD-10-CM

## 2015-12-09 DIAGNOSIS — Z853 Personal history of malignant neoplasm of breast: Secondary | ICD-10-CM

## 2015-12-09 DIAGNOSIS — Z85828 Personal history of other malignant neoplasm of skin: Secondary | ICD-10-CM

## 2015-12-09 DIAGNOSIS — C50919 Malignant neoplasm of unspecified site of unspecified female breast: Secondary | ICD-10-CM

## 2015-12-09 DIAGNOSIS — C50911 Malignant neoplasm of unspecified site of right female breast: Secondary | ICD-10-CM | POA: Diagnosis not present

## 2015-12-09 DIAGNOSIS — Z808 Family history of malignant neoplasm of other organs or systems: Secondary | ICD-10-CM

## 2015-12-09 DIAGNOSIS — Z8049 Family history of malignant neoplasm of other genital organs: Secondary | ICD-10-CM

## 2015-12-09 DIAGNOSIS — Z800 Family history of malignant neoplasm of digestive organs: Secondary | ICD-10-CM

## 2015-12-09 DIAGNOSIS — Z803 Family history of malignant neoplasm of breast: Secondary | ICD-10-CM

## 2015-12-09 LAB — COMPREHENSIVE METABOLIC PANEL
ALBUMIN: 4.2 g/dL (ref 3.5–5.0)
ALK PHOS: 46 U/L (ref 40–150)
ALT: 47 U/L (ref 0–55)
ANION GAP: 10 meq/L (ref 3–11)
AST: 30 U/L (ref 5–34)
BUN: 17.3 mg/dL (ref 7.0–26.0)
CALCIUM: 10.3 mg/dL (ref 8.4–10.4)
CHLORIDE: 105 meq/L (ref 98–109)
CO2: 29 mEq/L (ref 22–29)
CREATININE: 0.8 mg/dL (ref 0.6–1.1)
EGFR: 73 mL/min/{1.73_m2} — ABNORMAL LOW (ref >90)
Glucose: 103 mg/dl (ref 70–140)
Potassium: 4 mEq/L (ref 3.5–5.1)
Sodium: 143 mEq/L (ref 136–145)
Total Bilirubin: 0.52 mg/dL (ref 0.20–1.20)
Total Protein: 7.4 g/dL (ref 6.4–8.3)

## 2015-12-09 LAB — CBC WITH DIFFERENTIAL/PLATELET
BASO%: 1.9 % (ref 0.0–2.0)
BASOS ABS: 0.1 10*3/uL (ref 0.0–0.1)
EOS%: 2.7 % (ref 0.0–7.0)
Eosinophils Absolute: 0.1 10*3/uL (ref 0.0–0.5)
HEMATOCRIT: 39.9 % (ref 34.8–46.6)
HEMOGLOBIN: 13.1 g/dL (ref 11.6–15.9)
LYMPH#: 1.5 10*3/uL (ref 0.9–3.3)
LYMPH%: 34.5 % (ref 14.0–49.7)
MCH: 29.8 pg (ref 25.1–34.0)
MCHC: 32.8 g/dL (ref 31.5–36.0)
MCV: 90.9 fL (ref 79.5–101.0)
MONO#: 0.4 10*3/uL (ref 0.1–0.9)
MONO%: 8.8 % (ref 0.0–14.0)
NEUT#: 2.3 10*3/uL (ref 1.5–6.5)
NEUT%: 52.1 % (ref 38.4–76.8)
PLATELETS: 217 10*3/uL (ref 145–400)
RBC: 4.38 10*6/uL (ref 3.70–5.45)
RDW: 13.3 % (ref 11.2–14.5)
WBC: 4.4 10*3/uL (ref 3.9–10.3)

## 2015-12-10 ENCOUNTER — Encounter: Payer: Self-pay | Admitting: Genetic Counselor

## 2015-12-10 NOTE — Progress Notes (Signed)
REFERRING PROVIDER: Lurline Del, MD  PRIMARY PROVIDER:  Joycelyn Man, MD  PRIMARY REASON FOR VISIT:  1. History of breast cancer in female   2. Family history of breast cancer in mother   28. Family history of colon cancer in mother   22. Family history of cancer of GI tract   5. History of basal cell cancer   6. Family history of cervical cancer   7. Family history of skin cancer      HISTORY OF PRESENT ILLNESS:   Courtney [name], a 70 y.o. female, was seen for a [facility] cancer genetics consultation at the request of Dr. Jana Hakim due to a personal history of bilateral breast cancer and family history of breast, colon, and other cancers cancer.  Courtney [name] presents to clinic today to discuss the possibility of a hereditary predisposition to cancer, genetic testing, and to further clarify her future cancer risks, as well as potential cancer risks for family members.   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  Hormone receptor statuses were both ER/PR+, Her2-. This was treated with bilateral mastectomies.  Courtney [name] also has a history of basal cell carcinoma of her thigh, diagnosed in 2005, at the age of 37.  Courtney [name] had negative BRACAnalysis (with BART) genetic testing in 2012 through [facility], following her breast cancer diagnosis.   She returns to clinic today to discuss updated genetic testing options.   HORMONAL RISK FACTORS:  Menarche was at age 73.  First live birth at age 21.  OCP use for approximately 7 years.  Ovaries intact: no.  Hysterectomy: yes at age 25 for fibroids.  Menopausal status: postmenopausal.  HRT use: approx 15 years. Colonoscopy: yes; last colonoscopy in 2008; is on a ten-year schedule; history of 1 colon polyp and 2 hyperplastic gastric polyps. Mammogram within the last year: n/a. Number of breast biopsies: 3-4. Up to date  with pelvic exams:  yes. Any excessive radiation exposure/other exposures in the past:  Reports history of some secondhand smoke exposure  Past Medical History:  Diagnosis Date  . Breast cancer [facility]) July 2012   post double mastectomy; Dr Humphrey Rolls  . Diverticulosis   . Family history of breast cancer   . GERD (gastroesophageal reflux disease)    S/P dilation X 1  . Hypertension   . Night sweats   . Reactive airway disease     Past Surgical History:  Procedure Laterality Date  . ABDOMINAL HYSTERECTOMY    . bilateral breast reconstruction  Jan 2013  . BREAST SURGERY  01/13/11   bilateral total mastectomy; Dr Dalbert Batman  . COLONOSCOPY  2014   Dr Olevia Perches; hyperplastic polyp  . ESOPHAGEAL DILATION  2014  . NASAL SEPTUM SURGERY    . RECONSTRUCTION / CORRECTION OF NIPPLE / Tresa Moore  June 2013  . THYROGLOSSAL DUCT CYST      Social History   Social History  . Marital status: Married    Spouse name: N/A  . Number of children: N/A  . Years of education: N/A   Social History Main Topics  . Smoking status: Former Smoker    Years: 5.00    Types: Cigarettes    Quit date: 05/03/1965  . Smokeless tobacco: Never Used     Comment: smoked 1962-1967, less than 1 ppd  . Alcohol use 8.4 oz/week    14 Glasses  of wine per week     Comment: 2-3 /day  . Drug use: No  . Sexual activity: Yes   Other Topics Concern  . None   Social History Narrative  . None     FAMILY HISTORY:  We obtained a detailed, 4-generation family history.  Significant diagnoses are listed below: Family History  Problem Relation Age of Onset  . Breast cancer Mother 51  . Colon cancer Mother 43  . Cervical cancer Mother 38    s/p hysterectomy  . Heart attack Father 57  . Diabetes Father     probable pre DM  . Skin cancer Father     maybe melanoma  . Cancer Sister 64    appendiceal  . Heart attack Maternal Grandfather 50  . Heart attack Paternal Uncle     X2 ; >50y  . Heart attack Maternal Uncle     d. 29  .  Parkinson's disease Maternal Grandmother     d. 81  . Heart attack Paternal Grandfather   . Breast cancer Other     unspecified age; maternal great aunt (MGM's sister)  . Breast cancer Cousin     maternal 1st cousin, once-removed; unspecified age  . Heart attack Paternal Uncle     >50y  . Stroke Neg Hx     Courtney [name] has two sons, ages 20 and 31.  She has one granddaughter who is 68 years old.  Courtney [name] has two full sisters, ages 60 and 15.  Her sister who is currently 71 has a history of appendiceal cancer, diagnosed at age 63.  Courtney [name] other sister has never been diagnosed with cancer.    Courtney [name] mother was diagnosed with cervical cancer at 81 and with colon and breast cancers at the age of 28, and she passed away at 40.  Courtney [name] mother had one full brother who died of a heart attack at 52 years old.  He had two sons and one daughter, none of whom have had cancer.  Courtney [name] maternal grandmother died of parkinson's disease at 75.  She had at least one full sister with a history of breast cancer, and this same sister had a daughter who also had breast cancer.  Courtney [name] maternal grandfather died of a heart attack at 31.    Courtney [name] father died of a heart attack and diabetes at 75.  He had a history of skin cancer, perhaps melanoma.  Courtney [name] father had two full brothers, both of whom died of a heart attack over the age of 29.  Neither of these uncles had any children.  Courtney [name] has no information for her grandmother.  She reports that her grandfather died of a heart attack, but she has no further information for him or for any other paternal relatives.  Patient's maternal  and paternal ancestors are of [place] descent. There is no reported Ashkenazi Jewish ancestry. There is no known consanguinity.  GENETIC COUNSELING ASSESSMENT: Courtney [name] is a 70 y.o. female with a personal and family history of cancer which is somewhat suggestive of a hereditary cancer syndrome and  predisposition to cancer. We, therefore, discussed and recommended the following at today's visit.   DISCUSSION: We reviewed the characteristics, features and inheritance patterns of hereditary cancer syndromes, particularly those caused by mutations within the PALB2, CHEK2, and other breast cancer-related genes more recently added to genetic testing options. We also discussed genetic testing, including the appropriate family members to test,  the process of testing, insurance coverage and turn-around-time for results. We discussed the implications of a negative, positive and/or variant of uncertain significant result. We recommended Courtney [name] pursue genetic testing for the 20-gene Breast/Ovarian Cancer Panel (with MSH2 Exons 1-7 Inversion Analysis) through [facility].  The Breast/Ovarian Cancer Panel offered by [facility] Hope Pigeon, MD) includes sequencing and deletion/duplication analysis for the following 19 genes:  ATM, BARD1, BRCA1, BRCA2, BRIP1, CDH1, CHEK2, FANCC, MLH1, MSH2, MSH6, NBN, PALB2, PMS2, PTEN, RAD51C, RAD51D, TP53, and XRCC2.  This panel also includes deletion/duplication analysis (without sequencing) for one gene, EPCAM.  Based on Courtney [name]'s personal and family history of cancer, she meets medical criteria for genetic testing. Despite that she meets criteria, she may still have an out of pocket cost. We discussed that if her out of pocket cost for testing is over $100, the laboratory will call and confirm whether she wants to proceed with testing.  If the out of pocket cost of testing is less than $100 she will be billed by the genetic testing laboratory.   PLAN: After considering the risks, benefits, and limitations, Courtney [name]  provided informed consent to pursue genetic testing and the blood sample was sent to [facility] for analysis of the 20-gene Breast/Ovarian Cancer Panel with MSH2 Exons 1-7 Inversion Analysis. Results should be available within  approximately 2-3 weeks' time, at which point they will be disclosed by telephone to Ms. Wimberly, as will any additional recommendations warranted by these results. Ms. Foxworthy will receive a summary of her genetic counseling visit and a copy of her results once available. This information will also be available in Epic. We encouraged Ms. Justen to remain in contact with cancer genetics annually so that we can continuously update the family history and inform her of any changes in cancer genetics and testing that may be of benefit for her family. Ms. Kell questions were answered to her satisfaction today. Our contact information was provided should additional questions or concerns arise.  Thank you for the referral and allowing [place] to share in the care of your patient.   Jeanine Luz, MS, [facility] Certified Genetic Counselor [email or web address] Phone: [phone]  The patient was seen for a total of 50 minutes in face-to-face genetic counseling.  This patient was discussed with Drs. Magrinat, Lindi Adie and/or Burr Medico who agrees with the above.    _______________________________________________________________________ For Office Staff:  Number of people involved in session: 1 Was an Intern/ student involved with case: no

## 2015-12-10 NOTE — Telephone Encounter (Signed)
Routing to patient's new pcp to handle 

## 2015-12-22 ENCOUNTER — Other Ambulatory Visit: Payer: Medicare Other

## 2015-12-26 ENCOUNTER — Telehealth: Payer: Self-pay | Admitting: Genetic Counselor

## 2015-12-26 NOTE — Telephone Encounter (Signed)
Discussed with Ms. Ditsworth that her genetic test result was negative for mutations within any of 20 genes on the Breast/Ovarian Cancer Panel through [facility].  Additionally, no uncertain changes were found.  Discussed that this is most likely a reassuring result for [place], especially since most of the breast cancers in the family were diagnosed at older ages.  Discussed that Ms. Donofrio should continue to follow her doctors recommendations for future cancer screening.  Women in the family are still at some increased risks for breast cancer, simply due to the family history.  They should begin annual mammograms at 58, or younger as recommended by their doctor.  Ms. Hochman would like a copy of her result and a copy of the pedigree mailed to her and I am happy to do that.  She is welcome to call with any questions.

## 2015-12-28 NOTE — Progress Notes (Signed)
[place]  [phone] [phone]     ID: Courtney [name] DOB: 11/08/1945  MR#: [id number]  AJO#:[id number]  Patient Care Team: Dorena Cookey, MD as PCP - General (Family Medicine) Chauncey Cruel, MD as Consulting Physician (Oncology)   CHIEF COMPLAINT: bilateral breast cancer  CURRENT TREATMENT: Completing 5 years of letrozole   BREAST CANCER HISTORY: From doctor Dana Allan 06/14/2011 summary:  "#1 status post bilateral mastectomies on 01/13/2011. Patient had a left simple mastectomy that revealed 2 foci of invasive ductal carcinoma one measuring 1.2 cm and a second focus measuring 0.5 cm grade 2 with associated DCIS and LV I. 8 sentinel nodes were negative for metastatic disease. Patient had a right simple mastectomy that showed a 1.3 in centimeter invasive ductal carcinoma grade 2 with associated DCIS no LV I. One sentinel node was negative for metastatic disease. Tumor #1 was estrogen receptor +100% progesterone receptor 100% proliferation marker 14% HER-2/neu negative. Tumor #2 was ER +100% PR +98% proliferation marker 13% and HER-2/neu negative.  #2 patient Had Oncotype DX performed on the largest tumor at the score was 10 giving her a 7% risk of recurrence at 5 years with antiestrogen therapy only.  #3 patient has had bilateral reconstruction performed. As is being done by Dr. Crissie Reese.  #4 patient was started on Letrozole mg daily in October 2012. A total of 5 years as planned."  The patient's subsequent history is as detailed below  INTERVAL HISTORY: (grad vs survivorship) Courtney [name] returns today for followup of her bilateral breast cancers. She has been on letrozole now nearly 5 years. She generally tolerates this well. She thinks it makes her little grumpy but hot flashes are only occasional and vaginal dryness is taking care of with her "miracle bottle" of lubricant. She obtains letrozole at a very good price.  REVIEW OF  SYSTEMS: Detailed review of systems today was otherwise noncontributory  PAST MEDICAL HISTORY: Past Medical History:  Diagnosis Date  . Breast cancer [facility]) July 2012   post double mastectomy; Dr Humphrey Rolls  . Diverticulosis   . Family history of breast cancer   . GERD (gastroesophageal reflux disease)    S/P dilation X 1  . Hypertension   . Night sweats   . Reactive airway disease     PAST SURGICAL HISTORY: Past Surgical History:  Procedure Laterality Date  . ABDOMINAL HYSTERECTOMY    . bilateral breast reconstruction  Jan 2013  . BREAST SURGERY  01/13/11   bilateral total mastectomy; Dr Dalbert Batman  . COLONOSCOPY  2014   Dr Olevia Perches; hyperplastic polyp  . ESOPHAGEAL DILATION  2014  . NASAL SEPTUM SURGERY    . RECONSTRUCTION / CORRECTION OF NIPPLE / Tresa Moore  June 2013  . THYROGLOSSAL DUCT CYST      FAMILY HISTORY Family History  Problem Relation Age of Onset  . Breast cancer Mother 23  . Colon cancer Mother 77  . Cervical cancer Mother 53    s/p hysterectomy  . Heart attack Father 59  . Diabetes Father     probable pre DM  . Skin cancer Father     maybe melanoma  . Cancer Sister 11    appendiceal  . Heart attack Maternal Grandfather 76  . Heart attack Paternal Uncle     X2 ; >50y  . Heart attack Maternal Uncle     d. 49  . Parkinson's disease Maternal Grandmother     d. 24  . Heart attack Paternal Grandfather   .  Breast cancer Other     unspecified age; maternal great aunt (MGM's sister)  . Breast cancer Cousin     maternal 1st cousin, once-removed; unspecified age  . Heart attack Paternal Uncle     >50y  . Stroke Neg Hx    the patient's father died from a myocardial infarction in his 53s. The patient's mother was diagnosed with breast cancer in her 65s and with colon cancer in her 39s. She died at age 23. The patient had no brothers. She had 2 sisters neither her would breast or ovarian cancer.  GYNECOLOGIC HISTORY:  No LMP recorded. Patient is  postmenopausal. Menarche age 78, first live birth age 38, the patient is [place] P2. She underwent total abdominal hysterectomy and bilateral salpingo-oophorectomy at age 55. She took on one replacement until her breast cancer diagnosis and 2012.  SOCIAL HISTORY:  He worked as a Water engineer. Her husband of 53 years, home are C. "Butch" Burrill, is a Pharmacist, community. Their son Carlis Abbott lives in [place] and works for [employer] and Engineer, mining. Son Aaron Edelman lives in [place] and is a Patent examiner. The patient has one biological grandson and 2 step grandchildren. She attends breast [facility] [employer] [facility]    ADVANCED DIRECTIVES: In place. The patient has a living will and her husband is her healthcare power of attorney   HEALTH MAINTENANCE: Social History  Substance Use Topics  . Smoking status: Former Smoker    Years: 5.00    Types: Cigarettes    Quit date: 05/03/1965  . Smokeless tobacco: Never Used     Comment: smoked 1962-1967, less than 1 ppd  . Alcohol use 8.4 oz/week    14 Glasses of wine per week     Comment: 2-3 /day     Colonoscopy: 2012/ [facility]  PAP:  Bone density: 11/05/2013 at [facility]; D- 1.4; showed a statistically significant decrease in bone mineral density of the right femur of 4.3% as compared to 2013  Lipid panel:  No Known Allergies  Current Outpatient Prescriptions  Medication Sig Dispense Refill  . albuterol (PROAIR HFA) 108 (90 Base) MCG/ACT inhaler Inhale 2 puffs into the lungs every 4 (four) hours as needed for wheezing or shortness of breath.    Marland Kitchen amLODipine (NORVASC) 5 MG tablet TAKE 1 TABLET(5 MG) BY MOUTH DAILY 90 tablet 3  . amLODipine (NORVASC) 5 MG tablet TAKE 1 TABLET(5 MG) BY MOUTH DAILY 90 tablet 0  . Ascorbic Acid (VITAMIN C) 1000 MG tablet Take 1,000 mg by mouth daily.    Marland Kitchen aspirin 81 MG tablet Take 81 mg by mouth daily.      . Azelaic Acid (FINACEA EX) Apply topically as directed.    . budesonide-formoterol (SYMBICORT) 160-4.5 MCG/ACT inhaler INHALE 1 OR 2 PUFFS EVERY 12  HOURS. GARGLE AND SPIT AFTER EACH USE. 10.2 g 5  . calcium citrate-vitamin D (CITRACAL+D) 315-200 MG-UNIT per tablet Take 1 tablet by mouth daily.    . Cholecalciferol (VITAMIN D3) 1000 UNITS CAPS Take by mouth daily.      Marland Kitchen co-enzyme Q-10 30 MG capsule Take 50 mg by mouth daily.     . fluticasone (FLONASE) 50 MCG/ACT nasal spray Place 1 spray into both nostrils daily. 16 g 9  . gabapentin (NEURONTIN) 300 MG capsule Take 1 capsule (300 mg total) by mouth at bedtime. 90 capsule 4  . Ginseng (ELEUTHERO PO) Take by mouth as directed.    . Glucosamine Sulfate 500 MG CAPS Take 3 capsules by mouth daily.    Marland Kitchen  HORSE CHESTNUT PO Take 250 mg by mouth daily.      Marland Kitchen losartan-hydrochlorothiazide (HYZAAR) 100-12.5 MG tablet TAKE 1 TABLET BY MOUTH EVERY DAY 90 tablet 0  . losartan-hydrochlorothiazide (HYZAAR) 100-12.5 MG tablet TAKE 1 TABLET BY MOUTH EVERY DAY 90 tablet 0  . Misc Natural Products (TART CHERRY ADVANCED PO) Take 2,400 mg by mouth daily.    . montelukast (SINGULAIR) 10 MG tablet TAKE 1 TABLET BY MOUTH AT BEDTIME 30 tablet 11  . Multiple Vitamins-Minerals (MULTIVITAMIN WITH MINERALS) tablet Take 2 tablets by mouth daily.      . OMEGA 3 1000 MG CAPS Take 1,000 mg by mouth daily.      Marland Kitchen omeprazole (PRILOSEC) 20 MG capsule Take 1 capsule (20 mg total) by mouth 2 (two) times daily before a meal. 180 capsule 2  . predniSONE (DELTASONE) 20 MG tablet 2 tabs x 3 days, 1 tab x 3 days, 1/2 tab x 3 days, 1/2 tab M,W,F x 2 weeks 40 tablet 1  . psyllium (METAMUCIL) 58.6 % powder Take 1 packet by mouth daily.    Marland Kitchen pyridOXINE (VITAMIN B-6) 50 MG tablet Take 50 mg by mouth daily.    Marland Kitchen tretinoin (RETIN-A) 0.025 % cream Apply topically at bedtime.     No current facility-administered medications for this visit.     Middle-aged white woman who appears stated age  43:   12/29/15 1143  BP: (!) 160/77  Pulse: 80  Resp: 18  Temp: 98.3 F (36.8 C)     Body mass index is 25.28 kg/m.    ECOG FS:0 -  Asymptomatic  Sclerae unicteric, EOMs intact Oropharynx clear and moist No cervical or supraclavicular adenopathy Lungs no rales or rhonchi Heart regular rate and rhythm Abd soft, nontender, positive bowel sounds MSK no focal spinal tenderness, no upper extremity lymphedema Neuro: nonfocal, well oriented, appropriate affect Breasts: Status post bilateral mastectomies with bilateral implant reconstruction. There is no evidence of local recurrence. Both axillae are benign.   LAB RESULTS:  CMP     Component Value Date/Time   NA 143 12/09/2015 1358   K 4.0 12/09/2015 1358   CL 104 06/08/2012 1202   CO2 29 12/09/2015 1358   GLUCOSE 103 12/09/2015 1358   GLUCOSE 105 (H) 06/08/2012 1202   BUN 17.3 12/09/2015 1358   CREATININE 0.8 12/09/2015 1358   CALCIUM 10.3 12/09/2015 1358   PROT 7.4 12/09/2015 1358   ALBUMIN 4.2 12/09/2015 1358   AST 30 12/09/2015 1358   ALT 47 12/09/2015 1358   ALKPHOS 46 12/09/2015 1358   BILITOT 0.52 12/09/2015 1358   GFRNONAA >60 01/11/2011 1407   GFRAA >60 01/11/2011 1407    I No results found for: SPEP  Lab Results  Component Value Date   WBC 4.4 12/09/2015   NEUTROABS 2.3 12/09/2015   HGB 13.1 12/09/2015   HCT 39.9 12/09/2015   MCV 90.9 12/09/2015   PLT 217 12/09/2015      Chemistry      Component Value Date/Time   NA 143 12/09/2015 1358   K 4.0 12/09/2015 1358   CL 104 06/08/2012 1202   CO2 29 12/09/2015 1358   BUN 17.3 12/09/2015 1358   CREATININE 0.8 12/09/2015 1358      Component Value Date/Time   CALCIUM 10.3 12/09/2015 1358   ALKPHOS 46 12/09/2015 1358   AST 30 12/09/2015 1358   ALT 47 12/09/2015 1358   BILITOT 0.52 12/09/2015 1358       Lab Results  Component Value Date   LABCA2 19 11/18/2010    No components found for: [id number]  No results for input(s): INR in the last 168 hours.  Urinalysis    Component Value Date/Time   COLORURINE YELLOW 01/11/2011 1406   APPEARANCEUR CLEAR 01/11/2011 1406   LABSPEC  1.026 01/11/2011 1406   PHURINE 5.5 01/11/2011 1406   GLUCOSEU NEGATIVE 01/11/2011 1406   [place] 01/11/2011 1406   [place] 01/11/2011 1406   KETONESUR NEGATIVE 01/11/2011 1406   PROTEINUR NEGATIVE 01/11/2011 1406   UROBILINOGEN 0.2 01/11/2011 1406   NITRITE NEGATIVE 01/11/2011 1406   LEUKOCYTESUR NEGATIVE 01/11/2011 1406    STUDIES: No results found.  ASSESSMENT: 70 y.o. [BRCA negative] Courtney [name] woman status post bilateral breast biopsies 11/30/2010, both showing invasive ductal carcinoma, both showing strong estrogen and progesterone receptor positivity, low MIB-1-once (13 or less) and no HER-2 amplification.  (1) status post bilateral mastectomies with bilateral sentinel lymph node sampling 01/13/2011, showing  (a) on the left, 2 foci of invasive ductal carcinoma, the larger measuring 1.2 cm, grade 2, both tumors being strongly estrogen and progesterone receptor positive, with MIB-once of 13-14%, and HER-2/neu negative. Her (pT1c pN0 = stage IA)  (b) on the right, apT1c pN0, stage IA invasive ductal carcinoma, grade 2, estrogen receptor 100% positive, progesterone receptor 9% positive, with an MIB-1 of 12% and no HER-2 amplification  (2) Oncotype DX on the largest tumor showed a score of 10, suggesting a risk of recurrence outside of the breast within the next 10 years would be 7% if the patient only systemic therapy was tamoxifen for 5 years. It also predicts no benefit from chemotherapy  (3) letrozole was started October of 2012  (4) Osteopenia, with T score of -1.4 on bone density at [facility] 11/05/2013.   PLAN: Courtney [name] is now 5 years out from definitive surgery for her early stage breast cancer, with no evidence of disease recurrence. This is very favorable.  She is just about 5 years into her anti-estrogen therapy. We discussed continuing letrozole for an additional 5 years. In patients at high risk of recurrence there may be an additional 2 or 3% risk reduction,  but in someone like Courtney [name] the benefit is likely to be closer to 1% or so. This is not motivating to her.  Accordingly she is stopping letrozole and she runs out of the current bottle. At this point I'm comfortable releasing her back to her primary care physician. We'll she will need in terms of breast cancer follow-up is her yearly mammography and a yearly physician breast exam.  She is interested in participating in our survivorship program and I have made her a return appointment here in 6 months with our survivorship nurse practitioner.  I will be glad to see her at any point in the future if on when the need arises but as of now I'm making further routine appointments for her with me here  Chauncey Cruel, MD   12/29/2015 8:14 PM

## 2015-12-29 ENCOUNTER — Telehealth: Payer: Self-pay | Admitting: Oncology

## 2015-12-29 ENCOUNTER — Ambulatory Visit (HOSPITAL_BASED_OUTPATIENT_CLINIC_OR_DEPARTMENT_OTHER): Payer: Medicare Other | Admitting: Oncology

## 2015-12-29 ENCOUNTER — Ambulatory Visit: Payer: Self-pay | Admitting: Genetic Counselor

## 2015-12-29 VITALS — BP 160/77 | HR 80 | Temp 98.3°F | Resp 18 | Ht <= 58 in | Wt 116.8 lb

## 2015-12-29 DIAGNOSIS — Z170 Estrogen receptor positive status [ER+]: Secondary | ICD-10-CM

## 2015-12-29 DIAGNOSIS — Z803 Family history of malignant neoplasm of breast: Secondary | ICD-10-CM

## 2015-12-29 DIAGNOSIS — Z853 Personal history of malignant neoplasm of breast: Secondary | ICD-10-CM

## 2015-12-29 DIAGNOSIS — Z79811 Long term (current) use of aromatase inhibitors: Secondary | ICD-10-CM | POA: Diagnosis not present

## 2015-12-29 DIAGNOSIS — M8580 Other specified disorders of bone density and structure, unspecified site: Secondary | ICD-10-CM

## 2015-12-29 DIAGNOSIS — C50911 Malignant neoplasm of unspecified site of right female breast: Secondary | ICD-10-CM

## 2015-12-29 DIAGNOSIS — Z800 Family history of malignant neoplasm of digestive organs: Secondary | ICD-10-CM

## 2015-12-29 DIAGNOSIS — C50912 Malignant neoplasm of unspecified site of left female breast: Secondary | ICD-10-CM

## 2015-12-29 DIAGNOSIS — Z85828 Personal history of other malignant neoplasm of skin: Secondary | ICD-10-CM

## 2015-12-29 DIAGNOSIS — Z1379 Encounter for other screening for genetic and chromosomal anomalies: Secondary | ICD-10-CM

## 2015-12-29 DIAGNOSIS — Z809 Family history of malignant neoplasm, unspecified: Secondary | ICD-10-CM

## 2015-12-29 NOTE — Telephone Encounter (Signed)
appt made and avs printed °

## 2015-12-30 ENCOUNTER — Other Ambulatory Visit (INDEPENDENT_AMBULATORY_CARE_PROVIDER_SITE_OTHER): Payer: Medicare Other

## 2015-12-30 DIAGNOSIS — Z1159 Encounter for screening for other viral diseases: Secondary | ICD-10-CM

## 2015-12-30 DIAGNOSIS — Z0000 Encounter for general adult medical examination without abnormal findings: Secondary | ICD-10-CM

## 2015-12-30 LAB — LIPID PANEL
Cholesterol: 210 mg/dL — ABNORMAL HIGH (ref 0–200)
HDL: 64.1 mg/dL (ref >39.00)
LDL Cholesterol: 131 mg/dL — ABNORMAL HIGH (ref 0–99)
NonHDL: 146.04
TRIGLYCERIDES: 73 mg/dL (ref 0.0–149.0)
Total CHOL/HDL Ratio: 3
VLDL: 14.6 mg/dL (ref 0.0–40.0)

## 2015-12-30 LAB — POC URINALSYSI DIPSTICK (AUTOMATED)
BILIRUBIN UA: NEGATIVE
GLUCOSE UA: NEGATIVE
Ketones, UA: NEGATIVE
NITRITE UA: NEGATIVE
Protein, UA: NEGATIVE
Spec Grav, UA: 1.02
UROBILINOGEN UA: 0.2
pH, UA: 6

## 2015-12-30 LAB — HEPATIC FUNCTION PANEL
ALBUMIN: 4.4 g/dL (ref 3.5–5.2)
ALK PHOS: 35 U/L — AB (ref 39–117)
ALT: 41 U/L — ABNORMAL HIGH (ref 0–35)
AST: 27 U/L (ref 0–37)
Bilirubin, Direct: 0.1 mg/dL (ref 0.0–0.3)
TOTAL PROTEIN: 7 g/dL (ref 6.0–8.3)
Total Bilirubin: 0.8 mg/dL (ref 0.2–1.2)

## 2015-12-30 LAB — CBC WITH DIFFERENTIAL/PLATELET
BASOS ABS: 0.1 10*3/uL (ref 0.0–0.1)
BASOS PCT: 1.5 % (ref 0.0–3.0)
EOS ABS: 0.2 10*3/uL (ref 0.0–0.7)
Eosinophils Relative: 4 % (ref 0.0–5.0)
HEMATOCRIT: 38.3 % (ref 36.0–46.0)
Hemoglobin: 12.9 g/dL (ref 12.0–15.0)
LYMPHS ABS: 1.6 10*3/uL (ref 0.7–4.0)
LYMPHS PCT: 37.1 % (ref 12.0–46.0)
MCHC: 33.8 g/dL (ref 30.0–36.0)
MCV: 90.5 fl (ref 78.0–100.0)
MONOS PCT: 7.9 % (ref 3.0–12.0)
Monocytes Absolute: 0.3 10*3/uL (ref 0.1–1.0)
NEUTROS ABS: 2.1 10*3/uL (ref 1.4–7.7)
NEUTROS PCT: 49.5 % (ref 43.0–77.0)
PLATELETS: 217 10*3/uL (ref 150.0–400.0)
RBC: 4.23 Mil/uL (ref 3.87–5.11)
RDW: 13.8 % (ref 11.5–15.5)
WBC: 4.3 10*3/uL (ref 4.0–10.5)

## 2015-12-30 LAB — BASIC METABOLIC PANEL
BUN: 14 mg/dL (ref 6–23)
CALCIUM: 9.3 mg/dL (ref 8.4–10.5)
CHLORIDE: 106 meq/L (ref 96–112)
CO2: 28 meq/L (ref 19–32)
CREATININE: 0.67 mg/dL (ref 0.40–1.20)
GFR: 91.95 mL/min (ref >60.00)
GLUCOSE: 102 mg/dL — AB (ref 70–99)
Potassium: 3.9 mEq/L (ref 3.5–5.1)
Sodium: 144 mEq/L (ref 135–145)

## 2015-12-30 LAB — TSH: TSH: 3.57 u[IU]/mL (ref 0.35–4.50)

## 2015-12-31 LAB — HEPATITIS C ANTIBODY: HCV AB: NEGATIVE

## 2016-01-01 ENCOUNTER — Encounter: Payer: Self-pay | Admitting: Oncology

## 2016-01-06 ENCOUNTER — Ambulatory Visit (INDEPENDENT_AMBULATORY_CARE_PROVIDER_SITE_OTHER): Payer: Medicare Other | Admitting: Family Medicine

## 2016-01-06 ENCOUNTER — Encounter: Payer: Self-pay | Admitting: Family Medicine

## 2016-01-06 VITALS — BP 108/60 | HR 71 | Temp 98.5°F | Ht <= 58 in | Wt 115.3 lb

## 2016-01-06 DIAGNOSIS — M129 Arthropathy, unspecified: Secondary | ICD-10-CM

## 2016-01-06 DIAGNOSIS — Z23 Encounter for immunization: Secondary | ICD-10-CM | POA: Diagnosis not present

## 2016-01-06 DIAGNOSIS — J4520 Mild intermittent asthma, uncomplicated: Secondary | ICD-10-CM

## 2016-01-06 DIAGNOSIS — C50911 Malignant neoplasm of unspecified site of right female breast: Secondary | ICD-10-CM

## 2016-01-06 DIAGNOSIS — I10 Essential (primary) hypertension: Secondary | ICD-10-CM

## 2016-01-06 DIAGNOSIS — IMO0002 Reserved for concepts with insufficient information to code with codable children: Secondary | ICD-10-CM

## 2016-01-06 DIAGNOSIS — Z0000 Encounter for general adult medical examination without abnormal findings: Secondary | ICD-10-CM

## 2016-01-06 MED ORDER — AMLODIPINE BESYLATE 5 MG PO TABS: ORAL_TABLET | ORAL | 3 refills | Status: DC

## 2016-01-06 MED ORDER — LOSARTAN POTASSIUM-HCTZ 100-12.5 MG PO TABS: 1.0000 | ORAL_TABLET | Freq: Every day | ORAL | 3 refills | Status: DC

## 2016-01-06 NOTE — Patient Instructions (Signed)
Continue current medications,,,,,,,, check a blood pressure daily for 2 weeks and fax me the data at [phone].  Continue exercise program  Return in one year for general physical exam sooner if any problems  At the first sign of a cold I would treated aggressively with hydration and cough syrup. At the first sign of any wheezing I would restart your pulmonary medicines and the prednisone

## 2016-01-06 NOTE — Progress Notes (Signed)
Pre visit review using our clinic review tool, if applicable. No additional management support is needed unless otherwise documented below in the visit note. 

## 2016-01-06 NOTE — Progress Notes (Signed)
Courtney [name] is a 70 year old married female nonsmoker who comes in today for general physical examination because of a history of breast cancer and hypertension and osteopenia  Last winter was problematic because she developed a viral infection that caused severe reactive airway disease. He required medication including steroids and it took 4-6 weeks for her symptoms to abate.  She takes Norvasc and losartan for hypertension. BP 108/60. She says she is not lightheaded when she stands up.  She says when she sleeps at night sometimes she wakes up in her arms will tingle. No claudication. She does have trouble with her right hip. Sometimes it hurts when she begins to walk sometimes is worse after she walks a while. She's taken some over-the-counter medication but no nonsteroidal anti-inflammatories. Advised against that because of her blood pressure medication. She walks 4 times weekly does water aerobics and yoga.  She had a bone density this past summer which shows osteopenia  She wonders if she is due for follow-up colonoscopy. She had one in 2013 which was normal. Prior colonoscopy had some polyps. Her mother had colon cancer.  She gets routine eye care, dental care, no need for follow-up mammography. She's had both breast removed in bilateral implants.  Lipids are normal with only slight elevation of her LDL which is not significant.  Cognitive function normal she walks on a daily basis home health safety reviewed no issues identified, guns in the house but they're locked up, she does have a healthcare power of attorney and living well.  Vaccinations up-to-date  Vital signs stable she's afebrile HEENT were negative neck was supple no adenopathy thyroid normal. Lungs are clear to auscultation cardiac exam was normal. Abdominal exam normal. Pelvic and rectal deferred. She had her uterus and ovaries removed. 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 This is done by dermatology. Upper extremities normal pulses do not disappear when she raises her arms.  Impression hypertension at goal........... continue current therapy  Status post breast cancer with bilateral mastectomies and bilateral implants 5+ years ago off all medication asymptomatic  Osteoarthritis minor right hip........... continue exercise  Family history of colon cancer on her mother's side........ call GI to find out when her next colonoscopy is due.  History of severe reactive airway disease triggered by a viral infection...Marland KitchenMarland KitchenMarland Kitchen keep all her inhalers and steroids at home discuss starting immediately if she has any symptoms

## 2016-01-07 ENCOUNTER — Other Ambulatory Visit: Payer: Self-pay | Admitting: Oncology

## 2016-01-07 ENCOUNTER — Telehealth: Payer: Self-pay | Admitting: *Deleted

## 2016-01-07 DIAGNOSIS — C50911 Malignant neoplasm of unspecified site of right female breast: Secondary | ICD-10-CM

## 2016-01-07 MED ORDER — GABAPENTIN 300 MG PO CAPS: 300.0000 mg | ORAL_CAPSULE | Freq: Every day | ORAL | 4 refills | Status: DC

## 2016-01-07 NOTE — Telephone Encounter (Signed)
"  I need my gabapentin refilled.  I also need to know if the bone density has been reviewed.  Do I need to receive the calcium infusion."

## 2016-01-07 NOTE — Telephone Encounter (Signed)
Left message for pt to return call tomorrow to call us back to let us know where she had her bone density.

## 2016-01-09 NOTE — Telephone Encounter (Signed)
Bone density report received and placed in Dr Magrinat's in basket with labs and note asking if she needs zometa scheduled.

## 2016-01-09 NOTE — Telephone Encounter (Signed)
S/w pt and she contacted [facility] yesterday for them to forward bone density test.

## 2016-01-14 ENCOUNTER — Telehealth: Payer: Self-pay | Admitting: Internal Medicine

## 2016-01-14 NOTE — Telephone Encounter (Signed)
OK 

## 2016-01-15 NOTE — Telephone Encounter (Signed)
Dr. Carlean Purl when is Pt due for a recall colon?  Pt states she thinks it should be sooner than 2023.  Thanks

## 2016-01-15 NOTE — Telephone Encounter (Signed)
This RN post MD review called pt and informed her MD opinion that zometa is not recommended at this time.  Pt needs to use vitamin d and weight bearing exercise.  Per discussion pt verbalized understanding.

## 2016-01-15 NOTE — Telephone Encounter (Signed)
Chart reviewed  Mother had colon cancer 52 Sister had appendiceal cancer at 38  So fHx colon cancer  She should have a recall for 12/2016 based upn this  Am ccing PJ and she will do the recall if you call the patient unless you can do the recall

## 2016-01-15 NOTE — Telephone Encounter (Signed)
Courtney [name] I put the recall in.

## 2016-01-16 NOTE — Telephone Encounter (Signed)
LM on Vmail to call back.  Pt is due for a recall colon in 12/2016 per Dr. Carlean Purl

## 2016-03-01 DIAGNOSIS — Z1379 Encounter for other screening for genetic and chromosomal anomalies: Secondary | ICD-10-CM | POA: Insufficient documentation

## 2016-03-01 NOTE — Progress Notes (Signed)
GENETIC TEST RESULT  HPI: Ms. Rubey was previously seen in the [place] clinic due to a personal and family history of breast cancer and concerns regarding a hereditary predisposition to cancer. Please refer to our prior cancer genetics clinic note from December 09, 2015 for more information regarding Ms. Mcnatt's medical, social and family histories, and our assessment and recommendations, at the time. Ms. Schreur recent genetic test results were disclosed to her, as were recommendations warranted by these results. These results and recommendations are discussed in more detail below.  GENETIC TEST RESULTS: At the time of Ms. Marchitto's visit on 8//8/17, we recommended she pursue genetic testing of the 20-gene Breast/Ovarian Cancer Panel with MSH2 Exons 1-7 Inversion Analysis through [facility]. The Breast/Ovarian Cancer Panel offered by [facility] Hope Pigeon, MD) includes sequencing and deletion/duplication analysis for the following 19 genes:  ATM, BARD1, BRCA1, BRCA2, BRIP1, CDH1, CHEK2, FANCC, MLH1, MSH2, MSH6, NBN, PALB2, PMS2, PTEN, RAD51C, RAD51D, TP53, and XRCC2.  This panel also includes deletion/duplication analysis (without sequencing) for one gene, EPCAM.  Those results are now back, the report date for which is December 25, 2015.  Genetic testing was normal, and did not reveal a deleterious mutation in these genes. Additionally, no variants of uncertain significance (VUSes) were found.  The test report will be scanned into EPIC and will be located under the Results Review tab in the Pathology>Molecular Pathology section.   We discussed with Ms. Dockham that since the current genetic testing is not perfect, it is possible there may be a gene mutation in one of these genes that current testing cannot detect, but that chance is small. We also discussed, that it is possible that another gene that has not yet been discovered, or that we have not yet tested, is responsible for  the cancer diagnoses in the family, and it is, therefore, important to remain in touch with cancer genetics in the future so that we can continue to offer Ms. Chestnutt the most up-to-date genetic testing.    CANCER SCREENING RECOMMENDATIONS: This result is reassuring and indicates that Ms. Candy likely does not have an increased risk for a future cancer due to a mutation in one of these genes. This normal test also suggests that Ms. Vanhandel's cancer was most likely not due to an inherited predisposition associated with one of these genes.  Most cancers happen by chance and this negative test suggests that her cancer falls into this category.  We, therefore, recommended she continue to follow the cancer management and screening guidelines provided by her oncology and primary healthcare providers.   RECOMMENDATIONS FOR FAMILY MEMBERS: Men and women in this family might be at some increased risk of developing cancer, over the general population risk, simply due to the family history of cancer. We recommended women in this family have a yearly mammogram beginning at age 77, or 79 years younger than the earliest onset of cancer, an annual clinical breast exam, and perform monthly breast self-exams. Women in this family should also have a gynecological exam as recommended by their primary provider. All family members should have a colonoscopy by age 62.  FOLLOW-UP: Lastly, we discussed with Ms. Sorenson that cancer genetics is a rapidly advancing field and it is possible that new genetic tests will be appropriate for her and/or her family members in the future. We encouraged her to remain in contact with cancer genetics on an annual basis so we can update her personal and family histories and let  her know of advances in cancer genetics that may benefit this family.   Our contact number was provided. Ms. Tuckerman questions were answered to her satisfaction, and she knows she is welcome to call us at anytime with additional  questions or concerns.   Jeanine Luz, MS, [facility] Certified Genetic Counselor [email or web address] Phone: [phone]

## 2016-03-06 ENCOUNTER — Other Ambulatory Visit: Payer: Self-pay | Admitting: Internal Medicine

## 2016-03-22 ENCOUNTER — Other Ambulatory Visit: Payer: Self-pay | Admitting: Family Medicine

## 2016-06-30 ENCOUNTER — Other Ambulatory Visit: Payer: Self-pay | Admitting: Internal Medicine

## 2016-07-01 NOTE — Telephone Encounter (Signed)
Routing to patient's new pcp to handle 

## 2016-08-23 ENCOUNTER — Encounter: Payer: Medicare Other | Admitting: Adult Health

## 2016-08-23 ENCOUNTER — Encounter: Payer: Self-pay | Admitting: Adult Health

## 2016-08-23 ENCOUNTER — Ambulatory Visit (HOSPITAL_BASED_OUTPATIENT_CLINIC_OR_DEPARTMENT_OTHER): Payer: Medicare Other | Admitting: Adult Health

## 2016-08-23 VITALS — BP 110/55 | HR 73 | Temp 98.4°F | Resp 18 | Ht <= 58 in | Wt 118.0 lb

## 2016-08-23 DIAGNOSIS — C50911 Malignant neoplasm of unspecified site of right female breast: Secondary | ICD-10-CM | POA: Diagnosis not present

## 2016-08-23 DIAGNOSIS — M8580 Other specified disorders of bone density and structure, unspecified site: Secondary | ICD-10-CM | POA: Diagnosis not present

## 2016-08-23 DIAGNOSIS — C50912 Malignant neoplasm of unspecified site of left female breast: Secondary | ICD-10-CM | POA: Diagnosis not present

## 2016-08-23 DIAGNOSIS — Z79811 Long term (current) use of aromatase inhibitors: Secondary | ICD-10-CM | POA: Diagnosis not present

## 2016-08-23 DIAGNOSIS — Z170 Estrogen receptor positive status [ER+]: Secondary | ICD-10-CM | POA: Diagnosis not present

## 2016-08-23 NOTE — Progress Notes (Signed)
CLINIC:  Survivorship   REASON FOR VISIT:  Routine follow-up for history of breast cancer.   BRIEF ONCOLOGIC HISTORY:  (1) status post bilateral mastectomies with bilateral sentinel lymph node sampling 01/13/2011, showing             (a) on the left, 2 foci of invasive ductal carcinoma, the larger measuring 1.2 cm, grade 2, both tumors being strongly estrogen and progesterone receptor positive, with MIB-once of 13-14%, and HER-2/neu negative. Her (pT1c pN0 = stage IA)             (b) on the right, apT1c pN0, stage IA invasive ductal carcinoma, grade 2, estrogen receptor 100% positive, progesterone receptor 9% positive, with an MIB-1 of 12% and no HER-2 amplification  (2) Oncotype DX on the largest tumor showed a score of 10, suggesting a risk of recurrence outside of the breast within the next 10 years would be 7% if the patient only systemic therapy was tamoxifen for 5 years. It also predicts no benefit from chemotherapy  (3) letrozole was started October of 2012  (4) Osteopenia, with T score of -1.4 on bone density at [facility] 11/05/2013.    INTERVAL HISTORY:  Ms. Weare presents to the Survivorship Clinic today for routine follow-up for her history of breast cancer.  Overall, she reports feeling quite well. She exercises most days of the week including water aerobics, walking, and yoga.  She does communicate a fear of recurrence and wishing that there were a way to know if this was going to be cured.    REVIEW OF SYSTEMS:  Review of Systems  Constitutional: Negative for chills, fever, malaise/fatigue and weight loss.  HENT: Negative for hearing loss and tinnitus.   Eyes: Negative for blurred vision and double vision.  Respiratory: Negative for cough and shortness of breath.   Cardiovascular: Negative for chest pain, palpitations and leg swelling.  Gastrointestinal: Negative for abdominal pain, blood in stool, constipation, diarrhea, heartburn, melena, nausea and vomiting.    Genitourinary: Negative for dysuria and urgency.  Musculoskeletal: Negative for myalgias.  Skin: Negative for rash.  Neurological: Negative for dizziness and headaches.  Endo/Heme/Allergies: Negative for environmental allergies. Does not bruise/bleed easily.  Psychiatric/Behavioral: Negative for depression. The patient is nervous/anxious.   Breast: Denies any new nodularity, masses, tenderness, nipple changes, or nipple discharge.        PAST MEDICAL/SURGICAL HISTORY:  Past Medical History:  Diagnosis Date  . Breast cancer [facility]) July 2012   post double mastectomy; Dr Humphrey Rolls  . Diverticulosis   . Family history of breast cancer   . GERD (gastroesophageal reflux disease)    S/P dilation X 1  . Hypertension   . Night sweats   . Reactive airway disease    Past Surgical History:  Procedure Laterality Date  . ABDOMINAL HYSTERECTOMY    . bilateral breast reconstruction  Jan 2013  . BREAST SURGERY  01/13/11   bilateral total mastectomy; Dr Dalbert Batman  . COLONOSCOPY  2014   Dr Olevia Perches; hyperplastic polyp  . ESOPHAGEAL DILATION  2014  . NASAL SEPTUM SURGERY    . RECONSTRUCTION / CORRECTION OF NIPPLE / Tresa Moore  June 2013  . THYROGLOSSAL DUCT CYST       ALLERGIES:  No Known Allergies   CURRENT MEDICATIONS:  Outpatient Encounter Prescriptions as of 08/23/2016  Medication Sig  . albuterol (PROAIR HFA) 108 (90 Base) MCG/ACT inhaler Inhale 2 puffs into the lungs every 4 (four) hours as needed for wheezing or shortness of breath.  Marland Kitchen  amLODipine (NORVASC) 5 MG tablet TAKE 1 TABLET(5 MG) BY MOUTH DAILY  . Ascorbic Acid (VITAMIN C) 1000 MG tablet Take 1,000 mg by mouth daily.  Marland Kitchen aspirin 81 MG tablet Take 81 mg by mouth daily.    . budesonide-formoterol (SYMBICORT) 160-4.5 MCG/ACT inhaler INHALE 1 OR 2 PUFFS EVERY 12 HOURS. GARGLE AND SPIT AFTER EACH USE.  . calcium citrate-vitamin D (CITRACAL+D) 315-200 MG-UNIT per tablet Take 1 tablet by mouth daily.  . Cholecalciferol (VITAMIN D3) 1000  UNITS CAPS Take by mouth daily.    Marland Kitchen co-enzyme Q-10 30 MG capsule Take 50 mg by mouth daily.   Marland Kitchen FINACEA 15 % cream Apply 1 application topically 2 (two) times daily. For rosacea  . gabapentin (NEURONTIN) 300 MG capsule Take 1 capsule (300 mg total) by mouth at bedtime.  . Ginseng (ELEUTHERO PO) Take by mouth as directed.  . Glucosamine Sulfate 500 MG CAPS Take 3 capsules by mouth daily.  Marland Kitchen HORSE CHESTNUT PO Take 250 mg by mouth daily.    Marland Kitchen losartan-hydrochlorothiazide (HYZAAR) 100-12.5 MG tablet Take 1 tablet by mouth daily.  . Misc Natural Products (TART CHERRY ADVANCED PO) Take 2,400 mg by mouth daily.  . montelukast (SINGULAIR) 10 MG tablet TAKE 1 TABLET BY MOUTH AT BEDTIME  . Multiple Vitamins-Minerals (MULTIVITAMIN WITH MINERALS) tablet Take 2 tablets by mouth daily.    . OMEGA 3 1000 MG CAPS Take 1,000 mg by mouth daily.    Marland Kitchen omeprazole (PRILOSEC) 20 MG capsule TAKE 1 CAPSULE BY MOUTH TWICE DAILY BEFORE A MEAL  . psyllium (METAMUCIL) 58.6 % powder Take 1 packet by mouth daily.  Marland Kitchen pyridOXINE (VITAMIN B-6) 50 MG tablet Take 50 mg by mouth daily.  Marland Kitchen tretinoin (RETIN-A) 0.025 % cream Apply topically at bedtime.  . fluticasone (FLONASE) 50 MCG/ACT nasal spray Place 1 spray into both nostrils daily. (Patient not taking: Reported on 01/06/2016)  . [DISCONTINUED] Azelaic Acid (FINACEA EX) Apply topically as directed.   No facility-administered encounter medications on file as of 08/23/2016.      ONCOLOGIC FAMILY HISTORY:  Family History  Problem Relation Age of Onset  . Breast cancer Mother 55  . Colon cancer Mother 17  . Cervical cancer Mother 9    s/p hysterectomy  . Heart attack Father 58  . Diabetes Father     probable pre DM  . Skin cancer Father     maybe melanoma  . Cancer Sister 18    appendiceal  . Heart attack Maternal Grandfather 53  . Heart attack Paternal Uncle     X2 ; >50y  . Heart attack Maternal Uncle     d. 68  . Parkinson's disease Maternal Grandmother      d. 42  . Heart attack Paternal Grandfather   . Breast cancer Other     unspecified age; maternal great aunt (MGM's sister)  . Breast cancer Cousin     maternal 1st cousin, once-removed; unspecified age  . Heart attack Paternal Uncle     >50y  . Stroke Neg Hx     GENETIC COUNSELING/TESTING: Genetic testing was normal, and did not reveal a deleterious mutation in these genes. Additionally, no variants of uncertain significance (VUSes) were found.  Genes tested include:  ATM, BARD1, BRCA1, BRCA2, BRIP1, CDH1, CHEK2, FANCC, MLH1, MSH2, MSH6, NBN, PALB2, PMS2, PTEN, RAD51C, RAD51D, TP53, and XRCC2.  This panel also includes deletion/duplication analysis (without sequencing) for one gene, EPCAM.  Genetic testing updated in 12/2015.  SOCIAL HISTORY:  TANECIA MCCAY is married and lives in [place], [place].  She has 2 children and they live in [place].  Ms. Delconte is currently retired.  She denies any current or history of tobacco, alcohol, or illicit drug use.     PHYSICAL EXAMINATION:  Vital Signs: Vitals:   08/23/16 1322  BP: (!) 110/55  Pulse: 73  Resp: 18  Temp: 98.4 F (36.9 C)   Filed Weights   08/23/16 1322  Weight: 118 lb (53.5 kg)   General: Well-nourished, well-appearing female in no acute distress.  Unaccompanied today.   HEENT: Head is normocephalic.  Pupils equal and reactive to light. Conjunctivae clear without exudate.  Sclerae anicteric. Oral mucosa is pink, moist.  Oropharynx is pink without lesions or erythema.  Lymph: No cervical, supraclavicular, or infraclavicular lymphadenopathy noted on palpation.  Cardiovascular: Regular rate and rhythm.Marland Kitchen Respiratory: Clear to auscultation bilaterally. Chest expansion symmetric; breathing non-labored.  Breast Exam:  -s/p bilateral mastectomies and implant placement.  She has no skin changes, no swelling, nodularity, masses, or concern for recurrence -Axilla: No axillary adenopathy bilaterally.  GI:  Abdomen soft and round; non-tender, non-distended. Bowel sounds normoactive. No hepatosplenomegaly.   GU: Deferred.  Neuro: No focal deficits. Steady gait.  Psych: Mood and affect normal and appropriate for situation.  Extremities: No edema. Skin: Warm and dry.  LABORATORY DATA:  None for this visit   DIAGNOSTIC IMAGING:  Most recent mammogram: n/a    ASSESSMENT AND PLAN:  Ms.. Dearing is a pleasant 71 y.o. female with history of Stage IA left breast invasive ductal carcinoma, ER+/PR+/HER2-, and history of Stage IA right breast invasive ductal carcinoma, ER+/PR weakly +/HER2-, diagnosed in 2012, treated with bilateral mastectomies and adjuvant Letrozole starting in October, 2012.  She presents to the Survivorship Clinic for surveillance and routine follow-up.   1. History of breast cancer:  Ms. Wiehe is currently clinically and radiographically without evidence of disease or recurrence of breast cancer.  We reviewed breast cancer screening recommendations, and also surveillance for breast cancer in detail today.  I encouraged her to call me with any questions or concerns before her next visit at the cancer center, and I would be happy to see her sooner, if needed.    2. Bone health:  Given Ms. Deerman's age, history of breast cancer, and her previous anti-estrogen therapy with Letrozole, she is at risk for bone demineralization. Her last DEXA scan was on 11/08/2015 and demonstrated osteopenia.  She has stopped Letrozole since her last bone density testing.  In the meantime, she was encouraged to increase her consumption of foods rich in calcium, as well as increase her weight-bearing activities.  She was given education on specific food and activities to promote bone health.  She will be due for repeat bone density next summer.  3. Cancer screening:  Due to Ms. Cayson's history and her age, she should receive screening for skin cancers, colon cancer, and gynecologic cancers. She was encouraged to  follow-up with her PCP for appropriate cancer screenings.   4. Health maintenance and wellness promotion: Ms. Grayer was encouraged to consume 5-7 servings of fruits and vegetables per day. She was also encouraged to engage in moderate to vigorous exercise for 30 minutes per day most days of the week. She was instructed to limit her alcohol consumption and continue to abstain from tobacco use.    Dispo:  -Return to cancer center in one year for LTS follow up.     A  total of (30) minutes of face-to-face time was spent with this patient with greater than 50% of that time in counseling and care-coordination.   Gardenia Phlegm, NP Survivorship Program [facility] [phone]   Note: PRIMARY CARE PROVIDER Joycelyn Man, [place] [phone]

## 2016-09-06 ENCOUNTER — Other Ambulatory Visit: Payer: Self-pay | Admitting: Internal Medicine

## 2016-09-06 ENCOUNTER — Other Ambulatory Visit: Payer: Self-pay | Admitting: Family Medicine

## 2016-09-06 DIAGNOSIS — J4520 Mild intermittent asthma, uncomplicated: Secondary | ICD-10-CM

## 2016-09-06 DIAGNOSIS — R05 Cough: Secondary | ICD-10-CM

## 2016-09-06 DIAGNOSIS — R059 Cough, unspecified: Secondary | ICD-10-CM

## 2016-09-06 NOTE — Telephone Encounter (Signed)
Routing to patient's pcp to handle

## 2016-09-14 ENCOUNTER — Ambulatory Visit (INDEPENDENT_AMBULATORY_CARE_PROVIDER_SITE_OTHER): Payer: Medicare Other | Admitting: Family Medicine

## 2016-09-14 ENCOUNTER — Encounter: Payer: Self-pay | Admitting: Family Medicine

## 2016-09-14 VITALS — BP 110/62 | Temp 98.1°F | Wt 115.0 lb

## 2016-09-14 DIAGNOSIS — J441 Chronic obstructive pulmonary disease with (acute) exacerbation: Secondary | ICD-10-CM

## 2016-09-14 DIAGNOSIS — J45901 Unspecified asthma with (acute) exacerbation: Secondary | ICD-10-CM | POA: Diagnosis not present

## 2016-09-14 MED ORDER — HYDROCODONE-HOMATROPINE 5-1.5 MG/5ML PO SYRP
5.0000 mL | ORAL_SOLUTION | Freq: Three times a day (TID) | ORAL | 0 refills | Status: DC | PRN
Start: 2016-09-14 — End: 2017-08-03

## 2016-09-14 MED ORDER — PREDNISONE 20 MG PO TABS: ORAL_TABLET | ORAL | 0 refills | Status: DC

## 2016-09-14 NOTE — Patient Instructions (Signed)
Prednisone 20 mg......... 2 tabs daily until well then taper as outlined...........Marland Kitchen 1 tab for 5 days, half a tab for 5 days, then a half a tab Monday Wednesday Friday for a three-weeks   Stop the steroid nasal spray  Continue the Symbicort and the Singulair.

## 2016-09-14 NOTE — Progress Notes (Signed)
Courtney [name] is a 71 year old married female nonsmoker who comes in today with a week's history of head congestion postnasal drip cough some bleeding from her left nostril. She had this problem last winter and was sick for couple months. We ended up sending her to pulmonary. She's been on Symbicort 1 puff twice a day, steroid nasal spray twice a day, Singulair 10 mg daily and albuterol when necessary.  She had a good winter no major problems until the pollen started about 10 days ago. She restart her prednisone she took 2 a day for 3 days and is now doing a half. However symptoms of not abated.  BP 110/62 (BP Location: Left Arm)   Temp 98.1 F (36.7 C) (Oral)   Wt 115 lb (52.2 kg)   BMI 25.55 kg/m  General she is well-developed well-nourished female no acute distress vital signs stable she's afebrile HEENT were negative neck was supple thyroid is not enlarged. Pulmonary exam shows symmetrical breath sounds some very faint wheezing symmetrical on forced expiration  #1 reactive airway disease.........Marland Kitchen prednisone 40 mg daily until well then taper as outlined

## 2016-11-26 ENCOUNTER — Encounter (HOSPITAL_COMMUNITY): Payer: Self-pay

## 2017-01-04 ENCOUNTER — Encounter: Payer: Self-pay | Admitting: Internal Medicine

## 2017-01-10 ENCOUNTER — Encounter: Payer: Self-pay | Admitting: Family Medicine

## 2017-01-10 ENCOUNTER — Ambulatory Visit (INDEPENDENT_AMBULATORY_CARE_PROVIDER_SITE_OTHER): Payer: Medicare Other | Admitting: Family Medicine

## 2017-01-10 VITALS — BP 108/64 | HR 76 | Temp 97.9°F | Ht <= 58 in | Wt 113.0 lb

## 2017-01-10 DIAGNOSIS — Z0000 Encounter for general adult medical examination without abnormal findings: Secondary | ICD-10-CM

## 2017-01-10 DIAGNOSIS — Z23 Encounter for immunization: Secondary | ICD-10-CM

## 2017-01-10 DIAGNOSIS — R002 Palpitations: Secondary | ICD-10-CM

## 2017-01-10 DIAGNOSIS — Z170 Estrogen receptor positive status [ER+]: Secondary | ICD-10-CM

## 2017-01-10 DIAGNOSIS — I10 Essential (primary) hypertension: Secondary | ICD-10-CM | POA: Diagnosis not present

## 2017-01-10 DIAGNOSIS — C50911 Malignant neoplasm of unspecified site of right female breast: Secondary | ICD-10-CM | POA: Diagnosis not present

## 2017-01-10 DIAGNOSIS — R7301 Impaired fasting glucose: Secondary | ICD-10-CM | POA: Diagnosis not present

## 2017-01-10 DIAGNOSIS — K219 Gastro-esophageal reflux disease without esophagitis: Secondary | ICD-10-CM | POA: Diagnosis not present

## 2017-01-10 LAB — BASIC METABOLIC PANEL
BUN: 17 mg/dL (ref 6–23)
CALCIUM: 9.6 mg/dL (ref 8.4–10.5)
CO2: 27 mEq/L (ref 19–32)
CREATININE: 0.64 mg/dL (ref 0.40–1.20)
Chloride: 103 mEq/L (ref 96–112)
GFR: 96.66 mL/min (ref >60.00)
Glucose, Bld: 93 mg/dL (ref 70–99)
Potassium: 3.9 mEq/L (ref 3.5–5.1)
SODIUM: 142 meq/L (ref 135–145)

## 2017-01-10 LAB — HEPATIC FUNCTION PANEL
ALBUMIN: 4.6 g/dL (ref 3.5–5.2)
ALT: 34 U/L (ref 0–35)
AST: 25 U/L (ref 0–37)
Alkaline Phosphatase: 34 U/L — ABNORMAL LOW (ref 39–117)
Bilirubin, Direct: 0.1 mg/dL (ref 0.0–0.3)
TOTAL PROTEIN: 7 g/dL (ref 6.0–8.3)
Total Bilirubin: 0.6 mg/dL (ref 0.2–1.2)

## 2017-01-10 LAB — CBC WITH DIFFERENTIAL/PLATELET
BASOS ABS: 0.1 10*3/uL (ref 0.0–0.1)
BASOS PCT: 1.6 % (ref 0.0–3.0)
EOS ABS: 0.1 10*3/uL (ref 0.0–0.7)
Eosinophils Relative: 2.8 % (ref 0.0–5.0)
HCT: 40.7 % (ref 36.0–46.0)
HEMOGLOBIN: 13.4 g/dL (ref 12.0–15.0)
LYMPHS PCT: 37.9 % (ref 12.0–46.0)
Lymphs Abs: 1.5 10*3/uL (ref 0.7–4.0)
MCHC: 33 g/dL (ref 30.0–36.0)
MCV: 93.6 fl (ref 78.0–100.0)
MONO ABS: 0.3 10*3/uL (ref 0.1–1.0)
Monocytes Relative: 7 % (ref 3.0–12.0)
Neutro Abs: 2 10*3/uL (ref 1.4–7.7)
Neutrophils Relative %: 50.7 % (ref 43.0–77.0)
Platelets: 199 10*3/uL (ref 150.0–400.0)
RBC: 4.35 Mil/uL (ref 3.87–5.11)
RDW: 13.2 % (ref 11.5–15.5)
WBC: 3.9 10*3/uL — ABNORMAL LOW (ref 4.0–10.5)

## 2017-01-10 LAB — POCT URINALYSIS DIPSTICK
Bilirubin, UA: NEGATIVE
Glucose, UA: NEGATIVE
Ketones, UA: NEGATIVE
Leukocytes, UA: NEGATIVE
NITRITE UA: NEGATIVE
PROTEIN UA: NEGATIVE
RBC UA: NEGATIVE
UROBILINOGEN UA: 0.2 U/dL
pH, UA: 6 (ref 5.0–8.0)

## 2017-01-10 LAB — LIPID PANEL
CHOLESTEROL: 231 mg/dL — AB (ref 0–200)
HDL: 68.6 mg/dL (ref >39.00)
LDL CALC: 152 mg/dL — AB (ref 0–99)
NonHDL: 162.61
TRIGLYCERIDES: 52 mg/dL (ref 0.0–149.0)
Total CHOL/HDL Ratio: 3
VLDL: 10.4 mg/dL (ref 0.0–40.0)

## 2017-01-10 LAB — HEMOGLOBIN A1C: HEMOGLOBIN A1C: 5.8 % (ref 4.6–6.5)

## 2017-01-10 LAB — TSH: TSH: 1.18 u[IU]/mL (ref 0.35–4.50)

## 2017-01-10 MED ORDER — ALBUTEROL SULFATE HFA 108 (90 BASE) MCG/ACT IN AERS: 2.0000 | INHALATION_SPRAY | RESPIRATORY_TRACT | 2 refills | Status: DC | PRN

## 2017-01-10 MED ORDER — AMLODIPINE BESYLATE 5 MG PO TABS: ORAL_TABLET | ORAL | 4 refills | Status: DC

## 2017-01-10 MED ORDER — MONTELUKAST SODIUM 10 MG PO TABS: 10.0000 mg | ORAL_TABLET | Freq: Every day | ORAL | 4 refills | Status: DC

## 2017-01-10 MED ORDER — LOSARTAN POTASSIUM-HCTZ 100-12.5 MG PO TABS: 1.0000 | ORAL_TABLET | Freq: Every day | ORAL | 4 refills | Status: DC

## 2017-01-10 MED ORDER — FLUTICASONE PROPIONATE 50 MCG/ACT NA SUSP: 1.0000 | Freq: Every day | NASAL | 9 refills | Status: DC

## 2017-01-10 MED ORDER — OMEPRAZOLE 20 MG PO CPDR: DELAYED_RELEASE_CAPSULE | ORAL | 4 refills | Status: DC

## 2017-01-10 MED ORDER — BUDESONIDE-FORMOTEROL FUMARATE 160-4.5 MCG/ACT IN AERO: INHALATION_SPRAY | RESPIRATORY_TRACT | 4 refills | Status: DC

## 2017-01-10 NOTE — Patient Instructions (Signed)
Continue current medications  Call your insurance company to find out where he can get your shingles vaccine  You'll receive a call from cardiology to get set up for the event monitor. Once the monitors removed I will get a report and I will call you. It usually takes about a week.  Continue exercise program except no water aerobics while your on the monitor  At the first sign of any viral infection restart your pulmonary medications

## 2017-01-10 NOTE — Progress Notes (Signed)
Courtney [name] is a delightful 71 year old married female nonsmoker,,,,,,, retired Radio producer, who comes in today for general physical examination because of a history of breast cancer, hypertension, occasional asthma, allergic rhinitis, rosacea, reflux esophagitis.  In 2012 she was diagnosed of breast cancer and underwent bilateral mastectomy with subsequent reconstruction surgery. She's done well no complications. She sees oncology on a yearly basis.  She has a history of underlying hypertension is on Norvasc 5 mg daily along with Hyzaar 100-12 0.5 daily. BP 108/64. She says she is not lightheaded when she stands up  2 winters ago she had a very severe bout of asthma triggered by a viral infection. Taper 6 months to get well. We enlisted the help of pulmonary. The issue what is going forward should she do the Symbicort on a daily basis. She's reluctant to do that. She agrees to start at the first sign of any cold.  She does have underlying allergic rhinitis U steroid nasal spray  She takes Prilosec 20 mg daily because history of chronic reflux.  She uses a gel from her dermatologist for rosacea. She was on Retin-A but stopped that.  She gets routine eye care, dental care, and you mammography, colonoscopy 2013. She's due to go back this fall for for follow-up colonoscopy. Her mother had colon cancer.  Vaccination history she is up all all vaccines except she needs to get the new shingles vaccine.  She sees Dr. Para March on yearly basis for pelvic exams. She's had her uterus and ovaries removed  She sees Dr. Althia Forts in dermatology because his rosacea. She has history of varicose veins. We send her Dr. Rosaura Carpenter she had some injected.  14 point review of systems reviewed otherwise negative except for recent history of palpitations. She says she wakes up at night and feels like her heart fluttering. It occurs once or twice a week. When she stretches it it goes away. She has no chest pain shortness of  breath etc. She was diagnosed by chest x-ray of cardiomegaly actually because of her height her cardiac status is normal.  She exercises water aerobics 3 times a week the other days walks 45 minutes with no chest pain shortness of breath etc.  Cognitive function normal she exercises daily home health safety reviewed no issues identified, all the guns in the house or locked up in a safe, she does have a healthcare power of attorney and living well  BP 108/64 (BP Location: Left Arm, Patient Position: Sitting, Cuff Size: Normal)   Pulse 76   Temp 97.9 F (36.6 C) (Oral)   Ht 4\' 8"  (1.422 m)   Wt 113 lb (51.3 kg)   BMI 25.33 kg/m  Examination of the HEENT were negative neck was supple thyroid is not enlarged no carotid bruits cardiopulmonary exam normal breast exam both breasts the been removed. Reconstructive implants are palpable and no abnormalities. Abdominal exam negative pelvic by GYN therefore deferred extremities normal skin normal peripheral pulses normal except for some varicose veins.  #1 hypertension at goal.......... continue current therapy  #2 history of asthma secondary to viral infection........ only take medicine on a when necessary basis  #3 allergic rhinitis........ continue OTC antihistamines Singulair and steroid nasal spray  #4 history of reflux esophagitis continue Prilosec 20 mg daily  #5 rosacea......... continue follow-up by dermatology  #6 status post bilateral mastectomies with implant secondary to breast cancer 2012...Marland KitchenMarland KitchenMarland Kitchen continue annual surveillance with mammography and follow-up by oncology  #7 palpitations..........Marland Kitchen event monitor in cardiology for evaluation

## 2017-01-19 ENCOUNTER — Encounter: Payer: Self-pay | Admitting: Internal Medicine

## 2017-01-21 ENCOUNTER — Encounter: Payer: Self-pay | Admitting: Family Medicine

## 2017-02-28 DIAGNOSIS — R61 Generalized hyperhidrosis: Secondary | ICD-10-CM | POA: Insufficient documentation

## 2017-02-28 DIAGNOSIS — I10 Essential (primary) hypertension: Secondary | ICD-10-CM | POA: Insufficient documentation

## 2017-02-28 DIAGNOSIS — K5790 Diverticulosis of intestine, part unspecified, without perforation or abscess without bleeding: Secondary | ICD-10-CM | POA: Insufficient documentation

## 2017-03-11 ENCOUNTER — Encounter: Payer: Medicare Other | Admitting: Internal Medicine

## 2017-03-12 ENCOUNTER — Other Ambulatory Visit: Payer: Self-pay | Admitting: Nurse Practitioner

## 2017-03-13 ENCOUNTER — Other Ambulatory Visit: Payer: Self-pay | Admitting: Family Medicine

## 2017-03-15 ENCOUNTER — Encounter: Payer: Self-pay | Admitting: Cardiovascular Disease

## 2017-03-15 ENCOUNTER — Ambulatory Visit: Payer: Medicare Other | Admitting: Cardiovascular Disease

## 2017-03-15 ENCOUNTER — Telehealth: Payer: Self-pay | Admitting: *Deleted

## 2017-03-15 VITALS — BP 122/62 | HR 70 | Ht <= 58 in | Wt 117.0 lb

## 2017-03-15 DIAGNOSIS — I10 Essential (primary) hypertension: Secondary | ICD-10-CM | POA: Diagnosis not present

## 2017-03-15 DIAGNOSIS — G4730 Sleep apnea, unspecified: Secondary | ICD-10-CM

## 2017-03-15 NOTE — Progress Notes (Signed)
Cardiology Office Note   Date:  03/15/2017   ID:  Courtney [name], DOB 1945/12/11, MRN [id number]  PCP:  Dorena Cookey, MD  Cardiologist:   Mertie Moores, MD   Chief Complaint  Patient presents with  . Follow-up    cardiomegaly    Problem List 1. Ashtma 2 Cardiogeglay  3. Essential HTN 4. Breast cancer - 2011   History of Present Illness: Courtney [name] is a 71 y.o. female who presents for further evaluation of cardiomegaly that was noticed on CXR. She has asthma / reactive airways disease  Able to do all of her normal activities   Nov. 13, 2018:  Courtney [name] is seen today for follow up of her cardiomegaly.  She had an echocardiogram after her last visit which showed normal left ventricular size and function.  She does have mild diastolic dysfunction.  She had normal left ventricular wall thickness and left ventricular size.  Recently she has noticed that she has palpitations at night when she wakes up.  Typically occurs when she wakes up suddenly.   These resolve if she takes several deep breaths and stretches.  She is able to get back to sleep without too much difficulty.  Her husband states that she does snore at night.    Husband states that she also has some apneic episodes.  Exercises - water aeorbic 4 times a week, walks 2 miles 3 times a week.   Past Medical History:  Diagnosis Date  . Breast cancer [facility]) July 2012   post double mastectomy; Dr Humphrey Rolls  . Diverticulosis   . Family history of breast cancer   . GERD (gastroesophageal reflux disease)    S/P dilation X 1  . Hypertension   . Night sweats   . Reactive airway disease     Past Surgical History:  Procedure Laterality Date  . ABDOMINAL HYSTERECTOMY    . bilateral breast reconstruction  Jan 2013  . BREAST SURGERY  01/13/11   bilateral total mastectomy; Dr Dalbert Batman  . COLONOSCOPY  2014   Dr Olevia Perches; hyperplastic polyp  . ESOPHAGEAL DILATION  2014  . NASAL SEPTUM SURGERY    . RECONSTRUCTION / CORRECTION OF  NIPPLE / Tresa Moore  June 2013  . THYROGLOSSAL DUCT CYST       Current Outpatient Medications  Medication Sig Dispense Refill  . albuterol (PROAIR HFA) 108 (90 Base) MCG/ACT inhaler Inhale 2 puffs into the lungs every 4 (four) hours as needed for wheezing or shortness of breath. 1 Inhaler 2  . amLODipine (NORVASC) 5 MG tablet TAKE 1 TABLET(5 MG) BY MOUTH DAILY 90 tablet 4  . Ascorbic Acid (VITAMIN C) 1000 MG tablet Take 1,000 mg by mouth daily.    Marland Kitchen aspirin 81 MG tablet Take 81 mg by mouth daily.      . budesonide-formoterol (SYMBICORT) 160-4.5 MCG/ACT inhaler INHALE 1 TO 2 PUFFS EVERY 12 HOURS AS NEEDED. GARGLE AND SPIT AFTER USE 10.2 g 4  . calcium citrate-vitamin D (CITRACAL+D) 315-200 MG-UNIT per tablet Take 1 tablet by mouth daily.    . Cholecalciferol (VITAMIN D3) 1000 UNITS CAPS Take by mouth daily.      Marland Kitchen co-enzyme Q-10 30 MG capsule Take 50 mg by mouth daily.     Marland Kitchen FINACEA 15 % cream Apply 1 application topically 2 (two) times daily. For rosacea  0  . fluticasone (FLONASE) 50 MCG/ACT nasal spray Place 1 spray into both nostrils daily. 16 g 9  . Ginseng (ELEUTHERO PO) Take  by mouth as directed.    . Glucosamine Sulfate 500 MG CAPS Take 3 capsules by mouth daily.    Marland Kitchen HYDROcodone-homatropine (HYCODAN) 5-1.5 MG/5ML syrup Take 5 mLs by mouth every 8 (eight) hours as needed. 240 mL 0  . losartan-hydrochlorothiazide (HYZAAR) 100-12.5 MG tablet Take 1 tablet by mouth daily. 90 tablet 4  . losartan-hydrochlorothiazide (HYZAAR) 100-12.5 MG tablet TAKE 1 TABLET BY MOUTH DAILY 90 tablet 4  . Misc Natural Products (TART CHERRY ADVANCED PO) Take 2,400 mg by mouth daily.    . montelukast (SINGULAIR) 10 MG tablet Take 1 tablet (10 mg total) by mouth at bedtime. 100 tablet 4  . Multiple Vitamins-Minerals (MULTIVITAMIN WITH MINERALS) tablet Take 2 tablets by mouth daily.      . OMEGA 3 1000 MG CAPS Take 1,000 mg by mouth daily.      Marland Kitchen omeprazole (PRILOSEC) 20 MG capsule TAKE 1 CAPSULE BY MOUTH TWICE  DAILY BEFORE A MEAL 180 capsule 4  . psyllium (METAMUCIL) 58.6 % powder Take 1 packet by mouth daily.    Marland Kitchen tretinoin (RETIN-A) 0.025 % cream Apply topically at bedtime.     No current facility-administered medications for this visit.     Allergies:   Patient has no known allergies.    Social History:  The patient  reports that she quit smoking about 51 years ago. Her smoking use included cigarettes. She quit after 5.00 years of use. she has never used smokeless tobacco. She reports that she drinks about 8.4 oz of alcohol per week. She reports that she does not use drugs.   Family History:  The patient's family history includes Breast cancer in her cousin and other; Breast cancer (age of onset: 18) in her mother; Cancer (age of onset: 33) in her sister; Cervical cancer (age of onset: 8) in her mother; Colon cancer (age of onset: 36) in her mother; Diabetes in her father; Heart attack in her maternal uncle, paternal grandfather, paternal uncle, and paternal uncle; Heart attack (age of onset: 43) in her maternal grandfather; Heart attack (age of onset: 66) in her father; Parkinson's disease in her maternal grandmother; Skin cancer in her father.    ROS:  Please see the history of present illness.   As noted in the current history.  All other systems are negative.   Physical Exam: Blood pressure 122/62, pulse 70, height 4\' 9"  (1.448 m), weight 117 lb (53.1 kg), SpO2 97 %.  GEN:  Well nourished, well developed in no acute distress HEENT: Normal NECK: No JVD; No carotid bruits LYMPHATICS: No lymphadenopathy CARDIAC: RR, no murmurs, rubs, gallops RESPIRATORY:  Clear to auscultation without rales, wheezing or rhonchi  ABDOMEN: Soft, non-tender, non-distended MUSCULOSKELETAL:  No edema; No deformity  SKIN: Warm and dry NEUROLOGIC:  Alert and oriented x 3   EKG:  EKG is not ordered today. The ekg ordered 07/29/15  demonstrates NSR with no ST changes    Recent Labs: 01/10/2017: ALT 34;  BUN 17; Creatinine, Ser 0.64; Hemoglobin 13.4; Platelets 199.0; Potassium 3.9; Sodium 142; TSH 1.18    Lipid Panel    Component Value Date/Time   CHOL 231 (H) 01/10/2017 1200   TRIG 52.0 01/10/2017 1200   HDL 68.60 01/10/2017 1200   CHOLHDL 3 01/10/2017 1200   VLDL 10.4 01/10/2017 1200   LDLCALC 152 (H) 01/10/2017 1200   LDLDIRECT 131.3 02/29/2012 1043      Wt Readings from Last 3 Encounters:  03/15/17 117 lb (53.1 kg)  01/10/17 113 lb (  51.3 kg)  09/14/16 115 lb (52.2 kg)      Other studies Reviewed: Additional studies/ records that were reviewed today include: . Review of the above records demonstrates:    ASSESSMENT AND PLAN:  1.  Cardiomegaly -   echocardiogram confirms that she does not have cardiomegaly.   she does have normal left ventricular systolic function and grade 1 diastolic dysfunction.  Her blood pressure is well controlled.  Is being managed by her primary medical doctor.  2.  Possible sleep apnea: She presents today stating that she wakes up with palpitations on occasion.  Her husband says that she snores and that occasionally she will quit breathing.  We will schedule her for a split-night sleep study.  She is requested that Dr. Brett Fairy read her study.  Current medicines are reviewed at length with the patient today.  The patient does not have concerns regarding medicines.  The following changes have been made:  no change  Labs/ tests ordered today include:  No orders of the defined types were placed in this encounter.    Disposition:   FU with me as needed.     Mertie Moores, MD  03/15/2017 9:34 AM    [address], [place], [place]  [id number] Phone: [phone]; Fax: [phone]

## 2017-03-15 NOTE — Patient Instructions (Addendum)
Medication Instructions:  Your physician recommends that you continue on your current medications as directed. Please refer to the Current Medication list given to you today.   Labwork: None Ordered   Testing/Procedures: Your physician has recommended that you have a sleep study. This test records several body functions during sleep, including: brain activity, eye movement, oxygen and carbon dioxide blood levels, heart rate and rhythm, breathing rate and rhythm, the flow of air through your mouth and nose, snoring, body muscle movements, and chest and belly movement.   Follow-Up: Your physician recommends that you schedule a follow-up appointment in: as needed with Dr. Nahser   If you need a refill on your cardiac medications before your next appointment, please call your pharmacy.   Thank you for choosing [facility]! Elizabth Palka, RN [phone]    

## 2017-03-15 NOTE — Telephone Encounter (Signed)
-----   Message from Emmaline Life, RN sent at 03/15/2017  9:58 AM EST ----- Please schedule Dr. Elmarie Shiley patient for a sleep study. Her Epworth score is 2 but her husband reports periods of apnea during sleep.  Please send results of sleep study to Dr. Acie Fredrickson.  Thank you, Sharyn Lull

## 2017-03-28 ENCOUNTER — Other Ambulatory Visit: Payer: Self-pay

## 2017-03-28 ENCOUNTER — Ambulatory Visit (AMBULATORY_SURGERY_CENTER): Payer: Self-pay | Admitting: *Deleted

## 2017-03-28 VITALS — Ht <= 58 in | Wt 117.0 lb

## 2017-03-28 DIAGNOSIS — Z800 Family history of malignant neoplasm of digestive organs: Secondary | ICD-10-CM

## 2017-03-28 NOTE — Progress Notes (Signed)
Patient denies any allergies to eggs or soy. Patient denies any problems with anesthesia. Patient states after last EGD (2014 w/Dr.Brodie under MAC sedation) she started shaking very bad at home and came back to office to get evaluated. Patient denies any oxygen use at home. Patient denies taking any diet/weight loss medications or blood thinners. EMMI education declined by patient.

## 2017-03-29 ENCOUNTER — Encounter: Payer: Self-pay | Admitting: Internal Medicine

## 2017-04-01 ENCOUNTER — Encounter: Payer: Medicare Other | Admitting: Internal Medicine

## 2017-04-03 ENCOUNTER — Other Ambulatory Visit: Payer: Self-pay | Admitting: Family Medicine

## 2017-04-08 ENCOUNTER — Encounter: Payer: Self-pay | Admitting: Internal Medicine

## 2017-04-08 ENCOUNTER — Ambulatory Visit (AMBULATORY_SURGERY_CENTER): Payer: Medicare Other | Admitting: Internal Medicine

## 2017-04-08 VITALS — BP 131/69 | HR 68 | Temp 97.5°F | Resp 21 | Ht <= 58 in | Wt 117.0 lb

## 2017-04-08 DIAGNOSIS — Z800 Family history of malignant neoplasm of digestive organs: Secondary | ICD-10-CM | POA: Diagnosis present

## 2017-04-08 DIAGNOSIS — Z1211 Encounter for screening for malignant neoplasm of colon: Secondary | ICD-10-CM | POA: Diagnosis not present

## 2017-04-08 MED ORDER — SODIUM CHLORIDE 0.9 % IV SOLN: 500.0000 mL | Freq: Once | INTRAVENOUS | Status: DC

## 2017-04-08 NOTE — Progress Notes (Signed)
Report to PACU, RN, vss, BBS= Clear.  

## 2017-04-08 NOTE — Patient Instructions (Addendum)
   YOU HAD AN ENDOSCOPIC PROCEDURE TODAY AT [facility]:   Refer to the procedure report that was given to you for any specific questions about what was found during the examination.  If the procedure report does not answer your questions, please call your gastroenterologist to clarify.  If you requested that your care partner not be given the details of your procedure findings, then the procedure report has been included in a sealed envelope for you to review at your convenience later.  YOU SHOULD EXPECT: Some feelings of bloating in the abdomen. Passage of more gas than usual.  Walking can help get rid of the air that was put into your GI tract during the procedure and reduce the bloating. If you had a lower endoscopy (such as a colonoscopy or flexible sigmoidoscopy) you may notice spotting of blood in your stool or on the toilet paper. If you underwent a bowel prep for your procedure, you may not have a normal bowel movement for a few days.  Please Note:  You might notice some irritation and congestion in your nose or some drainage.  This is from the oxygen used during your procedure.  There is no need for concern and it should clear up in a day or so.  SYMPTOMS TO REPORT IMMEDIATELY:   Following lower endoscopy (colonoscopy or flexible sigmoidoscopy):  Excessive amounts of blood in the stool  Significant tenderness or worsening of abdominal pains  Swelling of the abdomen that is new, acute  Fever of 100F or higher  For urgent or emergent issues, a gastroenterologist can be reached at any hour by calling [phone].   DIET:  We do recommend a small meal at first, but then you may proceed to your regular diet.  Drink plenty of fluids but you should avoid alcoholic beverages for 24 hours.  ACTIVITY:  You should plan to take it easy for the rest of today and you should NOT DRIVE or use heavy machinery until tomorrow (because of the sedation medicines used during the test).     FOLLOW UP: Our staff will call the number listed on your records the next business day following your procedure to check on you and address any questions or concerns that you may have regarding the information given to you following your procedure. If we do not reach you, we will leave a message.  However, if you are feeling well and you are not experiencing any problems, there is no need to return our call.  We will assume that you have returned to your regular daily activities without incident.  If any biopsies were taken you will be contacted by phone or by letter within the next 1-3 weeks.  Please call us at [phone] if you have not heard about the biopsies in 3 weeks. on your After Visit Summary has been reviewed and is understood.  Full responsibility of the confidentiality of this discharge information lies with you and/or your care-partner.  Diverticulosis, hemorrhoid and hemorrhoid banding information given.

## 2017-04-08 NOTE — Op Note (Signed)
[facility] Patient Name: Courtney [name] Procedure Date: 04/08/2017 2:19 PM MRN: [id number] Endoscopist: Gatha Mayer , MD Age: 71 Referring MD:  Date of Birth: [birth year] Gender: Female Account #: [id number] Procedure:                Colonoscopy Indications:              Screening in patient at increased risk: Colorectal                            cancer in mother 40 or older, Screening in patient                            at increased risk: Colorectal cancer in sister                            before age 27 Medicines:                Propofol per Anesthesia, Monitored Anesthesia Care Procedure:                Pre-Anesthesia Assessment:                           - Prior to the procedure, a History and Physical                            was performed, and patient medications and                            allergies were reviewed. The patient's tolerance of                            previous anesthesia was also reviewed. The risks                            and benefits of the procedure and the sedation                            options and risks were discussed with the patient.                            All questions were answered, and informed consent                            was obtained. Prior Anticoagulants: The patient has                            taken no previous anticoagulant or antiplatelet                            agents. ASA Grade Assessment: II - A patient with                            mild systemic disease. After reviewing the risks  and benefits, the patient was deemed in                            satisfactory condition to undergo the procedure.                           After obtaining informed consent, the colonoscope                            was passed under direct vision. Throughout the                            procedure, the patient's blood pressure, pulse, and                            oxygen saturations were  monitored continuously. The                            Colonoscope was introduced through the anus and                            advanced to the the cecum, identified by                            appendiceal orifice and ileocecal valve. The                            colonoscopy was performed without difficulty. The                            patient tolerated the procedure well. The quality                            of the bowel preparation was good. The ileocecal                            valve, appendiceal orifice, and rectum were                            photographed. The bowel preparation used was                            Miralax. Scope In: 2:29:12 PM Scope Out: 2:43:57 PM Scope Withdrawal Time: 0 hours 9 minutes 26 seconds  Total Procedure Duration: 0 hours 14 minutes 45 seconds  Findings:                 The perianal and digital rectal examinations were                            normal.                           Many small and large-mouthed diverticula were found  in the entire colon.                           Internal hemorrhoids were found.                           The exam was otherwise without abnormality on                            direct and retroflexion views. Complications:            No immediate complications. Estimated Blood Loss:     Estimated blood loss: none. Impression:               - Severe diverticulosis in the entire examined                            colon.                           - Internal hemorrhoids.                           - The examination was otherwise normal on direct                            and retroflexion views.                           - No specimens collected.                           - FHx appendiceal cancer sister and colon cancer                            mother Recommendation:           - Patient has a contact number available for                            emergencies. The signs and symptoms  of potential                            delayed complications were discussed with the                            patient. Return to normal activities tomorrow.                            Written discharge instructions were provided to the                            patient.                           - Resume previous diet.                           - Continue present  medications.                           - Repeat colonoscopy in 5 years for screening                            purposes.                           - we will contact her to schedule office visit to                            consider hemorrhoid banding Gatha Mayer, MD 04/08/2017 2:53:58 PM This report has been signed electronically.

## 2017-04-13 ENCOUNTER — Telehealth: Payer: Self-pay | Admitting: *Deleted

## 2017-04-13 NOTE — Telephone Encounter (Signed)
  Follow up Call-  Call back number 04/08/2017  Post procedure Call Back phone  # [phone]  Permission to leave phone message Yes  Some recent data might be hidden     Patient questions:  Do you have a fever, pain , or abdominal swelling? No. Pain Score  0 *  Have you tolerated food without any problems? Yes.    Have you been able to return to your normal activities? Yes.    Do you have any questions about your discharge instructions: Diet   No. Medications  No. Follow up visit  No.  Do you have questions or concerns about your Care? No.  Actions: * If pain score is 4 or above: No action needed, pain <4.

## 2017-04-19 NOTE — Telephone Encounter (Signed)
Informed patient of upcoming sleep study and patient understanding was verbalized. Patient understands her sleep study is scheduled for Monday May 23 2017. Patient understands her sleep study will be done at [facility] sleep lab. Patient understands she will receive a sleep packet in a week or so. Patient understands to call if she does not receive the sleep packet in a timely manner. Patient agrees with treatment and thanked me for call.

## 2017-05-03 HISTORY — PX: HEMORRHOID BANDING: SHX5850

## 2017-05-23 ENCOUNTER — Ambulatory Visit (HOSPITAL_BASED_OUTPATIENT_CLINIC_OR_DEPARTMENT_OTHER): Payer: Medicare Other | Attending: Cardiovascular Disease | Admitting: Cardiology

## 2017-05-23 VITALS — Ht <= 58 in | Wt 112.0 lb

## 2017-05-23 DIAGNOSIS — G4730 Sleep apnea, unspecified: Secondary | ICD-10-CM

## 2017-05-23 DIAGNOSIS — I10 Essential (primary) hypertension: Secondary | ICD-10-CM | POA: Diagnosis present

## 2017-05-23 DIAGNOSIS — G4733 Obstructive sleep apnea (adult) (pediatric): Secondary | ICD-10-CM | POA: Insufficient documentation

## 2017-05-24 NOTE — Procedures (Signed)
   NAME: Courtney [name] DATE OF BIRTH:  October 09, 1945 MEDICAL RECORD NUMBER [id number]  LOCATION: [facility]  PHYSICIAN: Traci Turner  DATE OF STUDY: 05/23/2017  SLEEP STUDY TYPE: Nocturnal Polysomnogram               REFERRING PHYSICIAN: Nahser, Wonda Cheng, MD   CLINICAL INFORMATION Sleep Study Type: NPSG  Indication for sleep study: Hypertension  Epworth Sleepiness Score: 2  SLEEP STUDY TECHNIQUE As per the AASM Manual for the Scoring of Sleep and Associated Events v2.3 (April 2016) with a hypopnea requiring 4% desaturations.  The channels recorded and monitored were frontal, central and occipital EEG, electrooculogram (EOG), submentalis EMG (chin), nasal and oral airflow, thoracic and abdominal wall motion, anterior tibialis EMG, snore microphone, electrocardiogram, and pulse oximetry.  MEDICATIONS Medications self-administered by patient taken the night of the study : NORVASC, HYZAAR, SINGULAIR, [place] The study was initiated at 10:21:02 PM and ended at 4:21:40 AM.  Sleep onset time was 24.3 minutes and the sleep efficiency was 71.5%. The total sleep time was 258.0 minutes.  Stage REM latency was 132.5 minutes.  The patient spent 4.84% of the night in stage N1 sleep, 54.46% in stage N2 sleep, 8.14% in stage N3 and 32.56% in REM.  Alpha intrusion was absent.  Supine sleep was 18.22%.  RESPIRATORY PARAMETERS The overall apnea/hypopnea index (AHI) was 7.2 per hour. There were 3 total apneas, including 2 obstructive, 1 central and 0 mixed apneas. There were 28 hypopneas and 14 RERAs.  The AHI during Stage REM sleep was 10.0 per hour.  AHI while supine was 12.8 per hour.  The mean oxygen saturation was 92.77%. The minimum SpO2 during sleep was 82.00%.  soft snoring was noted during this study.  CARDIAC DATA The 2 lead EKG demonstrated sinus rhythm. The mean heart rate was 68.88 beats per minute. Other EKG findings include:  None.  LEG MOVEMENT DATA The total PLMS were 0 with a resulting PLMS index of 0.00. Associated arousal with leg movement index was 0.0 .  IMPRESSIONS - Mild obstructive sleep apnea occurred during this study (AHI = 7.2/h). - No significant central sleep apnea occurred during this study (CAI = 0.2/h). - Oxygen desaturation was noted during this study (Min O2 = 82.00%). - The patient snored with soft snoring volume. - No cardiac abnormalities were noted during this study. - Clinically significant periodic limb movements did not occur during sleep. No significant associated arousals.  DIAGNOSIS - Obstructive Sleep Apnea (327.23 [G47.33 ICD-10])  RECOMMENDATIONS - Very mild obstructive sleep apnea. Return to discuss treatment options. - Avoid alcohol, sedatives and other CNS depressants that may worsen sleep apnea and disrupt normal sleep architecture. - Sleep hygiene should be reviewed to assess factors that may improve sleep quality. - Weight management and regular exercise should be initiated or continued if appropriate.  [place], American Board of Sleep Medicine  ELECTRONICALLY SIGNED ON:  05/24/2017, 8:19 PM [place] PH: [phone]   FX: [phone] [place]

## 2017-06-08 ENCOUNTER — Telehealth: Payer: Self-pay | Admitting: *Deleted

## 2017-06-08 NOTE — Telephone Encounter (Addendum)
Informed patient of sleep study results and patient understanding was verbalized. Patient understands she has very mild sleep apnea and Dr Radford Pax recommends OV to discuss. Patient has an appointment scheduled for August 03 2017 at 11:00. Patient agrees with treatment and thanked me for calling.

## 2017-06-08 NOTE — Telephone Encounter (Signed)
-----   Message from Sueanne Margarita, MD sent at 05/24/2017  8:22 PM EST ----- Please let patient know that they have very mild sleep apnea and recommend OV to discuss

## 2017-06-21 ENCOUNTER — Encounter: Payer: Self-pay | Admitting: Physician Assistant

## 2017-06-21 ENCOUNTER — Ambulatory Visit: Payer: Medicare Other | Admitting: Physician Assistant

## 2017-06-21 VITALS — BP 122/80 | HR 74 | Ht <= 58 in | Wt 118.5 lb

## 2017-06-21 DIAGNOSIS — K642 Third degree hemorrhoids: Secondary | ICD-10-CM

## 2017-06-21 DIAGNOSIS — Z800 Family history of malignant neoplasm of digestive organs: Secondary | ICD-10-CM | POA: Diagnosis not present

## 2017-06-21 DIAGNOSIS — K5730 Diverticulosis of large intestine without perforation or abscess without bleeding: Secondary | ICD-10-CM | POA: Diagnosis not present

## 2017-06-21 NOTE — Patient Instructions (Signed)
We made you an appointment for your first Hemorrhoidal banding.  Date:  06-27-2017. At 3:15 PM.  Hemorrhoid banding brochure provided.  Hold aspirin  3 days prior to that date.

## 2017-06-21 NOTE — Progress Notes (Addendum)
Subjective:    Patient ID: Courtney [name], female    DOB: May 10, 1945, 73 y.o.   MRN: [id number]  HPI Courtney [name] is a pleasant 72 year old white female, known to Dr. Carlean Purl. Appointment was made today to discuss hemorrhoidal banding. She underwent colonoscopy with Dr. Carlean Purl in December 2018 with finding of severe pandiverticulosis, no polyps and had internal hemorrhoids documented. Patient has family history of colon cancer, and personal history of colon polyps. She has also had breast cancer, has chronic GERD and hypertension. She would like definitive therapy for her internal hemorrhoids. She says she's been dealing with them for a long time and symptoms have gradually worsened. She is bothered by fairly regular low volume hematochezia, and some fecal soilage. She also has a difficult time cleaning after bowel movements. She feels that her hemorrhoids prolapse on a regular basis and require manual reduction. She is not having any current problems with her bowels, denies any significant constipation  Review of Systems Pertinent positive and negative review of systems were noted in the above HPI section.  All other review of systems was otherwise negative.  Outpatient Encounter Medications as of 06/21/2017  Medication Sig  . albuterol (PROAIR HFA) 108 (90 Base) MCG/ACT inhaler Inhale 2 puffs into the lungs every 4 (four) hours as needed for wheezing or shortness of breath.  Marland Kitchen amLODipine (NORVASC) 5 MG tablet TAKE 1 TABLET(5 MG) BY MOUTH DAILY  . Ascorbic Acid (VITAMIN C) 1000 MG tablet Take 1,000 mg by mouth daily.  Marland Kitchen aspirin 81 MG tablet Take 81 mg by mouth daily.    . budesonide-formoterol (SYMBICORT) 160-4.5 MCG/ACT inhaler INHALE 1 TO 2 PUFFS EVERY 12 HOURS AS NEEDED. GARGLE AND SPIT AFTER USE  . calcium citrate-vitamin D (CITRACAL+D) 315-200 MG-UNIT per tablet Take 1 tablet by mouth daily.  . Cholecalciferol (VITAMIN D3) 1000 UNITS CAPS Take by mouth daily.    . clobetasol (TEMOVATE) 0.05 %  external solution Apply 1 application topically at bedtime as needed (1-2 weeks).  Marland Kitchen co-enzyme Q-10 30 MG capsule Take 50 mg by mouth daily.   Marland Kitchen FINACEA 15 % cream Apply 1 application topically 2 (two) times daily. For rosacea  . fluticasone (FLONASE) 50 MCG/ACT nasal spray Place 1 spray into both nostrils daily.  . Ginseng (ELEUTHERO PO) Take by mouth as directed.  . Glucosamine Sulfate 500 MG CAPS Take 3 capsules by mouth daily.  Marland Kitchen HYDROcodone-homatropine (HYCODAN) 5-1.5 MG/5ML syrup Take 5 mLs by mouth every 8 (eight) hours as needed.  Marland Kitchen losartan-hydrochlorothiazide (HYZAAR) 100-12.5 MG tablet TAKE 1 TABLET BY MOUTH DAILY  . Misc Natural Products (TART CHERRY ADVANCED PO) Take 2,400 mg by mouth daily.  . montelukast (SINGULAIR) 10 MG tablet Take 1 tablet (10 mg total) by mouth at bedtime.  . Multiple Vitamins-Minerals (MULTIVITAMIN WITH MINERALS) tablet Take 2 tablets by mouth daily.    . OMEGA 3 1000 MG CAPS Take 1,000 mg by mouth daily.    Marland Kitchen omeprazole (PRILOSEC) 20 MG capsule TAKE 1 CAPSULE BY MOUTH TWICE DAILY BEFORE A MEAL  . psyllium (METAMUCIL) 58.6 % powder Take 1 packet by mouth daily.  Marland Kitchen tretinoin (RETIN-A) 0.025 % cream Apply topically at bedtime.   Facility-Administered Encounter Medications as of 06/21/2017  Medication  . 0.9 %  sodium chloride infusion   No Known Allergies Patient Active Problem List   Diagnosis Date Noted  . Family history of breast cancer   . Diverticulosis   . Reactive airway disease   . Night sweats   .  Hypertension   . Intermittent palpitations 01/10/2017  . Routine general medical examination at a health care facility 01/06/2016  . GERD (gastroesophageal reflux disease) 07/25/2015  . Varicose veins of bilateral lower extremities with other complications 81/82/9937  . Arthritis of left lower extremity 05/27/2014  . 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 Breast cancer, right breast ([place]) 12/25/2013  . Breast cancer, left breast ([place]) 12/25/2013    . Fasting hyperglycemia 02/29/2012  . Osteopenia 02/13/2012  . HTN (hypertension) 04/06/2011  . Breast cancer ([place]) 11/01/2010  . COLONIC POLYPS, HX OF 01/14/2010  . DIVERTICULOSIS, COLON 01/25/2007  . Dysphagia 01/25/2007   Social History   Socioeconomic History  . Marital status: Married    Spouse name: Not on file  . Number of children: Not on file  . Years of education: Not on file  . Highest education level: Not on file  Social Needs  . Financial resource strain: Not on file  . Food insecurity - worry: Not on file  . Food insecurity - inability: Not on file  . Transportation needs - medical: Not on file  . Transportation needs - non-medical: Not on file  Occupational History  . Not on file  Tobacco Use  . Smoking status: Former Smoker    Years: 5.00    Types: Cigarettes    Last attempt to quit: 05/03/1965    Years since quitting: 52.1  . Smokeless tobacco: Never Used  . Tobacco comment: smoked [phone], less than 1 ppd  Substance and Sexual Activity  . Alcohol use: Yes    Alcohol/week: 8.4 oz    Types: 14 Glasses of wine per week    Comment: 2-3 /day  . Drug use: No  . Sexual activity: Yes  Other Topics Concern  . Not on file  Social History Narrative  . Not on file    [name]'s family history includes Breast cancer in her cousin and other; Breast cancer (age of onset: 73) in her mother; Cancer (age of onset: 34) in her sister; Cervical cancer (age of onset: 28) in her mother; Colon cancer (age of onset: 27) in her mother; Diabetes in her father; Heart attack in her maternal uncle, paternal grandfather, paternal uncle, and paternal uncle; Heart attack (age of onset: 60) in her maternal grandfather; Heart attack (age of onset: 57) in her father; Parkinson's disease in her maternal grandmother; Skin cancer in her father.      Objective:    Vitals:   06/21/17 1036  BP: 122/80  Pulse: 74    Physical Exam well-developed older white female in no acute  distress, pleasant blood pressure 122/80 pulse 74, height 4 foot 9, weight 118, BMI 25.6. HEENT nontraumatic, cephalic EOMI PERRLA sclera anicteric, Not further examined today discussion only       Assessment & Plan:   #71 72 year old white female with long-standing symptomatic grade 3 internal hemorrhoids with frequent rectal bleeding, prolapsed and intermittent fecal soilage. #2 Pan diverticulosis #3 positive family history of colon cancer in patient's parents #4 personal history of adenomatous colon polyps remote #5 history of breast cancer  Plan; hemorrhoidal banding procedure was discussed with the patient in detail, including very small but real risk of postprocedure hemorrhage or infection. She wishes to proceed with banding and will be scheduled for initial banding session with Dr. Carlean Purl.  Dewey Neukam S Clayborn Milnes PA-C 06/21/2017   Cc: Dorena Cookey, MD   Agree with Ms. Genia Harold assessment and plan. Gatha Mayer, MD, [facility]

## 2017-06-22 ENCOUNTER — Ambulatory Visit: Payer: Medicare Other | Admitting: Family Medicine

## 2017-06-22 ENCOUNTER — Encounter: Payer: Self-pay | Admitting: Family Medicine

## 2017-06-22 VITALS — BP 126/64 | HR 64 | Temp 99.1°F | Wt 115.0 lb

## 2017-06-22 DIAGNOSIS — J4521 Mild intermittent asthma with (acute) exacerbation: Secondary | ICD-10-CM | POA: Diagnosis not present

## 2017-06-22 DIAGNOSIS — R062 Wheezing: Secondary | ICD-10-CM | POA: Insufficient documentation

## 2017-06-22 MED ORDER — PREDNISONE 20 MG PO TABS
ORAL_TABLET | ORAL | 1 refills | Status: DC
Start: 2017-06-22 — End: 2017-08-30

## 2017-06-22 MED ORDER — ALBUTEROL SULFATE (2.5 MG/3ML) 0.083% IN NEBU: 2.5000 mg | INHALATION_SOLUTION | Freq: Four times a day (QID) | RESPIRATORY_TRACT | 1 refills | Status: DC | PRN

## 2017-06-22 MED ORDER — AZITHROMYCIN 250 MG PO TABS: ORAL_TABLET | ORAL | 1 refills | Status: DC

## 2017-06-22 MED ORDER — ALBUTEROL SULFATE (2.5 MG/3ML) 0.083% IN NEBU
2.5000 mg | INHALATION_SOLUTION | Freq: Once | RESPIRATORY_TRACT | Status: DC
Start: 2017-06-22 — End: 2021-11-18

## 2017-06-22 NOTE — Patient Instructions (Signed)
Prednisone 20 mg......... 2 tablets daily until you are significantly improved  Albuterol......... 2 puffs in the morning.... followed by 2 puffs of Symbicort............. and in the evening repeat above  Drink lots of water  Hydromet...........Marland Kitchen 1/2-1 teaspoon at bedtime when necessary for nighttime cough  Azithromycin..........Marland Kitchen 1 daily 10 days  Return next Monday for follow-up..  Since you had significant improvement with the handheld nebulizer with albuterol work on a switch to that. In the morning do a nebulizer treatment and then take 2 puffs of your Symbicort. Repeat that before bedtime

## 2017-06-22 NOTE — Progress Notes (Signed)
ofJudy is a 72 year old married female nonsmoker comes in today because of a flare of her asthma  She has a history of mild intermittent asthma. She usually has a flare about once a year. She's been symptoms free since last May. After that episode she continues to take her Singulair daily along with Symbicort 1 puff twice a day.  About a month ago she developed postnasal drip and that triggered her coughing. A week ago she thinks she picked up a viral infection and therefore started herself back on the prednisone 40 mg daily. She says she doesn't feel whole lot better. In the last couple days she's had more shortness of breath coughing more and bringing up yellow sputum. No fever chills.  BP 126/64 (BP Location: Left Arm, Patient Position: Sitting, Cuff Size: Normal)   Pulse 64   Temp 99.1 F (37.3 C) (Oral)   Wt 115 lb (52.2 kg)   BMI 24.89 kg/m  Well-developed well-nourished female no acute distress vital signs stable she's afebrile HEENT were negative neck was supple no adenopathy lungs shows symmetrical breath sounds bilateral wheezing mild to moderate  #1 mild intermittent asthma with severe flare........... handheld nebulizer with albuterol,,,,,,,, also prescribed handheld nebulizer albuterol for home treatment  Continue prednisone at 40 mg daily until clear then taper as outlined  Abdomen azithromycin 1 tab daily for 10 days  Follow-up next Monday

## 2017-06-23 ENCOUNTER — Other Ambulatory Visit: Payer: Self-pay

## 2017-06-24 ENCOUNTER — Other Ambulatory Visit: Payer: Self-pay

## 2017-06-24 DIAGNOSIS — R062 Wheezing: Secondary | ICD-10-CM

## 2017-06-27 ENCOUNTER — Encounter: Payer: Self-pay | Admitting: Family Medicine

## 2017-06-27 ENCOUNTER — Encounter: Payer: Self-pay | Admitting: Internal Medicine

## 2017-06-27 ENCOUNTER — Ambulatory Visit: Payer: Medicare Other | Admitting: Internal Medicine

## 2017-06-27 ENCOUNTER — Ambulatory Visit: Payer: Medicare Other | Admitting: Family Medicine

## 2017-06-27 VITALS — BP 110/60 | HR 72 | Ht <= 58 in | Wt 118.0 lb

## 2017-06-27 VITALS — BP 120/68 | HR 64 | Temp 98.5°F | Wt 117.0 lb

## 2017-06-27 DIAGNOSIS — J4521 Mild intermittent asthma with (acute) exacerbation: Secondary | ICD-10-CM | POA: Diagnosis not present

## 2017-06-27 DIAGNOSIS — K642 Third degree hemorrhoids: Secondary | ICD-10-CM

## 2017-06-27 HISTORY — DX: Third degree hemorrhoids: K64.2

## 2017-06-27 NOTE — Patient Instructions (Signed)
Next Monday begin to taper the prednisone if you feel 100% better......... 30 mg daily for 5 days,.... 20 mg daily for 5 days,......Marland Kitchen 10 mg daily for 5 days,........Marland Kitchen been 10 mg Monday Wednesday Friday for a three-week taper  1 sure 100% well you can stop the albuterol nebulizers  As previously I would continue the Symbicort and the Singulair all year round.

## 2017-06-27 NOTE — Progress Notes (Signed)
   Hemorrhoid ligation: Symptoms are bleeding, prolapse, fecal seepage.  Colonoscopy done in late 2018.  Diverticulosis and internal hemorrhoids  Courtney [name], [place] present.  Rectal: There is a normal anoderm, rectum is somewhat dilated nontender resting anal tone slightly reduced no prolapse with Valsalva  Anoscopy demonstrates grade 2 internal hemorrhoids 3 positions mildly to moderately inflamed  PROCEDURE NOTE: The patient presents with symptomatic grade 3 by history hemorrhoids, requesting rubber band ligation of his/her hemorrhoidal disease.  All risks, benefits and alternative forms of therapy were described and informed consent was obtained.   The anorectum was pre-medicated with topical lidocaine and nitroglycerin The decision was made to band the all 3 internal hemorrhoids per patient request, and the [place] was used to perform band ligation without complication.  Digital anorectal examination was then performed to assure proper positioning of the band, and to adjust the banded tissue as required.  The patient was discharged home without pain or other issues.  Dietary and behavioral recommendations were given and along with follow-up instructions.     The following adjunctive treatments were recommended:  None needed  The patient will return as needed for  follow-up and possible additional banding as required. No complications were encountered and the patient tolerated the procedure well.  I appreciate the opportunity to care for this patient. CC: Dorena Cookey, MD

## 2017-06-27 NOTE — Assessment & Plan Note (Signed)
All 3 hemorrhoid columns banded today.  She will follow-up as needed after 2 months passes.  Sooner if needed but would wait 2 months to see full effect of the banding.  She does have relatively frequent defecation 2 or 3 times a day but says they are formed and there is no straining.

## 2017-06-27 NOTE — Patient Instructions (Signed)
  HEMORRHOID BANDING PROCEDURE    FOLLOW-UP CARE   1. The procedure you have had should have been relatively painless since the banding of the area involved does not have nerve endings and there is no pain sensation.  The rubber band cuts off the blood supply to the hemorrhoid and the band may fall off as soon as 48 hours after the banding (the band may occasionally be seen in the toilet bowl following a bowel movement). You may notice a temporary feeling of fullness in the rectum which should respond adequately to plain Tylenol or Motrin.  2. Following the banding, avoid strenuous exercise that evening and resume full activity the next day.  A sitz bath (soaking in a warm tub) or bidet is soothing, and can be useful for cleansing the area after bowel movements.     3. To avoid constipation, take two tablespoons of natural wheat bran, natural oat bran, flax, Benefiber or any over the counter fiber supplement and increase your water intake to 7-8 glasses daily.    4. Unless you have been prescribed anorectal medication, do not put anything inside your rectum for two weeks: No suppositories, enemas, fingers, etc.  5. Occasionally, you may have more bleeding than usual after the banding procedure.  This is often from the untreated hemorrhoids rather than the treated one.  Don't be concerned if there is a tablespoon or so of blood.  If there is more blood than this, lie flat with your bottom higher than your head and apply an ice pack to the area. If the bleeding does not stop within a half an hour or if you feel faint, call our office at [phone] or go to the emergency room.  6. Problems are not common; however, if there is a substantial amount of bleeding, severe pain, chills, fever or difficulty passing urine (very rare) or other problems, you should call us at [phone] or report to the nearest emergency room.  7. Do not stay seated continuously for more than 2-3 hours for a day or  two after the procedure.  Tighten your buttock muscles 10-15 times every two hours and take 10-15 deep breaths every 1-2 hours.  Do not spend more than a few minutes on the toilet if you cannot empty your bowel; instead re-visit the toilet at a later time.    Please call us back if you feel you need more banding.  It can take up to 2 months to see the full effect of the bandings.  I appreciate the opportunity to care for you. Silvano Rusk, MD, [facility]

## 2017-06-27 NOTE — Progress Notes (Signed)
Bethena Roys is a 72 year old married female nonsmoker who comes in today for follow-up of asthma  She has a history of mild intermittent asthma which usually flares up once here she develops a viral infection. This year she developed a viral infection and indeed it triggered her asthma. We saw her a week ago. She had started on prednisone a couple days prior to coming in but didn't feel much better. That time she had no fever although she did have a lot of yellow sputum production.  At that juncture we started on medications as outlined as we have done previously. This is a same protocol that she got from pulmonary.  She comes in today saying she is somewhat improved.  BP 120/68 (BP Location: Left Arm, Patient Position: Sitting, Cuff Size: Normal)   Pulse 64   Temp 98.5 F (36.9 C) (Oral)   Wt 117 lb (53.1 kg)   BMI 25.32 kg/m  Well-developed well-nourished female no acute distress vital signs stable she's afebrile.  Lung exam shows increased breath sounds decreased wheezing.  #1 asthma mild intermittent........... improved on treatment program........ continue current treatment program begin to taper prednisone one week if well

## 2017-08-03 ENCOUNTER — Encounter: Payer: Self-pay | Admitting: Cardiology

## 2017-08-03 ENCOUNTER — Ambulatory Visit: Payer: Medicare Other | Admitting: Cardiology

## 2017-08-03 VITALS — BP 110/68 | HR 77 | Ht <= 58 in | Wt 118.0 lb

## 2017-08-03 DIAGNOSIS — G4733 Obstructive sleep apnea (adult) (pediatric): Secondary | ICD-10-CM | POA: Diagnosis not present

## 2017-08-03 DIAGNOSIS — I10 Essential (primary) hypertension: Secondary | ICD-10-CM

## 2017-08-03 HISTORY — DX: Obstructive sleep apnea (adult) (pediatric): G47.33

## 2017-08-03 NOTE — Patient Instructions (Signed)
Medication Instructions:  Your physician recommends that you continue on your current medications as directed. Please refer to the Current Medication list given to you today.  If you need a refill on your cardiac medications, please contact your pharmacy first.  Labwork: None ordered   Testing/Procedures: None ordered   Follow-Up: Your physician wants you to follow-up as needed with Dr. Radford Pax   Any Other Special Instructions Will Be Listed Below (If Applicable). Your physician has referred you to Dr. Erik Obey, ENT for consult regarding OSA   Thank you for choosing [facility], RN  [phone]  If you need a refill on your cardiac medications before your next appointment, please call your pharmacy.

## 2017-08-03 NOTE — Progress Notes (Addendum)
Cardiology Office Note:    Date:  08/03/2017   ID:  Courtney [name], DOB [birth year], MRN [id number]  PCP:  Dorena Cookey, MD  Cardiologist:  No primary care provider on file.    Referring MD: Dorena Cookey, MD   Chief Complaint  Patient presents with  . Sleep Apnea    History of Present Illness:    Courtney [name] is a 72 y.o. female with a hx of hypertension and diastolic dysfunction who recently saw Dr. Acie Fredrickson and stated that she was waking up at night with sudden onset of palpitations.  She says that her husband states that she snores and occasionally will stop breathing.  She was referred for sleep study.  Her sleep study showed very mild obstructive sleep apnea with an AHI of 7.2/h overall.   Ahi during REM sleep was 10/h and during supine sleep 12.8/h.  Lowest oxygen saturation was 82%.  She is now here to discuss results.  Past Medical History:  Diagnosis Date  . Breast cancer [facility]) July 2012   post double mastectomy; Dr Humphrey Rolls  . Diverticulosis   . Family history of breast cancer   . GERD (gastroesophageal reflux disease)    S/P dilation X 1  . Hypertension   . Night sweats   . OSA (obstructive sleep apnea) 08/03/2017   Very mild with AHI 7.2/hr.    . Reactive airway disease     Past Surgical History:  Procedure Laterality Date  . ABDOMINAL HYSTERECTOMY    . bilateral breast reconstruction  Jan 2013  . BREAST SURGERY  01/13/11   bilateral total mastectomy; Dr Dalbert Batman  . COLONOSCOPY  2014   Dr Olevia Perches; hyperplastic polyp  . ESOPHAGEAL DILATION  2014  . NASAL SEPTUM SURGERY    . RECONSTRUCTION / CORRECTION OF NIPPLE / Tresa Moore  June 2013  . THYROGLOSSAL DUCT CYST      Current Medications: Current Meds  Medication Sig  . amLODipine (NORVASC) 5 MG tablet TAKE 1 TABLET(5 MG) BY MOUTH DAILY  . aspirin 81 MG tablet Take 81 mg by mouth daily.    . budesonide-formoterol (SYMBICORT) 160-4.5 MCG/ACT inhaler INHALE 1 TO 2 PUFFS EVERY 12 HOURS AS NEEDED. GARGLE AND SPIT AFTER  USE  . calcium citrate-vitamin D (CITRACAL+D) 315-200 MG-UNIT per tablet Take 1 tablet by mouth daily.  . Cholecalciferol (VITAMIN D3) 1000 UNITS CAPS Take by mouth daily.    . clobetasol (TEMOVATE) 0.05 % external solution Apply 1 application topically at bedtime as needed (1-2 weeks).  Marland Kitchen co-enzyme Q-10 30 MG capsule Take 50 mg by mouth daily.   . fluticasone (FLONASE) 50 MCG/ACT nasal spray Place 1 spray into both nostrils daily.  . Glucosamine Sulfate 500 MG CAPS Take 3 capsules by mouth daily.  Marland Kitchen losartan-hydrochlorothiazide (HYZAAR) 100-12.5 MG tablet TAKE 1 TABLET BY MOUTH DAILY  . Misc Natural Products (TART CHERRY ADVANCED PO) Take 2,400 mg by mouth daily.  . montelukast (SINGULAIR) 10 MG tablet Take 1 tablet (10 mg total) by mouth at bedtime.  . Multiple Vitamins-Minerals (MULTIVITAMIN WITH MINERALS) tablet Take 2 tablets by mouth daily.    . OMEGA 3 1000 MG CAPS Take 1,000 mg by mouth daily.    Marland Kitchen omeprazole (PRILOSEC) 20 MG capsule TAKE 1 CAPSULE BY MOUTH TWICE DAILY BEFORE A MEAL  . predniSONE (DELTASONE) 20 MG tablet Uses directed  . psyllium (METAMUCIL) 58.6 % powder Take 1 packet by mouth daily.  Marland Kitchen tretinoin (RETIN-A) 0.025 % cream Apply topically at  bedtime.   Current Facility-Administered Medications for the 08/03/17 encounter (Office Visit) with Sueanne Margarita, MD  Medication  . 0.9 %  sodium chloride infusion  . albuterol (PROVENTIL) (2.5 MG/3ML) 0.083% nebulizer solution 2.5 mg     Allergies:   Patient has no known allergies.   Social History   Socioeconomic History  . Marital status: Married    Spouse name: Not on file  . Number of children: Not on file  . Years of education: Not on file  . Highest education level: Not on file  Occupational History  . Not on file  Social Needs  . Financial resource strain: Not on file  . Food insecurity:    Worry: Not on file    Inability: Not on file  . Transportation needs:    Medical: Not on file    Non-medical: Not on  file  Tobacco Use  . Smoking status: Former Smoker    Years: 5.00    Types: Cigarettes    Last attempt to quit: 05/03/1965    Years since quitting: 52.2  . Smokeless tobacco: Never Used  . Tobacco comment: smoked [phone], less than 1 ppd  Substance and Sexual Activity  . Alcohol use: Yes    Alcohol/week: 8.4 oz    Types: 14 Glasses of wine per week    Comment: 2-3 /day  . Drug use: No  . Sexual activity: Yes  Lifestyle  . Physical activity:    Days per week: Not on file    Minutes per session: Not on file  . Stress: Not on file  Relationships  . Social connections:    Talks on phone: Not on file    Gets together: Not on file    Attends religious service: Not on file    Active member of club or organization: Not on file    Attends meetings of clubs or organizations: Not on file    Relationship status: Not on file  Other Topics Concern  . Not on file  Social History Narrative  . Not on file     Family History: The patient's family history includes Breast cancer in her cousin and other; Breast cancer (age of onset: 53) in her mother; Cancer (age of onset: 35) in her sister; Cervical cancer (age of onset: 61) in her mother; Colon cancer (age of onset: 32) in her mother; Diabetes in her father; Heart attack in her maternal uncle, paternal grandfather, paternal uncle, and paternal uncle; Heart attack (age of onset: 20) in her maternal grandfather; Heart attack (age of onset: 20) in her father; Parkinson's disease in her maternal grandmother; Skin cancer in her father. There is no history of Stroke.  ROS:   Please see the history of present illness.    ROS  All other systems reviewed and negative.   EKGs/Labs/Other Studies Reviewed:    The following studies were reviewed today: Sleep study  EKG:  EKG is ordered today and showed NSR at 77bpm with no ST changes  Recent Labs: 01/10/2017: ALT 34; BUN 17; Creatinine, Ser 0.64; Hemoglobin 13.4; Platelets 199.0; Potassium 3.9;  Sodium 142; TSH 1.18   Recent Lipid Panel    Component Value Date/Time   CHOL 231 (H) 01/10/2017 1200   TRIG 52.0 01/10/2017 1200   HDL 68.60 01/10/2017 1200   CHOLHDL 3 01/10/2017 1200   VLDL 10.4 01/10/2017 1200   LDLCALC 152 (H) 01/10/2017 1200   LDLDIRECT 131.3 02/29/2012 1043    Physical Exam:  VS:  BP 110/68   Pulse 77   Ht 4\' 9"  (1.448 m)   Wt 118 lb (53.5 kg)   BMI 25.53 kg/m     Wt Readings from Last 3 Encounters:  08/03/17 118 lb (53.5 kg)  06/27/17 118 lb (53.5 kg)  06/27/17 117 lb (53.1 kg)     GEN:  Well nourished, well developed in no acute distress HEENT: Normal NECK: No JVD; No carotid bruits LYMPHATICS: No lymphadenopathy CARDIAC: RRR, no murmurs, rubs, gallops RESPIRATORY:  Clear to auscultation without rales, wheezing or rhonchi  ABDOMEN: Soft, non-tender, non-distended MUSCULOSKELETAL:  No edema; No deformity  SKIN: Warm and dry NEUROLOGIC:  Alert and oriented x 3 PSYCHIATRIC:  Normal affect   ASSESSMENT:    1. OSA (obstructive sleep apnea)   2. Essential hypertension    PLAN:    In order of problems listed above:  1.  OSA - sleep study showed very mild obstructive sleep apnea with an AHI of 7.2/h overall and during REM sleep was 10/h and during supine sleep was 12.8/h.  Her lowest oxygen saturation was 82% but no significant oxygen desaturations less than 88% for over 5 minutes.  2.  HTN -blood pressures well controlled on exam today.  She will continue on amlodipine 5 mg daily, Hyzaar 100-12.5 mg daily   Medication Adjustments/Labs and Tests Ordered: Current medicines are reviewed at length with the patient today.  Concerns regarding medicines are outlined above.  Orders Placed This Encounter  Procedures  . EKG 12-Lead   No orders of the defined types were placed in this encounter.   Signed, Fransico Him, MD  08/03/2017 11:10 AM    [place]

## 2017-08-04 ENCOUNTER — Telehealth: Payer: Self-pay | Admitting: Internal Medicine

## 2017-08-04 NOTE — Telephone Encounter (Signed)
States banding fell off the day after the banding. Pt scheduled to see Carlean Purl for repeat banding per last banding note

## 2017-08-12 ENCOUNTER — Telehealth: Payer: Self-pay | Admitting: Adult Health

## 2017-08-12 NOTE — Telephone Encounter (Signed)
Patient called to reschedule  °

## 2017-08-15 ENCOUNTER — Encounter: Payer: Medicare Other | Admitting: Internal Medicine

## 2017-08-23 ENCOUNTER — Encounter: Payer: Medicare Other | Admitting: Adult Health

## 2017-08-30 ENCOUNTER — Encounter: Payer: Self-pay | Admitting: Adult Health

## 2017-08-30 ENCOUNTER — Telehealth: Payer: Self-pay | Admitting: Adult Health

## 2017-08-30 ENCOUNTER — Inpatient Hospital Stay: Payer: Medicare Other | Attending: Adult Health | Admitting: Adult Health

## 2017-08-30 VITALS — BP 110/71 | HR 72 | Temp 98.6°F | Resp 18 | Ht <= 58 in | Wt 118.5 lb

## 2017-08-30 DIAGNOSIS — Z853 Personal history of malignant neoplasm of breast: Secondary | ICD-10-CM | POA: Diagnosis not present

## 2017-08-30 DIAGNOSIS — C50912 Malignant neoplasm of unspecified site of left female breast: Secondary | ICD-10-CM

## 2017-08-30 DIAGNOSIS — M8580 Other specified disorders of bone density and structure, unspecified site: Secondary | ICD-10-CM | POA: Insufficient documentation

## 2017-08-30 DIAGNOSIS — Z170 Estrogen receptor positive status [ER+]: Secondary | ICD-10-CM

## 2017-08-30 DIAGNOSIS — C50911 Malignant neoplasm of unspecified site of right female breast: Secondary | ICD-10-CM

## 2017-08-30 DIAGNOSIS — E2839 Other primary ovarian failure: Secondary | ICD-10-CM

## 2017-08-30 NOTE — Telephone Encounter (Signed)
Gave avs and calendar ° °

## 2017-08-30 NOTE — Progress Notes (Signed)
CLINIC:  Survivorship   REASON FOR VISIT:  Routine follow-up for history of breast cancer.   BRIEF ONCOLOGIC HISTORY:  (1) status post bilateral mastectomieswith bilateral sentinel lymph node sampling 01/13/2011, showing (a) on the left, 2 foci of invasive ductal carcinoma, the larger measuring 1.2 cm, grade 2, both tumors being strongly estrogen and progesterone receptor positive, with MIB-once of 13-14%, and HER-2/neu negative. Her (pT1c pN0 = stage IA) (b) on the right, apT1c pN0, stage IA invasive ductal carcinoma, grade 2, estrogen receptor 100% positive, progesterone receptor 9% positive, with an MIB-1 of 12% and no HER-2 amplification  (2) Oncotype DX on the largest tumor showed a score of 10, suggesting a risk of recurrence outside of the breast within the next 10 years would be 7% if the patient only systemic therapy was tamoxifen for 5 years. It also predicts no benefit from chemotherapy  (3) letrozolewas started October of 2012  (4) Osteopenia, with T score of -1.4 on bone density at [facility] 11/05/2013.   INTERVAL HISTORY:  Courtney [name] presents to the Survivorship Clinic today for routine follow-up for her history of breast cancer.  Overall, she reports feeling quite well. She denies any issues today.  She sees her PCP regularly, and is up to date with her cancer screenings.      REVIEW OF SYSTEMS:  Review of Systems  Constitutional: Negative for appetite change, chills, fatigue, fever and unexpected weight change.  HENT:   Negative for hearing loss, lump/mass and trouble swallowing.   Eyes: Negative for eye problems and icterus.  Respiratory: Negative for chest tightness, cough and shortness of breath.   Cardiovascular: Negative for chest pain, leg swelling and palpitations.  Gastrointestinal: Negative for abdominal distention, abdominal pain, constipation, diarrhea, nausea and vomiting.  Endocrine: Negative for hot flashes.  Musculoskeletal:  Negative for arthralgias.  Skin: Negative for itching and rash.  Neurological: Negative for dizziness, extremity weakness, headaches and numbness.  Hematological: Negative for adenopathy. Does not bruise/bleed easily.  Psychiatric/Behavioral: Negative for depression. The patient is not nervous/anxious.    Breast: Denies any new nodularity, masses, tenderness, nipple changes, or nipple discharge.       PAST MEDICAL/SURGICAL HISTORY:  Past Medical History:  Diagnosis Date  . Breast cancer [place]) July 2012   post double mastectomy; Dr Humphrey Rolls  . Diverticulosis   . Family history of breast cancer   . GERD (gastroesophageal reflux disease)    S/P dilation X 1  . Hypertension   . Night sweats   . OSA (obstructive sleep apnea) 08/03/2017   Very mild with AHI 7.2/hr.    . Reactive airway disease    Past Surgical History:  Procedure Laterality Date  . ABDOMINAL HYSTERECTOMY    . bilateral breast reconstruction  Jan 2013  . BREAST SURGERY  01/13/11   bilateral total mastectomy; Dr Dalbert Batman  . COLONOSCOPY  2014   Dr Olevia Perches; hyperplastic polyp  . ESOPHAGEAL DILATION  2014  . HEMORRHOID BANDING    . NASAL SEPTUM SURGERY    . RECONSTRUCTION / CORRECTION OF NIPPLE / Tresa Moore  June 2013  . THYROGLOSSAL DUCT CYST       ALLERGIES:  No Known Allergies   CURRENT MEDICATIONS:  Outpatient Encounter Medications as of 08/30/2017  Medication Sig  . amLODipine (NORVASC) 5 MG tablet TAKE 1 TABLET(5 MG) BY MOUTH DAILY  . aspirin 81 MG tablet Take 81 mg by mouth daily.    . budesonide-formoterol (SYMBICORT) 160-4.5 MCG/ACT inhaler INHALE 1 TO 2 PUFFS  EVERY 12 HOURS AS NEEDED. GARGLE AND SPIT AFTER USE  . calcium citrate-vitamin D (CITRACAL+D) 315-200 MG-UNIT per tablet Take 1 tablet by mouth daily.  . Cholecalciferol (VITAMIN D3) 1000 UNITS CAPS Take by mouth daily.    . clobetasol (TEMOVATE) 0.05 % external solution Apply 1 application topically at bedtime as needed (1-2 weeks).  Marland Kitchen co-enzyme Q-10 30  MG capsule Take 50 mg by mouth daily.   Marland Kitchen FINACEA 15 % cream Apply 1 application topically 2 (two) times daily. For rosacea  . fluticasone (FLONASE) 50 MCG/ACT nasal spray Place 1 spray into both nostrils daily.  . Glucosamine Sulfate 500 MG CAPS Take 3 capsules by mouth daily.  Marland Kitchen losartan-hydrochlorothiazide (HYZAAR) 100-12.5 MG tablet TAKE 1 TABLET BY MOUTH DAILY  . Misc Natural Products (TART CHERRY ADVANCED PO) Take 2,400 mg by mouth daily.  . montelukast (SINGULAIR) 10 MG tablet Take 1 tablet (10 mg total) by mouth at bedtime.  . Multiple Vitamins-Minerals (MULTIVITAMIN WITH MINERALS) tablet Take 2 tablets by mouth daily.    . OMEGA 3 1000 MG CAPS Take 1,000 mg by mouth daily.    Marland Kitchen omeprazole (PRILOSEC) 20 MG capsule TAKE 1 CAPSULE BY MOUTH TWICE DAILY BEFORE A MEAL  . psyllium (METAMUCIL) 58.6 % powder Take 1 packet by mouth daily.  Marland Kitchen tretinoin (RETIN-A) 0.025 % cream Apply topically at bedtime.  . [DISCONTINUED] predniSONE (DELTASONE) 20 MG tablet Uses directed (Patient not taking: Reported on 08/30/2017)   Facility-Administered Encounter Medications as of 08/30/2017  Medication  . 0.9 %  sodium chloride infusion  . albuterol (PROVENTIL) (2.5 MG/3ML) 0.083% nebulizer solution 2.5 mg     ONCOLOGIC FAMILY HISTORY:  Family History  Problem Relation Age of Onset  . Breast cancer Mother 30  . Colon cancer Mother 77  . Cervical cancer Mother 42       s/p hysterectomy  . Heart attack Father 70  . Diabetes Father        probable pre DM  . Skin cancer Father        maybe melanoma  . Cancer Sister 68       appendiceal  . Heart attack Maternal Grandfather 42  . Heart attack Paternal Uncle        X2 ; >50y  . Heart attack Maternal Uncle        d. 86  . Parkinson's disease Maternal Grandmother        d. 98  . Heart attack Paternal Grandfather   . Breast cancer Other        unspecified age; maternal great aunt (MGM's sister)  . Breast cancer Cousin        maternal 1st cousin,  once-removed; unspecified age  . Heart attack Paternal Uncle        >50y  . Stroke Neg Hx      SOCIAL HISTORY:  Social History   Socioeconomic History  . Marital status: Married    Spouse name: Not on file  . Number of children: Not on file  . Years of education: Not on file  . Highest education level: Not on file  Occupational History  . Not on file  Social Needs  . Financial resource strain: Not on file  . Food insecurity:    Worry: Not on file    Inability: Not on file  . Transportation needs:    Medical: Not on file    Non-medical: Not on file  Tobacco Use  . Smoking status: Former Smoker  Years: 5.00    Types: Cigarettes    Last attempt to quit: 05/03/1965    Years since quitting: 52.3  . Smokeless tobacco: Never Used  . Tobacco comment: smoked [phone], less than 1 ppd  Substance and Sexual Activity  . Alcohol use: Yes    Alcohol/week: 8.4 oz    Types: 14 Glasses of wine per week    Comment: 2-3 /day  . Drug use: No  . Sexual activity: Yes  Lifestyle  . Physical activity:    Days per week: Not on file    Minutes per session: Not on file  . Stress: Not on file  Relationships  . Social connections:    Talks on phone: Not on file    Gets together: Not on file    Attends religious service: Not on file    Active member of club or organization: Not on file    Attends meetings of clubs or organizations: Not on file    Relationship status: Not on file  . Intimate partner violence:    Fear of current or ex partner: Not on file    Emotionally abused: Not on file    Physically abused: Not on file    Forced sexual activity: Not on file  Other Topics Concern  . Not on file  Social History Narrative  . Not on file      PHYSICAL EXAMINATION:  Vital Signs: Vitals:   08/30/17 1110  BP: 110/71  Pulse: 72  Resp: 18  Temp: 98.6 F (37 C)  SpO2: 100%   Filed Weights   08/30/17 1110  Weight: 118 lb 8 oz (53.8 kg)   General: Well-nourished,  well-appearing female in no acute distress.  Unaccompanied today.   HEENT: Head is normocephalic.  Pupils equal and reactive to light. Conjunctivae clear without exudate.  Sclerae anicteric. Oral mucosa is pink, moist.  Oropharynx is pink without lesions or erythema.  Lymph: No cervical, supraclavicular, or infraclavicular lymphadenopathy noted on palpation.  Cardiovascular: Regular rate and rhythm.Marland Kitchen Respiratory: Clear to auscultation bilaterally. Chest expansion symmetric; breathing non-labored.  Breast Exam:  -s/p bilateral mastectomies with implant placement, no nodules or masses noted bilaterally -Axilla: No axillary adenopathy bilaterally.  GI: Abdomen soft and round; non-tender, non-distended. Bowel sounds normoactive. No hepatosplenomegaly.   GU: Deferred.  Neuro: No focal deficits. Steady gait.  Psych: Mood and affect normal and appropriate for situation.  MSK: No focal spinal tenderness to palpation, full range of motion in bilateral upper extremities Extremities: No edema. Skin: Warm and dry.  LABORATORY DATA:  None for this visit   DIAGNOSTIC IMAGING:  Most recent mammogram:  N/a s/p bilateral mastectomies    ASSESSMENT AND PLAN:  Ms.. Dagher is a pleasant 72 y.o. 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  She presents to the Survivorship Clinic for surveillance and routine follow-up.   1. History of breast cancer:  Ms. Rufer is currently clinically and radiographically without evidence of disease or recurrence of breast cancer. She and I reviewed imaging and reasons we might order a MRI of the breasts.  She will let us know if she has any issues that we might need to do additional screening for.  We will see her back in a year for LTS follow up.  She does not want to graduate.  I encouraged her to call me with any  questions or concerns before her next visit at the cancer center, and I would be happy to see her sooner, if needed.    2. Bone health:  Given Ms. Harston's age, history of breast cancer, and her previous anti-estrogen therapy with Letrozole, she is at risk for bone demineralization. She has undergone DEXA in 11/2015 that was consistent with osteopenia. She is due for repeat bone density testing in 10/2017 and I went ahead and ordered this today.  She was given education on specific food and activities to promote bone health.  3. Cancer screening:  Due to Ms. Minerd's history and her age, she should receive screening for skin cancers, colon cancer, and gynecologic cancers. She was encouraged to follow-up with her PCP for appropriate cancer screenings.   4. Health maintenance and wellness promotion: Ms. Carmon was encouraged to consume 5-7 servings of fruits and vegetables per day. She was also encouraged to engage in moderate to vigorous exercise for 30 minutes per day most days of the week. She was instructed to limit her alcohol consumption and continue to abstain from tobacco use.     Dispo:  -Return to cancer center in one year for LTS follow up -Bone density in 10/2017 at [facility]   A total of (30) minutes of face-to-face time was spent with this patient with greater than 50% of that time in counseling and care-coordination.   Gardenia Phlegm, [place] [phone]   Note: PRIMARY CARE PROVIDER Dorena Cookey, [place] [phone]

## 2017-08-30 NOTE — Patient Instructions (Signed)
Bone Health Bones protect organs, store calcium, and anchor muscles. Good health habits, such as eating nutritious foods and exercising regularly, are important for maintaining healthy bones. They can also help to prevent a condition that causes bones to lose density and become weak and brittle (osteoporosis). Why is bone mass important? Bone mass refers to the amount of bone tissue that you have. The higher your bone mass, the stronger your bones. An important step toward having healthy bones throughout life is to have strong and dense bones during childhood. A young adult who has a high bone mass is more likely to have a high bone mass later in life. Bone mass at its greatest it is called peak bone mass. A large decline in bone mass occurs in older adults. In women, it occurs about the time of menopause. During this time, it is important to practice good health habits, because if more bone is lost than what is replaced, the bones will become less healthy and more likely to break (fracture). If you find that you have a low bone mass, you may be able to prevent osteoporosis or further bone loss by changing your diet and lifestyle. How can I find out if my bone mass is low? Bone mass can be measured with an X-ray test that is called a bone mineral density (BMD) test. This test is recommended for all women who are age 65 or older. It may also be recommended for men who are age 70 or older, or for people who are more likely to develop osteoporosis due to:  Having bones that break easily.  Having a long-term disease that weakens bones, such as kidney disease or rheumatoid arthritis.  Having menopause earlier than normal.  Taking medicine that weakens bones, such as steroids, thyroid hormones, or hormone treatment for breast cancer or prostate cancer.  Smoking.  Drinking three or more alcoholic drinks each day.  What are the nutritional recommendations for healthy bones? To have healthy bones, you  need to get enough of the right minerals and vitamins. Most nutrition experts recommend getting these nutrients from the foods that you eat. Nutritional recommendations vary from person to person. Ask your health care provider what is healthy for you. Here are some general guidelines. Calcium Recommendations Calcium is the most important (essential) mineral for bone health. Most people can get enough calcium from their diet, but supplements may be recommended for people who are at risk for osteoporosis. Good sources of calcium include:  Dairy products, such as low-fat or nonfat milk, cheese, and yogurt.  Dark green leafy vegetables, such as bok choy and broccoli.  Calcium-fortified foods, such as orange juice, cereal, bread, soy beverages, and tofu products.  Nuts, such as almonds.  Follow these recommended amounts for daily calcium intake:  Children, age 1?3: 700 mg.  Children, age 4?8: 1,000 mg.  Children, age 9?13: 1,300 mg.  Teens, age 14?18: 1,300 mg.  Adults, age 19?50: 1,000 mg.  Adults, age 51?70: ? Men: 1,000 mg. ? Women: 1,200 mg.  Adults, age 71 or older: 1,200 mg.  Pregnant and breastfeeding females: ? Teens: 1,300 mg. ? Adults: 1,000 mg.  Vitamin D Recommendations Vitamin D is the most essential vitamin for bone health. It helps the body to absorb calcium. Sunlight stimulates the skin to make vitamin D, so be sure to get enough sunlight. If you live in a cold climate or you do not get outside often, your health care provider may recommend that you take vitamin   D supplements. Good sources of vitamin D in your diet include:  Egg yolks.  Saltwater fish.  Milk and cereal fortified with vitamin D.  Follow these recommended amounts for daily vitamin D intake:  Children and teens, age 1?18: 600 international units.  Adults, age 50 or younger: 400-800 international units.  Adults, age 51 or older: 800-1,000 international units.  Other Nutrients Other nutrients  for bone health include:  Phosphorus. This mineral is found in meat, poultry, dairy foods, nuts, and legumes. The recommended daily intake for adult men and adult women is 700 mg.  Magnesium. This mineral is found in seeds, nuts, dark green vegetables, and legumes. The recommended daily intake for adult men is 400?420 mg. For adult women, it is 310?320 mg.  Vitamin K. This vitamin is found in green leafy vegetables. The recommended daily intake is 120 mg for adult men and 90 mg for adult women.  What type of physical activity is best for building and maintaining healthy bones? Weight-bearing and strength-building activities are important for building and maintaining peak bone mass. Weight-bearing activities cause muscles and bones to work against gravity. Strength-building activities increases muscle strength that supports bones. Weight-bearing and muscle-building activities include:  Walking and hiking.  Jogging and running.  Dancing.  Gym exercises.  Lifting weights.  Tennis and racquetball.  Climbing stairs.  Aerobics.  Adults should get at least 30 minutes of moderate physical activity on most days. Children should get at least 60 minutes of moderate physical activity on most days. Ask your health care provide what type of exercise is best for you. Where can I find more information? For more information, check out the following websites:  National Osteoporosis Foundation: http://nof.org/learn/basics  National Institutes of Health: http://www.niams.nih.gov/Health_Info/Bone/Bone_Health/bone_health_for_life.asp  This information is not intended to replace advice given to you by your health care provider. Make sure you discuss any questions you have with your health care provider. Document Released: 07/10/2003 Document Revised: 11/07/2015 Document Reviewed: 04/24/2014 Elsevier Interactive Patient Education  2018 Elsevier Inc.  

## 2017-09-20 ENCOUNTER — Telehealth: Payer: Self-pay | Admitting: *Deleted

## 2017-09-20 DIAGNOSIS — G4734 Idiopathic sleep related nonobstructive alveolar hypoventilation: Secondary | ICD-10-CM

## 2017-09-20 NOTE — Telephone Encounter (Signed)
Patient states she has been using Afrin nasal spray to open her nasal passages up so she does not mouth breathe as much and that has helped her with her snoring. Patient is willing to see Dr Augustina Mood to discuss an oral device if necessary.

## 2017-09-20 NOTE — Telephone Encounter (Signed)
-----   Message from Sueanne Margarita, MD sent at 09/14/2017  9:15 AM EDT ----- Notes received from ENT in regards to CPAP therapy vs. Oral device.  She has very minimal OSA.  ENT felt she would likely not tolerate CPAP.  Please get an overnight pulse ox to see if she has any nocturnal hypoxemia.  Also call her to see if she is interested in seeing dentistry for oral device.  Fransico Him

## 2017-09-20 NOTE — Telephone Encounter (Signed)
Referral sent 

## 2017-09-20 NOTE — Telephone Encounter (Signed)
  Courtney Margarita, MD  Freada Bergeron, CMA        Ok to refer to Dr. Garen Grams

## 2017-09-21 ENCOUNTER — Ambulatory Visit: Payer: Medicare Other | Admitting: Internal Medicine

## 2017-09-21 ENCOUNTER — Encounter: Payer: Self-pay | Admitting: Internal Medicine

## 2017-09-21 DIAGNOSIS — K642 Third degree hemorrhoids: Secondary | ICD-10-CM

## 2017-09-21 NOTE — Assessment & Plan Note (Addendum)
Banded RP and RA RTC prn

## 2017-09-21 NOTE — Patient Instructions (Signed)
HEMORRHOID BANDING PROCEDURE    FOLLOW-UP CARE   1. The procedure you have had should have been relatively painless since the banding of the area involved does not have nerve endings and there is no pain sensation.  The rubber band cuts off the blood supply to the hemorrhoid and the band may fall off as soon as 48 hours after the banding (the band may occasionally be seen in the toilet bowl following a bowel movement). You may notice a temporary feeling of fullness in the rectum which should respond adequately to plain Tylenol or Motrin.  2. Following the banding, avoid strenuous exercise that evening and resume full activity the next day.  A sitz bath (soaking in a warm tub) or bidet is soothing, and can be useful for cleansing the area after bowel movements.     3. To avoid constipation, take two tablespoons of natural wheat bran, natural oat bran, flax, Benefiber or any over the counter fiber supplement and increase your water intake to 7-8 glasses daily.    4. Unless you have been prescribed anorectal medication, do not put anything inside your rectum for two weeks: No suppositories, enemas, fingers, etc.  5. Occasionally, you may have more bleeding than usual after the banding procedure.  This is often from the untreated hemorrhoids rather than the treated one.  Don't be concerned if there is a tablespoon or so of blood.  If there is more blood than this, lie flat with your bottom higher than your head and apply an ice pack to the area. If the bleeding does not stop within a half an hour or if you feel faint, call our office at [phone] or go to the emergency room.  6. Problems are not common; however, if there is a substantial amount of bleeding, severe pain, chills, fever or difficulty passing urine (very rare) or other problems, you should call us at [phone] or report to the nearest emergency room.  7. Do not stay seated continuously for more than 2-3 hours for a day or two  after the procedure.  Tighten your buttock muscles 10-15 times every two hours and take 10-15 deep breaths every 1-2 hours.  Do not spend more than a few minutes on the toilet if you cannot empty your bowel; instead re-visit the toilet at a later time.    Follow up with Dr Gessner as needed.    I appreciate the opportunity to care for you. Carl Gessner, MD, FACG 

## 2017-09-21 NOTE — Progress Notes (Signed)
    Hemorrhoid ligation:  The patient had hemorrhoid banding of all 3 columns of hemorrhoids June 27, 2017.  1 of the bands came off the next day she said.  She has improved prolapse symptoms but has had a recurrence after about a month post banding.  She would like this treated.  Jackolyn Confer, CMA present  Rectal exam demonstrates normal anoderm no protrusions, when she bears down I can see prolapsing hemorrhoid on the right midline.  Digital rectal exam shows no mass slightly reduced rectal tone and a capacious rectum.  There is formed brown stool.  Anoscopy shows a grade 2 right anterior internal hemorrhoid and a grade 3 right posterior internal hemorrhoid.  PROCEDURE NOTE: The patient presents with symptomatic grade 2-3 hemorrhoids, requesting rubber band ligation of his/her hemorrhoidal disease.  All risks, benefits and alternative forms of therapy were described and informed consent was obtained.   The anorectum was pre-medicated with 0.125% nitroglycerin and 5% lidocaine The decision was made to band the right posterior and right anterior internal hemorrhoids, and the Raymond was used to perform band ligation without complication.  Digital anorectal examination was then performed to assure proper positioning of the band, and to adjust the banded tissue as required.  The patient was discharged home without pain or other issues.  Dietary and behavioral recommendations were given and along with follow-up instructions.      The patient will return as needed for  follow-up and possible additional banding as required. No complications were encountered and the patient tolerated the procedure well.  I appreciate the opportunity to care for this patient. CC: Dorena Cookey, MD

## 2017-10-31 ENCOUNTER — Telehealth: Payer: Self-pay | Admitting: Internal Medicine

## 2017-10-31 NOTE — Telephone Encounter (Signed)
Wants to discuss hem banding.  Appt scehduled with her for 11/08/17

## 2017-11-08 ENCOUNTER — Encounter: Payer: Self-pay | Admitting: Internal Medicine

## 2017-11-08 ENCOUNTER — Ambulatory Visit: Payer: Medicare Other | Admitting: Internal Medicine

## 2017-11-08 DIAGNOSIS — K642 Third degree hemorrhoids: Secondary | ICD-10-CM

## 2017-11-08 NOTE — Patient Instructions (Addendum)
HEMORRHOID BANDING PROCEDURE    FOLLOW-UP CARE   1. The procedure you have had should have been relatively painless since the banding of the area involved does not have nerve endings and there is no pain sensation.  The rubber band cuts off the blood supply to the hemorrhoid and the band may fall off as soon as 48 hours after the banding (the band may occasionally be seen in the toilet bowl following a bowel movement). You may notice a temporary feeling of fullness in the rectum which should respond adequately to plain Tylenol or Motrin.  2. Following the banding, avoid strenuous exercise that evening and resume full activity the next day.  A sitz bath (soaking in a warm tub) or bidet is soothing, and can be useful for cleansing the area after bowel movements.     3. To avoid constipation, take two tablespoons of natural wheat bran, natural oat bran, flax, Benefiber or any over the counter fiber supplement and increase your water intake to 7-8 glasses daily.    4. Unless you have been prescribed anorectal medication, do not put anything inside your rectum for two weeks: No suppositories, enemas, fingers, etc.  5. Occasionally, you may have more bleeding than usual after the banding procedure.  This is often from the untreated hemorrhoids rather than the treated one.  Don't be concerned if there is a tablespoon or so of blood.  If there is more blood than this, lie flat with your bottom higher than your head and apply an ice pack to the area. If the bleeding does not stop within a half an hour or if you feel faint, call our office at [phone] or go to the emergency room.  6. Problems are not common; however, if there is a substantial amount of bleeding, severe pain, chills, fever or difficulty passing urine (very rare) or other problems, you should call us at [phone] or report to the nearest emergency room.  7. Do not stay seated continuously for more than 2-3 hours for a day or two  after the procedure.  Tighten your buttock muscles 10-15 times every two hours and take 10-15 deep breaths every 1-2 hours.  Do not spend more than a few minutes on the toilet if you cannot empty your bowel; instead re-visit the toilet at a later time.    Please follow up with Dr Carlean Purl on 01/04/18 at 3:00pm. Cancel if better.    I appreciate the opportunity to care for you. Silvano Rusk, MD, [facility]

## 2017-11-08 NOTE — Progress Notes (Signed)
  HEMORRHOID LIGATION  SXS: recurrent prolapse and bleeding and some leakage - RA/RP banded x 2 and LL x 1 2019  bowels are reg w/o straining Jackolyn Confer, CMA present  RECTAL: NL   ANOSCOPY: Gr 2 inflamed RA/RP internal hemorrhoids Gr 1 LL not inflamed  PROCEDURE NOTE: The patient presents with symptomatic grade 2  hemorrhoids, requesting rubber band ligation of his/her hemorrhoidal disease.  All risks, benefits and alternative forms of therapy were described and informed consent was obtained.   The anorectum was pre-medicated with 0.125% NTG and 5% Lidocaine topical The decision was made to band the RA/RP internal hemorrhoidw, and the [place] was used to perform band ligation without complication.  Digital anorectal examination was then performed to assure proper positioning of the band, and to adjust the banded tissue as required.  The patient was discharged home without pain or other issues.  Dietary and behavioral recommendations were given and along with follow-up instructions.       The patient will return in Sept 2019 - may cancel if ok -  for  follow-up and possible additional banding as required. No complications were encountered and the patient tolerated the procedure well.   I appreciate the opportunity to care for this patient.  Cc: Dorena Cookey, MD

## 2017-11-08 NOTE — Assessment & Plan Note (Signed)
RA/RP banded 3rd time RTC Sept 2019 unless all better will cancel

## 2017-11-15 LAB — HM DEXA SCAN

## 2017-11-24 ENCOUNTER — Encounter: Payer: Self-pay | Admitting: Family Medicine

## 2017-12-08 ENCOUNTER — Other Ambulatory Visit: Payer: Self-pay | Admitting: Family Medicine

## 2017-12-08 DIAGNOSIS — J4520 Mild intermittent asthma, uncomplicated: Secondary | ICD-10-CM

## 2017-12-08 DIAGNOSIS — R05 Cough: Secondary | ICD-10-CM

## 2017-12-08 DIAGNOSIS — R059 Cough, unspecified: Secondary | ICD-10-CM

## 2017-12-16 ENCOUNTER — Telehealth: Payer: Self-pay

## 2017-12-16 MED ORDER — HYDROCODONE-HOMATROPINE 5-1.5 MG/5ML PO SYRP: 5.0000 mL | ORAL_SOLUTION | ORAL | 0 refills | Status: DC | PRN

## 2017-12-16 NOTE — Telephone Encounter (Signed)
This was sent in  

## 2017-12-16 NOTE — Telephone Encounter (Signed)
Pt came in to office with bottle of Hydromet and is asking for refill. She reports cough and wheeze x1.5 wks, mild shob and no fever. She states she "gets this way sometimes". She does use Symbicort BID and has not used her albuterol neb, which I recommended she use for wheeze/shob.   Pt has 6am flight on Monday and would like this today if possible. Advised pt Dr. Sherren Mocha is out of office and message would be sent to another provider for recommendations.   Refill on Hydromet, last rx on bottle 09/30/16 JTodd - not in chart.  Pharmacy: [facility]   Dr. Sarajane Jews - Please advise if you could refill this. Thanks!

## 2017-12-16 NOTE — Telephone Encounter (Signed)
Pt notified. Advised to contact office if not improving or sx worsen. Pt voiced understanding.

## 2018-01-04 ENCOUNTER — Ambulatory Visit: Payer: Medicare Other | Admitting: Internal Medicine

## 2018-01-04 ENCOUNTER — Encounter: Payer: Self-pay | Admitting: Internal Medicine

## 2018-01-04 DIAGNOSIS — K642 Third degree hemorrhoids: Secondary | ICD-10-CM | POA: Diagnosis not present

## 2018-01-04 NOTE — Patient Instructions (Signed)
HEMORRHOID BANDING PROCEDURE    FOLLOW-UP CARE   1. The procedure you have had should have been relatively painless since the banding of the area involved does not have nerve endings and there is no pain sensation.  The rubber band cuts off the blood supply to the hemorrhoid and the band may fall off as soon as 48 hours after the banding (the band may occasionally be seen in the toilet bowl following a bowel movement). You may notice a temporary feeling of fullness in the rectum which should respond adequately to plain Tylenol or Motrin.  2. Following the banding, avoid strenuous exercise that evening and resume full activity the next day.  A sitz bath (soaking in a warm tub) or bidet is soothing, and can be useful for cleansing the area after bowel movements.     3. To avoid constipation, take two tablespoons of natural wheat bran, natural oat bran, flax, Benefiber or any over the counter fiber supplement and increase your water intake to 7-8 glasses daily.    4. Unless you have been prescribed anorectal medication, do not put anything inside your rectum for two weeks: No suppositories, enemas, fingers, etc.  5. Occasionally, you may have more bleeding than usual after the banding procedure.  This is often from the untreated hemorrhoids rather than the treated one.  Don't be concerned if there is a tablespoon or so of blood.  If there is more blood than this, lie flat with your bottom higher than your head and apply an ice pack to the area. If the bleeding does not stop within a half an hour or if you feel faint, call our office at [phone] or go to the emergency room.  6. Problems are not common; however, if there is a substantial amount of bleeding, severe pain, chills, fever or difficulty passing urine (very rare) or other problems, you should call us at [phone] or report to the nearest emergency room.  7. Do not stay seated continuously for more than 2-3 hours for a day or two  after the procedure.  Tighten your buttock muscles 10-15 times every two hours and take 10-15 deep breaths every 1-2 hours.  Do not spend more than a few minutes on the toilet if you cannot empty your bowel; instead re-visit the toilet at a later time.    Please follow up with Dr Gessner in 2 months.    I appreciate the opportunity to care for you. Carl Gessner, MD, FACG  

## 2018-01-04 NOTE — Progress Notes (Signed)
Courtney [name] 72 y.o. 09-Oct-1945 [id number]  Assessment & Plan:   Prolapsed internal hemorrhoids, grade 3 max RA and RP banded again Hopefully will get longer-term results this time RTC 2 mos may cancel if desired   I appreciate the opportunity to care for this patient. AS:NKNL, Jory Ee, MD    Subjective:   Chief Complaint: hemorrhoid problems again  HPI After last banding (#3) of RA and RP in Feb 2019 she improved for a while but despite defecation w/o straining she has recurrent rectal bleeding and fecal seepage. She has been having more coughing lately and has been restarted on prednisone and uptick in hemorrhoid sxs correlates.  No Known Allergies Current Meds  Medication Sig  . amLODipine (NORVASC) 5 MG tablet TAKE 1 TABLET(5 MG) BY MOUTH DAILY  . aspirin 81 MG tablet Take 81 mg by mouth daily.    . budesonide-formoterol (SYMBICORT) 160-4.5 MCG/ACT inhaler INHALE 1 TO 2 PUFFS EVERY 12 HOURS AS NEEDED. GARGLE AND SPIT AFTER USE  . calcium citrate-vitamin D (CITRACAL+D) 315-200 MG-UNIT per tablet Take 1 tablet by mouth daily.  . Cholecalciferol (VITAMIN D3) 1000 UNITS CAPS Take by mouth daily.    . clobetasol (TEMOVATE) 0.05 % external solution Apply 1 application topically at bedtime as needed (1-2 weeks).  Marland Kitchen co-enzyme Q-10 30 MG capsule Take 50 mg by mouth daily.   Marland Kitchen FINACEA 15 % cream Apply 1 application topically 2 (two) times daily. For rosacea  . Glucosamine Sulfate 500 MG CAPS Take 3 capsules by mouth daily.  Marland Kitchen HYDROcodone-homatropine (HYDROMET) 5-1.5 MG/5ML syrup Take 5 mLs by mouth every 4 (four) hours as needed.  Marland Kitchen losartan-hydrochlorothiazide (HYZAAR) 100-12.5 MG tablet TAKE 1 TABLET BY MOUTH DAILY  . Misc Natural Products (TART CHERRY ADVANCED PO) Take 2,400 mg by mouth daily.  . montelukast (SINGULAIR) 10 MG tablet Take 1 tablet (10 mg total) by mouth at bedtime.  . Multiple Vitamins-Minerals (MULTIVITAMIN WITH MINERALS) tablet Take 2 tablets by mouth  daily.    . OMEGA 3 1000 MG CAPS Take 1,000 mg by mouth daily.    Marland Kitchen omeprazole (PRILOSEC) 20 MG capsule TAKE 1 CAPSULE BY MOUTH TWICE DAILY BEFORE A MEAL  . oxymetazoline (AFRIN) 0.05 % nasal spray Place 3 sprays into both nostrils at bedtime.  . predniSONE (DELTASONE) 20 MG tablet Take 20 mg by mouth daily with breakfast.  . psyllium (METAMUCIL) 58.6 % powder Take 1 packet by mouth daily.  Marland Kitchen tretinoin (RETIN-A) 0.025 % cream Apply topically at bedtime.   Current Facility-Administered Medications for the 01/04/18 encounter (Procedure visit) with Gatha Mayer, MD  Medication  . albuterol (PROVENTIL) (2.5 MG/3ML) 0.083% nebulizer solution 2.5 mg   Past Medical History:  Diagnosis Date  . Breast cancer [facility]) July 2012   post double mastectomy; Dr Humphrey Rolls  . Diverticulosis   . Family history of breast cancer   . GERD (gastroesophageal reflux disease)    S/P dilation X 1  . Hypertension   . Night sweats   . OSA (obstructive sleep apnea) 08/03/2017   Very mild with AHI 7.2/hr.    . Prolapsed internal hemorrhoids, grade 3 06/27/2017   Grade 2 on anoscopy exam but grade 3 by history and seen a colonoscopy as well All 3 columns banded 06/27/2017  09/21/2017 banded RP and RA    . Reactive airway disease    Past Surgical History:  Procedure Laterality Date  . ABDOMINAL HYSTERECTOMY    . bilateral breast reconstruction  Jan  2013  . BREAST SURGERY  01/13/11   bilateral total mastectomy; Dr Dalbert Batman  . COLONOSCOPY  2014   Dr Olevia Perches; hyperplastic polyp  . ESOPHAGEAL DILATION  2014  . HEMORRHOID BANDING  2019   February and May  . NASAL SEPTUM SURGERY    . RECONSTRUCTION / CORRECTION OF NIPPLE / Tresa Moore  June 2013  . THYROGLOSSAL DUCT CYST     Social History   Social History Narrative   Married   2-3 glasses wine/day   remote small vol smmoker   family history includes Breast cancer in her cousin and other; Breast cancer (age of onset: 24) in her mother; Cancer (age of onset: 16) in her  sister; Cervical cancer (age of onset: 98) in her mother; Colon cancer (age of onset: 6) in her mother; Diabetes in her father; Heart attack in her maternal uncle, paternal grandfather, paternal uncle, and paternal uncle; Heart attack (age of onset: 78) in her maternal grandfather; Heart attack (age of onset: 58) in her father; Parkinson's disease in her maternal grandmother; Skin cancer in her father.   Review of Systems   Objective:   Physical Exam BP 114/60   Pulse 80   Ht 4\' 8"  (1.422 m)   Wt 117 lb (53.1 kg)   BMI 26.23 kg/m   Tinnie Gens CMA present  Rectal nontender and no mass  Anoscopy demonstrates Gr 2 inflamed RA/RP internal hemorrhoids  PROCEDURE NOTE: The patient presents with symptomatic grade 2 hemorrhoids, requesting rubber band ligation of his/her hemorrhoidal disease.  All risks, benefits and alternative forms of therapy were described and informed consent was obtained.   The anorectum was pre-medicated with 0.125% NTG and 5% lidocaine The decision was made to band the RA and RP internal hemorrhoids, and the [place] was used to perform band ligation without complication.  Digital anorectal examination was then performed to assure proper positioning of the band, and to adjust the banded tissue as required.  The patient was discharged home without pain or other issues.  Dietary and behavioral recommendations were given and along with follow-up instructions.      The patient will return in about 2 mos for  follow-up and possible additional banding as required. No complications were encountered and the patient tolerated the procedure well.

## 2018-01-07 ENCOUNTER — Encounter: Payer: Self-pay | Admitting: Internal Medicine

## 2018-01-07 NOTE — Assessment & Plan Note (Addendum)
RA and RP banded again Hopefully will get longer-term results this time RTC 2 mos may cancel if desired

## 2018-01-10 ENCOUNTER — Encounter: Payer: Self-pay | Admitting: Family Medicine

## 2018-01-10 ENCOUNTER — Ambulatory Visit: Payer: Medicare Other | Admitting: Family Medicine

## 2018-01-10 VITALS — BP 110/62 | HR 76 | Temp 98.6°F | Wt 118.3 lb

## 2018-01-10 DIAGNOSIS — J4521 Mild intermittent asthma with (acute) exacerbation: Secondary | ICD-10-CM

## 2018-01-10 DIAGNOSIS — R062 Wheezing: Secondary | ICD-10-CM

## 2018-01-10 MED ORDER — ALBUTEROL SULFATE (2.5 MG/3ML) 0.083% IN NEBU: 2.5000 mg | INHALATION_SOLUTION | Freq: Four times a day (QID) | RESPIRATORY_TRACT | 1 refills | Status: DC | PRN

## 2018-01-10 MED ORDER — DOXYCYCLINE HYCLATE 100 MG PO TABS: 100.0000 mg | ORAL_TABLET | Freq: Two times a day (BID) | ORAL | 1 refills | Status: DC

## 2018-01-10 NOTE — Patient Instructions (Signed)
Nebulizer with albuterol............ In the morning and at bedtime  Symbicort........Marland Kitchen 1 puff twice daily after the nebulizer treatment  Prednisone 30 mg daily  Doxycycline.......... One twice daily for 2 weeks  Return next Tuesday for follow-up at 9 AM  Anti-reflex measures........... Take 2 Prilosec before breakfast.........Marland Kitchen And one prior to your evening meal

## 2018-01-10 NOTE — Progress Notes (Signed)
Courtney [name] is a 72 year old married female nonsmoker who comes in today as an acute for evaluation of asthma  She had a flare of her asthma in February 2018 that lasted for about 6 weeks. She took a burst and taper prednisone and her symptoms resolved. Since that time she's been on her Symbicortdaily and doing well until August 7 when she began coughing and wheezing for no apparent reason. She began with 40 mg prednisone for 3 days and tapered however by August 16 she was not any better she therefore went back up to one prednisone tablet daily and is been on that since that time. She's had no fever some mild sputum production but no yellow production. No chills etc. She's using her nebulizer at bedtime  Review of systems otherwise negative  BP 110/62 (BP Location: Left Arm, Patient Position: Sitting, Cuff Size: Normal)   Pulse 76   Temp 98.6 F (37 C) (Oral)   Wt 118 lb 5 oz (53.7 kg)   SpO2 97%   BMI 26.53 kg/m  Well-developed well-nourished female no acute distress vital signs stable she's afebrile lung exam shows symmetrical breath sounds late mild expiratory wheezing bilaterally. No crackles.  #1 asthma prolonged.......... Increased prednisone from 20 mg a day to 30.......Marland Kitchen Add doxycycline twice a day for 2 weeks........Marland Kitchen Anti-reflex measures....... Nebulizer twice a day........ Follow-up in 5 days

## 2018-01-16 ENCOUNTER — Telehealth: Payer: Self-pay | Admitting: Internal Medicine

## 2018-01-17 ENCOUNTER — Ambulatory Visit (INDEPENDENT_AMBULATORY_CARE_PROVIDER_SITE_OTHER): Payer: Medicare Other | Admitting: Family Medicine

## 2018-01-17 ENCOUNTER — Encounter: Payer: Self-pay | Admitting: Family Medicine

## 2018-01-17 VITALS — BP 108/68 | HR 66 | Temp 98.9°F | Ht <= 58 in | Wt 115.5 lb

## 2018-01-17 DIAGNOSIS — K219 Gastro-esophageal reflux disease without esophagitis: Secondary | ICD-10-CM | POA: Diagnosis not present

## 2018-01-17 DIAGNOSIS — Z0000 Encounter for general adult medical examination without abnormal findings: Secondary | ICD-10-CM

## 2018-01-17 DIAGNOSIS — I10 Essential (primary) hypertension: Secondary | ICD-10-CM | POA: Diagnosis not present

## 2018-01-17 DIAGNOSIS — J4521 Mild intermittent asthma with (acute) exacerbation: Secondary | ICD-10-CM

## 2018-01-17 DIAGNOSIS — C50911 Malignant neoplasm of unspecified site of right female breast: Secondary | ICD-10-CM | POA: Diagnosis not present

## 2018-01-17 DIAGNOSIS — Z170 Estrogen receptor positive status [ER+]: Secondary | ICD-10-CM

## 2018-01-17 LAB — HEPATIC FUNCTION PANEL
ALBUMIN: 4.3 g/dL (ref 3.5–5.2)
ALK PHOS: 34 U/L — AB (ref 39–117)
ALT: 42 U/L — ABNORMAL HIGH (ref 0–35)
AST: 22 U/L (ref 0–37)
Bilirubin, Direct: 0.2 mg/dL (ref 0.0–0.3)
TOTAL PROTEIN: 6.7 g/dL (ref 6.0–8.3)
Total Bilirubin: 1.1 mg/dL (ref 0.2–1.2)

## 2018-01-17 LAB — POCT URINALYSIS DIPSTICK
Glucose, UA: NEGATIVE
Ketones, UA: NEGATIVE
LEUKOCYTES UA: NEGATIVE
NITRITE UA: NEGATIVE
Odor: NEGATIVE
PROTEIN UA: NEGATIVE
RBC UA: NEGATIVE
Urobilinogen, UA: 0.2 E.U./dL
pH, UA: 6 (ref 5.0–8.0)

## 2018-01-17 LAB — CBC WITH DIFFERENTIAL/PLATELET
BASOS ABS: 0.1 10*3/uL (ref 0.0–0.1)
Basophils Relative: 0.8 % (ref 0.0–3.0)
Eosinophils Absolute: 0.1 10*3/uL (ref 0.0–0.7)
Eosinophils Relative: 1.5 % (ref 0.0–5.0)
HCT: 39.6 % (ref 36.0–46.0)
Hemoglobin: 13.3 g/dL (ref 12.0–15.0)
Lymphocytes Relative: 7.1 % — ABNORMAL LOW (ref 12.0–46.0)
Lymphs Abs: 0.6 10*3/uL — ABNORMAL LOW (ref 0.7–4.0)
MCHC: 33.5 g/dL (ref 30.0–36.0)
MCV: 91.6 fl (ref 78.0–100.0)
MONO ABS: 0.5 10*3/uL (ref 0.1–1.0)
MONOS PCT: 5.3 % (ref 3.0–12.0)
Neutro Abs: 7.3 10*3/uL (ref 1.4–7.7)
Platelets: 249 10*3/uL (ref 150.0–400.0)
RBC: 4.33 Mil/uL (ref 3.87–5.11)
RDW: 14.5 % (ref 11.5–15.5)
WBC: 8.6 10*3/uL (ref 4.0–10.5)

## 2018-01-17 LAB — BASIC METABOLIC PANEL
BUN: 25 mg/dL — ABNORMAL HIGH (ref 6–23)
CALCIUM: 9.8 mg/dL (ref 8.4–10.5)
CO2: 27 mEq/L (ref 19–32)
CREATININE: 0.84 mg/dL (ref 0.40–1.20)
Chloride: 102 mEq/L (ref 96–112)
GFR: 70.43 mL/min (ref >60.00)
Glucose, Bld: 103 mg/dL — ABNORMAL HIGH (ref 70–99)
Potassium: 4.3 mEq/L (ref 3.5–5.1)
Sodium: 142 mEq/L (ref 135–145)

## 2018-01-17 LAB — LIPID PANEL
Cholesterol: 214 mg/dL — ABNORMAL HIGH (ref 0–200)
HDL: 90.8 mg/dL (ref >39.00)
LDL Cholesterol: 107 mg/dL — ABNORMAL HIGH (ref 0–99)
NonHDL: 123.03
TRIGLYCERIDES: 79 mg/dL (ref 0.0–149.0)
Total CHOL/HDL Ratio: 2
VLDL: 15.8 mg/dL (ref 0.0–40.0)

## 2018-01-17 LAB — TSH: TSH: 1.5 u[IU]/mL (ref 0.35–4.50)

## 2018-01-17 MED ORDER — LOSARTAN POTASSIUM-HCTZ 100-12.5 MG PO TABS: 1.0000 | ORAL_TABLET | Freq: Every day | ORAL | 4 refills | Status: DC

## 2018-01-17 MED ORDER — AMLODIPINE BESYLATE 5 MG PO TABS: ORAL_TABLET | ORAL | 4 refills | Status: DC

## 2018-01-17 MED ORDER — OMEPRAZOLE 20 MG PO CPDR: DELAYED_RELEASE_CAPSULE | ORAL | 4 refills | Status: DC

## 2018-01-17 MED ORDER — BUDESONIDE-FORMOTEROL FUMARATE 160-4.5 MCG/ACT IN AERO: INHALATION_SPRAY | RESPIRATORY_TRACT | 4 refills | Status: DC

## 2018-01-17 MED ORDER — BUDESONIDE-FORMOTEROL FUMARATE 160-4.5 MCG/ACT IN AERO: INHALATION_SPRAY | RESPIRATORY_TRACT | 11 refills | Status: DC

## 2018-01-17 MED ORDER — MONTELUKAST SODIUM 10 MG PO TABS: 10.0000 mg | ORAL_TABLET | Freq: Every day | ORAL | 4 refills | Status: DC

## 2018-01-17 MED ORDER — HYDROCODONE-HOMATROPINE 5-1.5 MG/5ML PO SYRP: 5.0000 mL | ORAL_SOLUTION | ORAL | 0 refills | Status: DC | PRN

## 2018-01-17 MED ORDER — PREDNISONE 20 MG PO TABS: 20.0000 mg | ORAL_TABLET | Freq: Every day | ORAL | 1 refills | Status: DC

## 2018-01-17 NOTE — Telephone Encounter (Signed)
Informed patient we do not have a schedule out for November but to call back in a few weeks to check to see if the schedule has came out. Patient agreed and verbalized understanding.

## 2018-01-17 NOTE — Patient Instructions (Signed)
Begin to taper the prednisone slowly............ 20 mg a day for 5 days, 15 mg a day for 5 days, 10 mg a day for 5 days, then 7.5 mg a day for 5 days, then 5 mg a day for 5 days, then 5 mg Monday Wednesday Friday for 3 weeks  Finish the doxycycline  Decrease the Prilosec to 1 twice daily  Labs today............ I will call if there is anything abnormal  After he finished the prednisone taper then come in for your flu shot.  You may also get the second shingles vaccine after you are off the prednisone

## 2018-01-17 NOTE — Progress Notes (Signed)
Courtney [name] is a 72 year old married female non-smoker who comes in today for general physical examination  She has a history of chronic intermittent asthma.  She had a flare in August.  She took 20 mg of prednisone for 4 weeks without any improvement.  She came in last week because she was not getting better.  We increased her prednisone to 30 mg a day because she was still wheezing.  We reinstituted her reflux treatment program despite the fact that she is asymptomatic.  We also gave her doxycycline 100 mg twice daily........... this was done empirically.  She had no fever chills etc.  She states today she is much improved she feels almost 100% back to normal.  She sees a dermatologist once or twice yearly because of a history of dysplastic nevi  She has been seen in pulmonary because of mild sleep apnea.  She had a mouthguard made by her dentist and with the mouthguard she stopped snoring.  The going to do a repeat sleep study with and without the mouthguard.  She gets routine eye care, dental care, no longer needs mammography because she has had both breasts removed September 2012 because of breast cancer.  She has had no problems since her surgery and does not see oncology anymore.  She had a colonoscopy 2018 which was normal.  She said hemorrhoids ligated on a couple occasions by Dr. Arelia Longest.  She now has another hemorrhoid that is bothering her.  The pain less.  Vaccinations up-to-date she is due a shingles vaccine #2 and a flu shot however she cannot have these because she is on the prednisone.  14 point review of systems reviewed and otherwise negative except for postnasal drip.  Advised to switch from Zyrtec to [employer].  She also has underlying hypertension for which she takes Norvasc 5 mg daily in the morning along with losartan 100-12 0.5 daily.  BP today 108/68.  She states she is not lightheaded when she stands up.  She is also treated with multiple medications from her dermatologist  because of rosacea.  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 she does have a healthcare power of attorney and living will.  BP 108/68 (BP Location: Left Arm, Patient Position: Sitting, Cuff Size: Normal)   Pulse 66   Temp 98.9 F (37.2 C) (Oral)   Ht 4' 8.25" (1.429 m)   Wt 115 lb 8 oz (52.4 kg)   SpO2 94%   BMI 25.66 kg/m  Well-developed nourished female no acute distress vital signs stable she is afebrile HEENT are negative except for very small faint cataracts bilaterally.  Neck was supple thyroid is not enlarged no carotid bruits.  Cardiopulmonary exam was normal.  Breast exam shows scars from previous mastectomy with subsequent implants.  No palpable abnormalities.  Abdominal exam was negative  Extremities normal skin normal peripheral pulses normal.  1.  Hypertension at goal......... continue current therapy check labs  2.  Asthma......... asymptomatic on the above treatment program.......... taper prednisone over a couple months since he was on 20 mg a day for 4 weeks.....Marland Kitchen advised in the future if she has a flare and does not improve did not take the prednisone but to call us immediately  3.  Allergic rhinitis....... continue Singulair DC Zantac start Allegra plain  nightly  4.  Reflux esophagitis....... Prilosec in the morning......Marland Kitchen Zantac for evening meal as outlined by pulmonary  5.  Status post breast cancer with bilateral mastectomies and implants......... asymptomatic  6.  Rosacea............ followed by dermatology  7.  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  Internal hemorrhoids......... continue treatment program as outlined by GI

## 2018-01-30 ENCOUNTER — Telehealth: Payer: Self-pay | Admitting: Family Medicine

## 2018-01-30 NOTE — Telephone Encounter (Signed)
Copied from [place] [id number]. Topic: Quick Communication - Rx Refill/Question >> Jan 30, 2018  4:55 PM Sheppard Coil, [employer] wrote: Medication: predniSONE (DELTASONE) 20 MG tablet  Pt states that she needs 5mg  tablets sent in.  States she is on a taper dose and needs to actually have 7.5mg  and she can accomplish that with the 5mg  tablets, not the 20mg  tablets  Has the patient contacted their pharmacy? No - needs different dose (Agent: If no, request that the patient contact the pharmacy for the refill.) (Agent: If yes, when and what did the pharmacy advise?)  Preferred Pharmacy (with phone number or street name): [id number] - [place], [place] [phone] (Phone) [phone] (Fax)  Agent: Please be advised that RX refills may take up to 3 business days. We ask that you follow-up with your pharmacy.

## 2018-01-31 ENCOUNTER — Telehealth: Payer: Self-pay | Admitting: Family Medicine

## 2018-01-31 ENCOUNTER — Other Ambulatory Visit: Payer: Self-pay | Admitting: Family Medicine

## 2018-01-31 MED ORDER — PREDNISONE 5 MG PO TABS: ORAL_TABLET | ORAL | 1 refills | Status: DC

## 2018-01-31 NOTE — Telephone Encounter (Signed)
Please advise 

## 2018-01-31 NOTE — Telephone Encounter (Signed)
Copied from [place] [id number]. Topic: Quick Communication - See Telephone Encounter >> Jan 31, 2018  3:38 PM Vernona Rieger wrote: CRM for notification. See Telephone encounter for: 01/31/18.  [facility] needs clarification on directions for predniSONE (DELTASONE) 5 MG tablet. Please advise

## 2018-01-31 NOTE — Telephone Encounter (Signed)
Direction given. "Take 1 tab daily with breakfast."

## 2018-03-07 ENCOUNTER — Other Ambulatory Visit: Payer: Self-pay | Admitting: Family Medicine

## 2018-03-07 ENCOUNTER — Ambulatory Visit (INDEPENDENT_AMBULATORY_CARE_PROVIDER_SITE_OTHER): Payer: Medicare Other

## 2018-03-07 DIAGNOSIS — Z23 Encounter for immunization: Secondary | ICD-10-CM

## 2018-03-10 ENCOUNTER — Telehealth: Payer: Self-pay | Admitting: Family Medicine

## 2018-03-10 MED ORDER — HYDROCHLOROTHIAZIDE 12.5 MG PO CAPS: 12.5000 mg | ORAL_CAPSULE | Freq: Every day | ORAL | 3 refills | Status: DC

## 2018-03-10 MED ORDER — LOSARTAN POTASSIUM 100 MG PO TABS: 100.0000 mg | ORAL_TABLET | Freq: Every day | ORAL | 3 refills | Status: DC

## 2018-03-10 NOTE — Telephone Encounter (Signed)
Copied from [place] [id number]. Topic: Quick Communication - Rx Refill/Question >> Mar 10, 2018  8:54 AM Scherrie Gerlach wrote: Medication: losartan-hydrochlorothiazide (HYZAAR) 100-12.5 MG tablet [facility] advised this Rx is on manufacturer backorder. Pt will need this Rx sent in 2 separate meds  [facility] [place], [place] [phone] (Phone) [phone] (Fax)

## 2018-03-10 NOTE — Telephone Encounter (Signed)
Completed.

## 2018-03-13 ENCOUNTER — Other Ambulatory Visit: Payer: Self-pay | Admitting: Family Medicine

## 2018-03-13 ENCOUNTER — Ambulatory Visit: Payer: Medicare Other | Admitting: Internal Medicine

## 2018-03-13 ENCOUNTER — Encounter: Payer: Self-pay | Admitting: Internal Medicine

## 2018-03-13 DIAGNOSIS — K642 Third degree hemorrhoids: Secondary | ICD-10-CM

## 2018-03-13 MED ORDER — HYDROCHLOROTHIAZIDE 12.5 MG PO CAPS: 12.5000 mg | ORAL_CAPSULE | Freq: Every day | ORAL | 4 refills | Status: DC

## 2018-03-13 MED ORDER — LOSARTAN POTASSIUM 100 MG PO TABS: 100.0000 mg | ORAL_TABLET | Freq: Every day | ORAL | 3 refills | Status: DC

## 2018-03-13 NOTE — Assessment & Plan Note (Addendum)
Banded again at RA/RP area but more medial and lower - had good tissue purchase - hope this finally works for her RTC if fails - would reassess with anoscopy - looks like 2 small inflammatory polyps/nodules in RP/RA anal area and would re-look at those - if persistent problems and persistent findings would then ask her to see a surgeon most likely

## 2018-03-13 NOTE — Patient Instructions (Signed)
  HEMORRHOID BANDING PROCEDURE    FOLLOW-UP CARE   1. The procedure you have had should have been relatively painless since the banding of the area involved does not have nerve endings and there is no pain sensation.  The rubber band cuts off the blood supply to the hemorrhoid and the band may fall off as soon as 48 hours after the banding (the band may occasionally be seen in the toilet bowl following a bowel movement). You may notice a temporary feeling of fullness in the rectum which should respond adequately to plain Tylenol or Motrin.  2. Following the banding, avoid strenuous exercise that evening and resume full activity the next day.  A sitz bath (soaking in a warm tub) or bidet is soothing, and can be useful for cleansing the area after bowel movements.     3. To avoid constipation, take two tablespoons of natural wheat bran, natural oat bran, flax, Benefiber or any over the counter fiber supplement and increase your water intake to 7-8 glasses daily.    4. Unless you have been prescribed anorectal medication, do not put anything inside your rectum for two weeks: No suppositories, enemas, fingers, etc.  5. Occasionally, you may have more bleeding than usual after the banding procedure.  This is often from the untreated hemorrhoids rather than the treated one.  Don't be concerned if there is a tablespoon or so of blood.  If there is more blood than this, lie flat with your bottom higher than your head and apply an ice pack to the area. If the bleeding does not stop within a half an hour or if you feel faint, call our office at [phone] or go to the emergency room.  6. Problems are not common; however, if there is a substantial amount of bleeding, severe pain, chills, fever or difficulty passing urine (very rare) or other problems, you should call us at [phone] or report to the nearest emergency room.  7. Do not stay seated continuously for more than 2-3 hours for a day or  two after the procedure.  Tighten your buttock muscles 10-15 times every two hours and take 10-15 deep breaths every 1-2 hours.  Do not spend more than a few minutes on the toilet if you cannot empty your bowel; instead re-visit the toilet at a later time.    If your not better in 6 weeks call us back.   Use Benefiber if needed.   I appreciate the opportunity to care for you. Silvano Rusk, MD, [facility]

## 2018-03-13 NOTE — Progress Notes (Signed)
   HEMORRHOID LIGATION:  SXS: rectal bleeding and fecal seepage, prolapse  Last banded RA/RP in 01/2018 - band(s) fell off 1 day after banding  She desires additional banding  Julieanne Cotton, CMA present  Rectal: NL anoderm, slight bulge seen R anal area with valsalva  ANOSCOPY: Gr 2 RA/RP complex with 2 inflammatory nodules seen  PROCEDURE NOTE: The patient presents with symptomatic grade 2-3 hemorrhoids, requesting rubber band ligation of his/her hemorrhoidal disease.  All risks, benefits and alternative forms of therapy were described and informed consent was obtained.   The anorectum was pre-medicated with 0.125% NTG and 5% lidocaine The decision was made to band the RA/RP internal hemorrhoids - low banding sites, and the [place] was used to perform band ligation without complication.  Digital anorectal examination was then performed to assure proper positioning of the band, and to adjust the banded tissue as required.  The patient was discharged home without pain or other issues.  Dietary and behavioral recommendations were given and along with follow-up instructions.     The following adjunctive treatments were recommended:  Benefiber temporarily  The patient will return as needed - to call in 6 weeks if not btter for  follow-up and possible additional banding as required. No complications were encountered and the patient tolerated the procedure well.  I appreciate the opportunity to care for her. OT:LXBW, Jory Ee, MD

## 2018-03-16 ENCOUNTER — Other Ambulatory Visit: Payer: Self-pay

## 2018-05-26 ENCOUNTER — Telehealth: Payer: Self-pay | Admitting: Internal Medicine

## 2018-05-26 NOTE — Telephone Encounter (Signed)
Pt call to sched HEMORRHOID BANDING

## 2018-05-29 NOTE — Telephone Encounter (Signed)
Left message for patient to call back  

## 2018-05-30 NOTE — Telephone Encounter (Signed)
Patient has been scheduled to discuss hem banding for 06/12/18 3:00

## 2018-06-12 ENCOUNTER — Ambulatory Visit: Payer: Medicare Other | Admitting: Internal Medicine

## 2018-06-12 ENCOUNTER — Encounter: Payer: Self-pay | Admitting: Internal Medicine

## 2018-06-12 DIAGNOSIS — K642 Third degree hemorrhoids: Secondary | ICD-10-CM

## 2018-06-12 NOTE — Progress Notes (Signed)
   HEMORRHOID LIGATION:  Signs/sxs - recurrent Gr 2-3 prolapsing hemorrhoids despite multiple bandings  Jan Hogan, CMA present  Rectal - no mass, NL DRE, no prolapsing hemorrhoids  ANOSCOPY: Grade 2 RP internal hemorrhoid, Gr 1 LL and RA  PROCEDURE NOTE: The patient presents with symptomatic grade 2-3  hemorrhoids, requesting rubber band ligation of his/her hemorrhoidal disease.  All risks, benefits and alternative forms of therapy were described and informed consent was obtained. We discussed surgical referral due to the numerous bandings and recurrent sxs - she asks that I band again.   The anorectum was pre-medicated with 0.125% NTG The decision was made to band the RP internal hemorrhoid, and the [place] was used to perform band ligation without complication.  2 bands placed RP and R mid areas Digital anorectal examination was then performed to assure proper positioning of the band, and to adjust the banded tissue as required.  The patient was discharged home without pain or other issues.  Dietary and behavioral recommendations were given and along with follow-up instructions.     The following adjunctive treatments were recommended:  Benefiber  The patient will return prn for  follow-up and possible additional banding as required. No complications were encountered and the patient tolerated the procedure well.

## 2018-06-12 NOTE — Patient Instructions (Signed)
  HEMORRHOID BANDING PROCEDURE    FOLLOW-UP CARE   1. The procedure you have had should have been relatively painless since the banding of the area involved does not have nerve endings and there is no pain sensation.  The rubber band cuts off the blood supply to the hemorrhoid and the band may fall off as soon as 48 hours after the banding (the band may occasionally be seen in the toilet bowl following a bowel movement). You may notice a temporary feeling of fullness in the rectum which should respond adequately to plain Tylenol or Motrin.  2. Following the banding, avoid strenuous exercise that evening and resume full activity the next day.  A sitz bath (soaking in a warm tub) or bidet is soothing, and can be useful for cleansing the area after bowel movements.     3. To avoid constipation, take two tablespoons of natural wheat bran, natural oat bran, flax, Benefiber or any over the counter fiber supplement and increase your water intake to 7-8 glasses daily.    4. Unless you have been prescribed anorectal medication, do not put anything inside your rectum for two weeks: No suppositories, enemas, fingers, etc.  5. Occasionally, you may have more bleeding than usual after the banding procedure.  This is often from the untreated hemorrhoids rather than the treated one.  Don't be concerned if there is a tablespoon or so of blood.  If there is more blood than this, lie flat with your bottom higher than your head and apply an ice pack to the area. If the bleeding does not stop within a half an hour or if you feel faint, call our office at [phone] or go to the emergency room.  6. Problems are not common; however, if there is a substantial amount of bleeding, severe pain, chills, fever or difficulty passing urine (very rare) or other problems, you should call us at [phone] or report to the nearest emergency room.  7. Do not stay seated continuously for more than 2-3 hours for a day or  two after the procedure.  Tighten your buttock muscles 10-15 times every two hours and take 10-15 deep breaths every 1-2 hours.  Do not spend more than a few minutes on the toilet if you cannot empty your bowel; instead re-visit the toilet at a later time.    Follow up with Dr Carlean Purl as needed.  Use Benefiber , handout provided.   I appreciate the opportunity to care for you. Silvano Rusk, MD, [facility]

## 2018-06-12 NOTE — Assessment & Plan Note (Signed)
RP/mid R banded RTC prn

## 2018-07-11 DIAGNOSIS — L719 Rosacea, unspecified: Secondary | ICD-10-CM | POA: Insufficient documentation

## 2018-07-11 NOTE — Progress Notes (Signed)
Courtney [name] DOB: June 28, 1945 Encounter date: 07/12/2018  This is a 73 y.o. female who presents to establish care. Chief Complaint  Patient presents with  . Transitions Of Care    History of present illness: Has had shoulder issue since right before Thanksgiving. Has had it worked on by massage therapy and then chiropractor. Picked up [place] wrong way. Has seen Dr. Tamala Julian in past so would like to go back to see him. Pain comes and goes. Sleeping is aggravating if on that side. Using alternative ways to lift things to avoid strain. Not noting weakness.   HTN:norvasc5mg  and losartan 100 and hctz 12.5 Hemorrhoids:Saw GI Dr. OVZ:[id number]$ 06/12/18 for banding. Better than it was, not perfect.  Varicose veins: aches her. Has been to 15/10/20 on [employer]. She is taking a natural vein supplement for vein health and has follow up in a few weeks to have some laser surgery done (after evaluation). Has had 2 appointments with them. bilat calves.  OSA:Had mouthguard made by dentist; plans to repeat sleep study with this. She did complete sleep study and it wasn't good. They are planning to repeat this study for her.  GERD:usually well controlled. Worse with fatty intake. Some spices bother her. Usually takes the omeprazole at night.  Osteopenia: Does exercise on regular basis; yoga, water aerobics. Wants to restart walking now that it is light.  Basal cell skin cancer/rosacea:follows with dermatology: Follows with Dr. 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 Follows with survivor nurse once yearly.  Fasting hyperglycemia: Arthritis:besides right shoulder there are no significant irritated joints.  Mild intermittent asthma: uses symbicort once daily as preventative. Taking singulair as well. Has a lot of drainage, cough at night.   Does go to "natural alternatives" for meds that she takes. (ie calcium, coq10, cherry root)  Past Medical History:  Diagnosis Date  . Breast  cancer [facility]) July 2012   post double mastectomy; Dr August 2012  . Diverticulosis   . Family history of breast cancer   . GERD (gastroesophageal reflux disease)    S/P dilation X 1  . Hemorrhoids   . Hypertension   . Night sweats   . OSA (obstructive sleep apnea) 08/03/2017   Very mild with AHI 7.2/hr.    . Prolapsed internal hemorrhoids, grade 3 06/27/2017   Grade 2 on anoscopy exam but grade 3 by history and seen a colonoscopy as well All 3 columns banded 06/27/2017  09/21/2017 banded RP and RA    . Reactive airway disease    Past Surgical History:  Procedure Laterality Date  . ABDOMINAL HYSTERECTOMY    . bilateral breast reconstruction  Jan 2013  . BREAST SURGERY  01/13/11   bilateral total mastectomy; Dr 22/12/12  . COLONOSCOPY  2014   Dr 2015; hyperplastic polyp  . ESOPHAGEAL DILATION  2014  . HEMORRHOID BANDING  2019   February and May  . NASAL SEPTUM SURGERY    . RECONSTRUCTION / CORRECTION OF NIPPLE / June  June 2013  . THYROGLOSSAL DUCT CYST     No Known Allergies Current Meds  Medication Sig  . albuterol (PROVENTIL) (2.5 MG/3ML) 0.083% nebulizer solution Take 3 mLs (2.5 mg total) by nebulization every 6 (six) hours as needed for wheezing or shortness of breath.  July 2013 amLODipine (NORVASC) 5 MG tablet TAKE 1 TABLET(5 MG) BY MOUTH DAILY  . aspirin 81 MG tablet Take 81 mg by mouth daily.    . budesonide-formoterol (SYMBICORT) 160-4.5 MCG/ACT inhaler INHALE 1 TO  2 PUFFS EVERY 12 HOURS AS NEEDED. GARGLE AND SPIT AFTER USE  . calcium citrate-vitamin D (CITRACAL+D) 315-200 MG-UNIT per tablet Take 1 tablet by mouth daily.  . Cholecalciferol (VITAMIN D3) 1000 UNITS CAPS Take by mouth daily.    Marland Kitchen co-enzyme Q-10 30 MG capsule Take 50 mg by mouth daily.   Marland Kitchen FINACEA 15 % cream Apply 1 application topically 2 (two) times daily. For rosacea  . Glucosamine Sulfate 500 MG CAPS Take 3 capsules by mouth daily.  . hydrochlorothiazide (MICROZIDE) 12.5 MG capsule Take 1 capsule (12.5 mg total) by  mouth daily.  Marland Kitchen loratadine (CLARITIN) 10 MG tablet Take 10 mg by mouth at bedtime.  Marland Kitchen losartan (COZAAR) 100 MG tablet Take 1 tablet (100 mg total) by mouth daily.  . Misc Natural Products (TART CHERRY ADVANCED PO) Take 2,400 mg by mouth daily.  . montelukast (SINGULAIR) 10 MG tablet Take 1 tablet (10 mg total) by mouth at bedtime.  . Multiple Vitamins-Minerals (MULTIVITAMIN WITH MINERALS) tablet Take 2 tablets by mouth daily.    . OMEGA 3 1000 MG CAPS Take 1,000 mg by mouth daily.    Marland Kitchen omeprazole (PRILOSEC) 20 MG capsule TAKE 1 CAPSULE BY MOUTH TWICE DAILY BEFORE A MEAL  . oxymetazoline (AFRIN) 0.05 % nasal spray Place 3 sprays into both nostrils at bedtime.  . psyllium (METAMUCIL) 58.6 % powder Take 1 packet by mouth daily.  Marland Kitchen tretinoin (RETIN-A) 0.025 % cream Apply topically at bedtime.  . [DISCONTINUED] losartan-hydrochlorothiazide (HYZAAR) 100-12.5 MG tablet Take 1 tablet by mouth daily.   Current Facility-Administered Medications for the 07/12/18 encounter (Office Visit) with Caren Macadam, MD  Medication  . albuterol (PROVENTIL) (2.5 MG/3ML) 0.083% nebulizer solution 2.5 mg   Social History   Tobacco Use  . Smoking status: Former Smoker    Years: 5.00    Types: Cigarettes    Last attempt to quit: 05/03/1965    Years since quitting: 53.2  . Smokeless tobacco: Never Used  . Tobacco comment: smoked 1962-1967, less than 1 ppd  Substance Use Topics  . Alcohol use: Yes    Alcohol/week: 14.0 standard drinks    Types: 14 Glasses of wine per week    Comment: 2-3 /day   Family History  Problem Relation Age of Onset  . Breast cancer Mother 59  . Colon cancer Mother 3  . Cervical cancer Mother 23       s/p hysterectomy  . Heart attack Father 77  . Diabetes Father        probable pre DM  . Skin cancer Father        maybe melanoma  . Cancer Sister 42       appendiceal  . Heart attack Maternal Grandfather 48  . Heart attack Paternal Uncle        X2 ; >50y  . Heart attack  Maternal Uncle        d. 65  . Parkinson's disease Maternal Grandmother        d. 76  . Heart attack Paternal Grandfather   . Breast cancer Other        unspecified age; maternal great aunt (MGM's sister)  . Breast cancer Cousin        maternal 1st cousin, once-removed; unspecified age  . Heart attack Paternal Uncle        >50y  . Stroke Neg Hx      Review of Systems  Constitutional: Negative for chills, fatigue and fever.  Respiratory: Negative  for cough, chest tightness, shortness of breath and wheezing.   Cardiovascular: Positive for palpitations (occasionally wakes up at night; lasts 1 minute; resolves on own. Occuring about a year; has seen cardiology and sleep med. Notes reviewed by provider.). Negative for chest pain and leg swelling.  Musculoskeletal: Positive for arthralgias (right shoulder).    Objective:  BP 130/62 (BP Location: Left Arm, Patient Position: Sitting, Cuff Size: Normal)   Pulse 70   Temp 98.5 F (36.9 C) (Oral)   Ht 4\' 9"  (1.448 m)   Wt 116 lb (52.6 kg)   SpO2 95%   BMI 25.10 kg/m   Weight: 116 lb (52.6 kg)   BP Readings from Last 3 Encounters:  07/12/18 130/62  06/12/18 (!) 110/58  03/13/18 130/68   Wt Readings from Last 3 Encounters:  07/12/18 116 lb (52.6 kg)  06/12/18 116 lb (52.6 kg)  03/13/18 119 lb (54 kg)    Physical Exam Constitutional:      General: She is not in acute distress.    Appearance: She is well-developed.  Cardiovascular:     Rate and Rhythm: Normal rate and regular rhythm.     Heart sounds: Normal heart sounds. No murmur. No friction rub.  Pulmonary:     Effort: Pulmonary effort is normal. No respiratory distress.     Breath sounds: Normal breath sounds. No wheezing or rales.  Abdominal:     General: Abdomen is flat. Bowel sounds are normal. There is no distension.     Palpations: Abdomen is soft.     Tenderness: There is no abdominal tenderness.  Musculoskeletal:     Right lower leg: No edema.     Left lower  leg: No edema.     Comments: Full ROM of bilat shoulders. There is a mild + hawkins, mild + external rotation pain. Strength is intact. There is mild tenderness posterior shoulder. Otherwise shoulder exam stable.   Neurological:     Mental Status: She is alert and oriented to person, place, and time.  Psychiatric:        Behavior: Behavior normal.     Assessment/Plan: 1. Malignant neoplasm of right breast in female, estrogen receptor positive, unspecified site of breast ([place]) Follows with survival clinic. Dx 2012.  2. Fasting hyperglycemia Stable on recent bloodwork.  3. Arthritis Right shoulder only joint bothering her now; she plans to follow with ortho.  4. Skin cancer, basal cell Follows with dermatology on regular basis.   5. Osteopenia, unspecified location Exercise, ca/d. Plan to recheck in 5 years time.  6. Gastroesophageal reflux disease, esophagitis presence not specified Omeprazole prn.   7. OSA (obstructive sleep apnea) In process of further evaluation. I feel this is important for overall health as well as intermittent palpitations at bedtime.   8. Essential hypertension Stable; continue current medications. - Comprehensive metabolic panel; Future - Comprehensive metabolic panel  9. Varicose veins of bilateral lower extremities with other complications Following with vein clinic.   10. Prolapsed internal hemorrhoids, grade 3 max Recent banding; follows with Dr. Carlean Purl.   11. Rosacea Follows with derm; stable.   12. Lymphocytopenia - CBC with Differential/Platelet; Future - CBC with Differential/Platelet  13. Acute pain of right shoulder Will call to schedule with ortho. I do not suspect any large injury or surgical need at this point.  Return in about 6 months (around 01/12/2019) for physical exam.  Micheline Rough, MD

## 2018-07-12 ENCOUNTER — Encounter: Payer: Self-pay | Admitting: Family Medicine

## 2018-07-12 ENCOUNTER — Ambulatory Visit: Payer: Medicare Other | Admitting: Family Medicine

## 2018-07-12 ENCOUNTER — Other Ambulatory Visit: Payer: Self-pay

## 2018-07-12 VITALS — BP 130/62 | HR 70 | Temp 98.5°F | Ht <= 58 in | Wt 116.0 lb

## 2018-07-12 DIAGNOSIS — R7301 Impaired fasting glucose: Secondary | ICD-10-CM

## 2018-07-12 DIAGNOSIS — I10 Essential (primary) hypertension: Secondary | ICD-10-CM

## 2018-07-12 DIAGNOSIS — L719 Rosacea, unspecified: Secondary | ICD-10-CM

## 2018-07-12 DIAGNOSIS — K642 Third degree hemorrhoids: Secondary | ICD-10-CM

## 2018-07-12 DIAGNOSIS — C4491 Basal cell carcinoma of skin, unspecified: Secondary | ICD-10-CM

## 2018-07-12 DIAGNOSIS — M25511 Pain in right shoulder: Secondary | ICD-10-CM

## 2018-07-12 DIAGNOSIS — C50911 Malignant neoplasm of unspecified site of right female breast: Secondary | ICD-10-CM

## 2018-07-12 DIAGNOSIS — M1990 Unspecified osteoarthritis, unspecified site: Secondary | ICD-10-CM

## 2018-07-12 DIAGNOSIS — Z170 Estrogen receptor positive status [ER+]: Secondary | ICD-10-CM

## 2018-07-12 DIAGNOSIS — D72810 Lymphocytopenia: Secondary | ICD-10-CM

## 2018-07-12 DIAGNOSIS — G4733 Obstructive sleep apnea (adult) (pediatric): Secondary | ICD-10-CM

## 2018-07-12 DIAGNOSIS — I83893 Varicose veins of bilateral lower extremities with other complications: Secondary | ICD-10-CM

## 2018-07-12 DIAGNOSIS — K219 Gastro-esophageal reflux disease without esophagitis: Secondary | ICD-10-CM

## 2018-07-12 DIAGNOSIS — M8580 Other specified disorders of bone density and structure, unspecified site: Secondary | ICD-10-CM

## 2018-07-12 LAB — COMPREHENSIVE METABOLIC PANEL
ALT: 42 U/L — ABNORMAL HIGH (ref 0–35)
AST: 26 U/L (ref 0–37)
Albumin: 4.7 g/dL (ref 3.5–5.2)
Alkaline Phosphatase: 40 U/L (ref 39–117)
BUN: 19 mg/dL (ref 6–23)
CO2: 28 mEq/L (ref 19–32)
Calcium: 10.2 mg/dL (ref 8.4–10.5)
Chloride: 102 mEq/L (ref 96–112)
Creatinine, Ser: 0.64 mg/dL (ref 0.40–1.20)
GFR: 90.57 mL/min (ref >60.00)
Glucose, Bld: 93 mg/dL (ref 70–99)
Potassium: 4.9 mEq/L (ref 3.5–5.1)
Sodium: 139 mEq/L (ref 135–145)
Total Bilirubin: 0.7 mg/dL (ref 0.2–1.2)
Total Protein: 7.2 g/dL (ref 6.0–8.3)

## 2018-07-12 LAB — CBC WITH DIFFERENTIAL/PLATELET
BASOS PCT: 1.4 % (ref 0.0–3.0)
Basophils Absolute: 0.1 10*3/uL (ref 0.0–0.1)
EOS PCT: 2.4 % (ref 0.0–5.0)
Eosinophils Absolute: 0.1 10*3/uL (ref 0.0–0.7)
HEMATOCRIT: 39 % (ref 36.0–46.0)
HEMOGLOBIN: 13.1 g/dL (ref 12.0–15.0)
LYMPHS PCT: 39.7 % (ref 12.0–46.0)
Lymphs Abs: 1.8 10*3/uL (ref 0.7–4.0)
MCHC: 33.5 g/dL (ref 30.0–36.0)
MCV: 91.5 fl (ref 78.0–100.0)
Monocytes Absolute: 0.4 10*3/uL (ref 0.1–1.0)
Monocytes Relative: 9.6 % (ref 3.0–12.0)
Neutro Abs: 2.2 10*3/uL (ref 1.4–7.7)
Neutrophils Relative %: 46.9 % (ref 43.0–77.0)
Platelets: 233 10*3/uL (ref 150.0–400.0)
RBC: 4.26 Mil/uL (ref 3.87–5.11)
RDW: 14.1 % (ref 11.5–15.5)
WBC: 4.6 10*3/uL (ref 4.0–10.5)

## 2018-08-14 ENCOUNTER — Telehealth: Payer: Self-pay | Admitting: Adult Health

## 2018-08-14 NOTE — Telephone Encounter (Signed)
Per 4/8 schedule message changed 4/30 to LTS visit and moved to September. Spoke with patient.

## 2018-08-31 ENCOUNTER — Encounter: Payer: Medicare Other | Admitting: Adult Health

## 2018-09-11 ENCOUNTER — Other Ambulatory Visit: Payer: Self-pay

## 2018-09-11 ENCOUNTER — Ambulatory Visit (INDEPENDENT_AMBULATORY_CARE_PROVIDER_SITE_OTHER): Payer: Medicare Other | Admitting: Family Medicine

## 2018-09-11 ENCOUNTER — Telehealth: Payer: Self-pay | Admitting: *Deleted

## 2018-09-11 DIAGNOSIS — M419 Scoliosis, unspecified: Secondary | ICD-10-CM

## 2018-09-11 NOTE — Telephone Encounter (Signed)
I called the pt and informed her of the message below.  She is aware the letter was left at the front desk for pickup and was given the number to all Mychart at [phone].

## 2018-09-11 NOTE — Progress Notes (Signed)
Virtual Visit via Video Note  I connected with Courtney [name]  on 09/11/18 at 10:00 AM EDT by a video enabled telemedicine application and verified that I am speaking with the correct person using two identifiers.  Location patient: home Location provider:work or home office Persons participating in the virtual visit: patient, provider  I discussed the limitations of evaluation and management by telemedicine and the availability of in person appointments. The patient expressed understanding and agreed to proceed.   Courtney [name] DOB: May 20, 1945 Encounter date: 09/11/2018  This is a 73 y.o. female who presents with Chief Complaint  Patient presents with  . Back Pain    History of present illness: Goes to neuromusc massage therapist for scoliosis and she would like to continue this. Her massage therapist needed letter of medical necessity. Courtney [name] has been seeing her for 20 years, usually goes monthly. Sometimes will go more if having discomfort.   Her last visit for massage was right before distancing started with COVID.   Does feel that these visits help her out quite a lot. Has pain during massage, but with the muscle release she does better overall.     No Known Allergies No outpatient medications have been marked as taking for the 09/11/18 encounter (Office Visit) with Caren Macadam, MD.   Current Facility-Administered Medications for the 09/11/18 encounter (Office Visit) with Caren Macadam, MD  Medication  . albuterol (PROVENTIL) (2.5 MG/3ML) 0.083% nebulizer solution 2.5 mg    Review of Systems  Constitutional: Negative for chills, fatigue and fever.  Respiratory: Negative for cough, chest tightness, shortness of breath and wheezing.   Cardiovascular: Negative for chest pain, palpitations and leg swelling.  Musculoskeletal: Positive for back pain.    Objective:  There were no vitals taken for this visit.      BP Readings from Last 3 Encounters:  07/12/18  130/62  06/12/18 (!) 110/58  03/13/18 130/68   Wt Readings from Last 3 Encounters:  07/12/18 116 lb (52.6 kg)  06/12/18 116 lb (52.6 kg)  03/13/18 119 lb (54 kg)    EXAM:  GENERAL: alert, oriented, appears well and in no acute distress  HEENT: atraumatic, conjunctiva clear, no obvious abnormalities on inspection of external nose and ears  NECK: normal movements of the head and neck  LUNGS: on inspection no signs of respiratory distress, breathing rate appears normal, no obvious gross SOB, gasping or wheezing  CV: no obvious cyanosis  MS: moves all visible extremities without noticeable abnormality  PSYCH/NEURO: pleasant and cooperative, no obvious depression or anxiety, speech and thought processing grossly intact   Assessment/Plan  1. Scoliosis, unspecified scoliosis type, unspecified spinal region Letter written for medical necessity for her to go massage.  She has been getting benefit from this procedure for over 20 years with the same provider.  It is helpful to keep her from needing medications to help manage pain secondary to scoliosis.  Scoliosis is documented on previous back imaging historically.  I discussed the assessment and treatment plan with the patient. The patient was provided an opportunity to ask questions and all were answered. The patient agreed with the plan and demonstrated an understanding of the instructions.   The patient was advised to call back or seek an in-person evaluation if the symptoms worsen or if the condition fails to improve as anticipated.  I provided 12 minutes of non-face-to-face time during this encounter.   Micheline Rough, MD

## 2018-09-11 NOTE — Telephone Encounter (Signed)
-----   Message from Caren Macadam, MD sent at 09/11/2018 10:17 AM EDT ----- Unfortunately we cannot email the letter. I have printed this for her; she can pick this up at her convenience OR if you can help her get back in with mychart she can print from home (ok to call her about letter now, but she wants to put off the mychart help until after 1 since she is homeschooling granddaughter now)

## 2019-01-15 ENCOUNTER — Telehealth: Payer: Self-pay | Admitting: Adult Health

## 2019-01-15 NOTE — Telephone Encounter (Signed)
Called patient regarding pre-screening questions for 09/15 appointments. °

## 2019-01-16 ENCOUNTER — Inpatient Hospital Stay: Payer: Medicare Other | Attending: Adult Health | Admitting: Adult Health

## 2019-01-16 ENCOUNTER — Encounter: Payer: Self-pay | Admitting: Adult Health

## 2019-01-16 ENCOUNTER — Other Ambulatory Visit: Payer: Self-pay

## 2019-01-16 VITALS — BP 121/51 | HR 68 | Temp 98.2°F | Resp 18 | Ht <= 58 in | Wt 118.9 lb

## 2019-01-16 DIAGNOSIS — C50912 Malignant neoplasm of unspecified site of left female breast: Secondary | ICD-10-CM

## 2019-01-16 DIAGNOSIS — Z170 Estrogen receptor positive status [ER+]: Secondary | ICD-10-CM

## 2019-01-16 DIAGNOSIS — Z853 Personal history of malignant neoplasm of breast: Secondary | ICD-10-CM | POA: Insufficient documentation

## 2019-01-16 DIAGNOSIS — C4491 Basal cell carcinoma of skin, unspecified: Secondary | ICD-10-CM | POA: Diagnosis not present

## 2019-01-16 DIAGNOSIS — M8580 Other specified disorders of bone density and structure, unspecified site: Secondary | ICD-10-CM | POA: Insufficient documentation

## 2019-01-16 NOTE — Progress Notes (Signed)
CLINIC:  Survivorship   REASON FOR VISIT:  Routine follow-up for history of breast cancer.   BRIEF ONCOLOGIC HISTORY:  (1) status post bilateral mastectomieswith bilateral sentinel lymph node sampling 01/13/2011, showing (a) on the left, 2 foci of invasive ductal carcinoma, the larger measuring 1.2 cm, grade 2, both tumors being strongly estrogen and progesterone receptor positive, with MIB-once of 13-14%, and HER-2/neu negative. Her (pT1c pN0 = stage IA) (b) on the right, apT1c pN0, stage IA invasive ductal carcinoma, grade 2, estrogen receptor 100% positive, progesterone receptor 9% positive, with an MIB-1 of 12% and no HER-2 amplification  (2) Oncotype DX on the largest tumor showed a score of 10, suggesting a risk of recurrence outside of the breast within the next 10 years would be 7% if the patient only systemic therapy was tamoxifen for 5 years. It also predicts no benefit from chemotherapy  (3) letrozolewas started October of 2012  (4) Osteopenia, with T score of -1.4 on bone density at [facility] 11/05/2013.   INTERVAL HISTORY:  Courtney [name] presents to the Survivorship Clinic today for routine follow-up for her history of breast cancer.  Overall, she reports feeling quite well.  She continues to see her PCP regularly, and is up to date with her cancer screenings.    She has no new issues today.  She wants to know why her friends who have undergone mastectomies are having follow up imaging, and she is not.  She is exercising regularly.    REVIEW OF SYSTEMS:  Review of Systems  Constitutional: Negative for appetite change, chills, fatigue, fever and unexpected weight change.  HENT:   Negative for hearing loss, lump/mass and trouble swallowing.   Eyes: Negative for eye problems and icterus.  Respiratory: Negative for chest tightness, cough and shortness of breath.   Cardiovascular: Negative for chest pain, leg swelling and palpitations.  Gastrointestinal:  Negative for abdominal distention, abdominal pain, constipation, diarrhea, nausea and vomiting.  Endocrine: Negative for hot flashes.  Musculoskeletal: Negative for arthralgias.  Skin: Negative for itching and rash.  Neurological: Negative for dizziness, extremity weakness, headaches and numbness.  Hematological: Negative for adenopathy. Does not bruise/bleed easily.  Psychiatric/Behavioral: Negative for depression. The patient is not nervous/anxious.    Breast: Denies any new nodularity, masses, tenderness, nipple changes, or nipple discharge.       PAST MEDICAL/SURGICAL HISTORY:  Past Medical History:  Diagnosis Date  . Breast cancer [facility]) July 2012   post double mastectomy; Dr Humphrey Rolls  . Diverticulosis   . Family history of breast cancer   . GERD (gastroesophageal reflux disease)    S/P dilation X 1  . Hemorrhoids   . Hypertension   . Night sweats   . OSA (obstructive sleep apnea) 08/03/2017   Very mild with AHI 7.2/hr.    . Prolapsed internal hemorrhoids, grade 3 06/27/2017   Grade 2 on anoscopy exam but grade 3 by history and seen a colonoscopy as well All 3 columns banded 06/27/2017  09/21/2017 banded RP and RA    . Reactive airway disease    Past Surgical History:  Procedure Laterality Date  . ABDOMINAL HYSTERECTOMY    . bilateral breast reconstruction  Jan 2013  . BREAST SURGERY  01/13/11   bilateral total mastectomy; Dr Dalbert Batman  . COLONOSCOPY  2014   Dr Olevia Perches; hyperplastic polyp  . ESOPHAGEAL DILATION  2014  . HEMORRHOID BANDING  2019   February and May  . NASAL SEPTUM SURGERY    . RECONSTRUCTION /  CORRECTION OF NIPPLE / Tresa Moore  June 2013  . THYROGLOSSAL DUCT CYST       ALLERGIES:  No Known Allergies   CURRENT MEDICATIONS:  Outpatient Encounter Medications as of 01/16/2019  Medication Sig  . albuterol (PROVENTIL) (2.5 MG/3ML) 0.083% nebulizer solution Take 3 mLs (2.5 mg total) by nebulization every 6 (six) hours as needed for wheezing or shortness of breath.  Marland Kitchen  amLODipine (NORVASC) 5 MG tablet TAKE 1 TABLET(5 MG) BY MOUTH DAILY  . aspirin 81 MG tablet Take 81 mg by mouth daily.    . budesonide-formoterol (SYMBICORT) 160-4.5 MCG/ACT inhaler INHALE 1 TO 2 PUFFS EVERY 12 HOURS AS NEEDED. GARGLE AND SPIT AFTER USE  . calcium citrate-vitamin D (CITRACAL+D) 315-200 MG-UNIT per tablet Take 1 tablet by mouth daily.  . Cholecalciferol (VITAMIN D3) 1000 UNITS CAPS Take by mouth daily.    Marland Kitchen co-enzyme Q-10 30 MG capsule Take 50 mg by mouth daily.   Marland Kitchen Fexofenadine HCl (ALLEGRA PO) Take by mouth daily.  Marland Kitchen FINACEA 15 % cream Apply 1 application topically 2 (two) times daily. For rosacea  . Glucosamine Sulfate 500 MG CAPS Take 3 capsules by mouth daily.  . hydrochlorothiazide (MICROZIDE) 12.5 MG capsule Take 1 capsule (12.5 mg total) by mouth daily.  Marland Kitchen losartan (COZAAR) 100 MG tablet Take 1 tablet (100 mg total) by mouth daily.  . Misc Natural Products (TART CHERRY ADVANCED PO) Take 2,400 mg by mouth daily.  . montelukast (SINGULAIR) 10 MG tablet Take 1 tablet (10 mg total) by mouth at bedtime.  . Multiple Vitamins-Minerals (MULTIVITAMIN WITH MINERALS) tablet Take 2 tablets by mouth daily.    . Multiple Vitamins-Minerals (PRESERVISION AREDS PO) Take by mouth daily.  . OMEGA 3 1000 MG CAPS Take 1,000 mg by mouth daily.    Marland Kitchen omeprazole (PRILOSEC) 20 MG capsule TAKE 1 CAPSULE BY MOUTH TWICE DAILY BEFORE A MEAL  . oxymetazoline (AFRIN) 0.05 % nasal spray Place 3 sprays into both nostrils at bedtime.  . psyllium (METAMUCIL) 58.6 % powder Take 1 packet by mouth daily.  Marland Kitchen tretinoin (RETIN-A) 0.025 % cream Apply topically at bedtime.  . [DISCONTINUED] loratadine (CLARITIN) 10 MG tablet Take 10 mg by mouth at bedtime.   Facility-Administered Encounter Medications as of 01/16/2019  Medication  . albuterol (PROVENTIL) (2.5 MG/3ML) 0.083% nebulizer solution 2.5 mg     ONCOLOGIC FAMILY HISTORY:  Family History  Problem Relation Age of Onset  . Breast cancer Mother 43  .  Colon cancer Mother 30  . Cervical cancer Mother 50       s/p hysterectomy  . Heart attack Father 10  . Diabetes Father        probable pre DM  . Skin cancer Father        maybe melanoma  . Cancer Sister 4       appendiceal  . Heart attack Maternal Grandfather 36  . Heart attack Paternal Uncle        X2 ; >50y  . Heart attack Maternal Uncle        d. 38  . Parkinson's disease Maternal Grandmother        d. 71  . Heart attack Paternal Grandfather   . Breast cancer Other        unspecified age; maternal great aunt (MGM's sister)  . Breast cancer Cousin        maternal 1st cousin, once-removed; unspecified age  . Heart attack Paternal Uncle        >50y  .  Stroke Neg Hx      SOCIAL HISTORY:  Social History   Socioeconomic History  . Marital status: Married    Spouse name: Not on file  . Number of children: Not on file  . Years of education: Not on file  . Highest education level: Not on file  Occupational History  . Not on file  Social Needs  . Financial resource strain: Not on file  . Food insecurity    Worry: Not on file    Inability: Not on file  . Transportation needs    Medical: Not on file    Non-medical: Not on file  Tobacco Use  . Smoking status: Former Smoker    Years: 5.00    Types: Cigarettes    Quit date: 05/03/1965    Years since quitting: 53.7  . Smokeless tobacco: Never Used  . Tobacco comment: smoked [phone], less than 1 ppd  Substance and Sexual Activity  . Alcohol use: Yes    Alcohol/week: 14.0 standard drinks    Types: 14 Glasses of wine per week    Comment: 2-3 /day  . Drug use: No  . Sexual activity: Yes  Lifestyle  . Physical activity    Days per week: Not on file    Minutes per session: Not on file  . Stress: Not on file  Relationships  . Social Herbalist on phone: Not on file    Gets together: Not on file    Attends religious service: Not on file    Active member of club or organization: Not on file    Attends  meetings of clubs or organizations: Not on file    Relationship status: Not on file  . Intimate partner violence    Fear of current or ex partner: Not on file    Emotionally abused: Not on file    Physically abused: Not on file    Forced sexual activity: Not on file  Other Topics Concern  . Not on file  Social History Narrative   Married   2-3 glasses wine/day   remote small vol smmoker      PHYSICAL EXAMINATION:  Vital Signs: Vitals:   01/16/19 1514  BP: (!) 121/51  Pulse: 68  Resp: 18  Temp: 98.2 F (36.8 C)  SpO2: 100%   Filed Weights   01/16/19 1514  Weight: 118 lb 14.4 oz (53.9 kg)   General: Well-nourished, well-appearing female in no acute distress.  Unaccompanied today.   HEENT: Head is normocephalic.  Pupils equal and reactive to light. Conjunctivae clear without exudate.  Sclerae anicteric. Oral mucosa is pink, moist.  Oropharynx is pink without lesions or erythema.  Lymph: No cervical, supraclavicular, or infraclavicular lymphadenopathy noted on palpation.  Cardiovascular: Regular rate and rhythm.Marland Kitchen Respiratory: Clear to auscultation bilaterally. Chest expansion symmetric; breathing non-labored.  Breast Exam:  -s/p bilateral mastectomies with implant placement, no nodules or masses noted bilaterally -Axilla: No axillary adenopathy bilaterally.  GI: Abdomen soft and round; non-tender, non-distended. Bowel sounds normoactive. No hepatosplenomegaly.   GU: Deferred.  Neuro: No focal deficits. Steady gait.  Psych: Mood and affect normal and appropriate for situation.  MSK: No focal spinal tenderness to palpation, full range of motion in bilateral upper extremities Extremities: No edema. Skin: Warm and dry.  LABORATORY DATA:  None for this visit   DIAGNOSTIC IMAGING:  Most recent mammogram:  N/a s/p bilateral mastectomies    ASSESSMENT AND PLAN:  Ms.. [name] is a pleasant 73 y.o.  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  She presents to the Survivorship Clinic for surveillance and routine follow-up.   1. History of breast cancer:  Courtney [name] is currently clinically and radiographically without evidence of disease or recurrence of breast cancer. We reviewed the NCCN guidelines which do not recommend routine imaging after bilateral mastectomies.  I suggested she talk to Dr. Harlow Mares, her plastic surgeon who placed her implants about whether or not an MRI is indicated based on the type of implant she has.  She will do this.  We will see her back in a year for LTS follow up.  I encouraged her to call me with any questions or concerns before her next visit at the cancer center, and I would be happy to see her sooner, if needed.    2. Bone health:  Given Courtney [name]'s age, history of breast cancer, and her previous anti-estrogen therapy with Letrozole, she is at risk for bone demineralization. She underwent bone density in 2019 that showed osteopenia showing a T score of -1.1 in the left femoral neck. She was given education on specific food and activities to promote bone health.  3. Cancer screening:  Due to Courtney [name]'s history and her age, she should receive screening for skin cancers, colon cancer, and gynecologic cancers. She was encouraged to follow-up with her PCP for appropriate cancer screenings.   4. Health maintenance and wellness promotion: Courtney [name] was encouraged to consume 5-7 servings of fruits and vegetables per day. She was also encouraged to engage in moderate to vigorous exercise for 30 minutes per day most days of the week. She was instructed to limit her alcohol consumption and continue to abstain from tobacco use.     Dispo:  -Return to cancer center in one year for LTS follow up   A total of (20) minutes of face-to-face time was spent with this patient with greater than 50% of  that time in counseling and care-coordination.   Courtney Phlegm, [name] [phone]   Note: PRIMARY CARE PROVIDER Caren Macadam, [place] [phone]

## 2019-01-17 ENCOUNTER — Telehealth: Payer: Self-pay | Admitting: Adult Health

## 2019-01-17 NOTE — Telephone Encounter (Signed)
I talk with patient regarding schedule  

## 2019-01-28 NOTE — Progress Notes (Signed)
Courtney [name] DOB: [birth year] Encounter date: 01/29/2019  This is a 73 y.o. female who presents for complete physical   History of present illness/Additional concerns: Last visit was 09/2018; for med necessity letter for massage to help with scoliosis. Prior to that was seen 07/2018.   HTN: norvasc 5mg  and losartan 100 and hctz 12.5. Not usually checking at home. Has been well controlled with medication.    Varicose veins: completed laser surgery: postponed since not wanting to do over summer. Has been for laser on both legs and injections on one leg. Goes back in a week for second leg.   OSA: follow up repeat sleep study? Sleeping well with oral appliance. Feels well rested in morning.   GERD: usually well controlled. Worse with fatty intake. Some spices bother her. Usually takes the omeprazole at night.   Osteopenia: Does exercise on regular basis; yoga, water aerobics. Wants to restart walking now that it is light.   Basal cell skin cancer/rosacea: follows with dermatology: Follows with Dr. 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 Follows with survivor nurse once yearly.   Arthritis:besides right shoulder there are no significant irritated joints.   Mild intermittent asthma: uses symbicort once daily as preventative. Taking singulair as well. Still has a lot of drainage in morning. Better once she is up and moving around.   Does go to "natural alternatives" for meds that she takes. (ie calcium, coq10, cherry root)  Last colonoscopy:04/2017; repeat in 5 years  UTD with immunizations except flu shot. Due for bloodwork.  A little more fatigued in afternoon. Feels a little more fatigued with exercise. Just like endurance isn't as good. No chest pain/pressure.   Past Medical History:  Diagnosis Date  . Breast cancer [facility]) July 2012   post double mastectomy; Dr Humphrey Rolls  . Diverticulosis   . Family history of breast cancer   . GERD (gastroesophageal reflux disease)     S/P dilation X 1  . Hemorrhoids   . Hypertension   . Night sweats   . OSA (obstructive sleep apnea) 08/03/2017   Very mild with AHI 7.2/hr.    . Prolapsed internal hemorrhoids, grade 3 06/27/2017   Grade 2 on anoscopy exam but grade 3 by history and seen a colonoscopy as well All 3 columns banded 06/27/2017  09/21/2017 banded RP and RA    . Reactive airway disease    Past Surgical History:  Procedure Laterality Date  . ABDOMINAL HYSTERECTOMY    . bilateral breast reconstruction  Jan 2013  . BREAST SURGERY  01/13/11   bilateral total mastectomy; Dr Dalbert Batman  . COLONOSCOPY  2014   Dr Olevia Perches; hyperplastic polyp  . ESOPHAGEAL DILATION  2014  . HEMORRHOID BANDING  2019   February and May  . NASAL SEPTUM SURGERY    . RECONSTRUCTION / CORRECTION OF NIPPLE / Tresa Moore  June 2013  . THYROGLOSSAL DUCT CYST     No Known Allergies Current Meds  Medication Sig  . albuterol (PROVENTIL) (2.5 MG/3ML) 0.083% nebulizer solution Take 3 mLs (2.5 mg total) by nebulization every 6 (six) hours as needed for wheezing or shortness of breath.  Marland Kitchen amLODipine (NORVASC) 5 MG tablet TAKE 1 TABLET(5 MG) BY MOUTH DAILY  . aspirin 81 MG tablet Take 81 mg by mouth daily.    . budesonide-formoterol (SYMBICORT) 160-4.5 MCG/ACT inhaler INHALE 1 TO 2 PUFFS EVERY 12 HOURS AS NEEDED. GARGLE AND SPIT AFTER USE  . calcium citrate-vitamin D (CITRACAL+D) 315-200 MG-UNIT per tablet Take  1 tablet by mouth daily.  . Cholecalciferol (VITAMIN D3) 1000 UNITS CAPS Take by mouth daily.    Marland Kitchen co-enzyme Q-10 30 MG capsule Take 50 mg by mouth daily.   Marland Kitchen Fexofenadine HCl (ALLEGRA PO) Take by mouth daily.  Marland Kitchen FINACEA 15 % cream Apply 1 application topically 2 (two) times daily. For rosacea  . Glucosamine Sulfate 500 MG CAPS Take 3 capsules by mouth daily.  . hydrochlorothiazide (MICROZIDE) 12.5 MG capsule Take 1 capsule (12.5 mg total) by mouth daily.  Marland Kitchen losartan (COZAAR) 100 MG tablet Take 1 tablet (100 mg total) by mouth daily.  . Misc  Natural Products (TART CHERRY ADVANCED PO) Take 2,400 mg by mouth daily.  . montelukast (SINGULAIR) 10 MG tablet Take 1 tablet (10 mg total) by mouth at bedtime.  . Multiple Vitamins-Minerals (MULTIVITAMIN WITH MINERALS) tablet Take 2 tablets by mouth daily.    . Multiple Vitamins-Minerals (PRESERVISION AREDS PO) Take by mouth daily.  . OMEGA 3 1000 MG CAPS Take 1,000 mg by mouth daily.    Marland Kitchen omeprazole (PRILOSEC) 20 MG capsule TAKE 1 CAPSULE BY MOUTH TWICE DAILY BEFORE A MEAL  . oxymetazoline (AFRIN) 0.05 % nasal spray Place 3 sprays into both nostrils at bedtime.  . psyllium (METAMUCIL) 58.6 % powder Take 1 packet by mouth daily.  Marland Kitchen tretinoin (RETIN-A) 0.025 % cream Apply topically at bedtime.   Current Facility-Administered Medications for the 01/29/19 encounter (Office Visit) with Caren Macadam, MD  Medication  . albuterol (PROVENTIL) (2.5 MG/3ML) 0.083% nebulizer solution 2.5 mg   Social History   Tobacco Use  . Smoking status: Former Smoker    Years: 5.00    Types: Cigarettes    Quit date: 05/03/1965    Years since quitting: 53.7  . Smokeless tobacco: Never Used  . Tobacco comment: smoked 1962-1967, less than 1 ppd  Substance Use Topics  . Alcohol use: Yes    Alcohol/week: 14.0 standard drinks    Types: 14 Glasses of wine per week    Comment: 2-3 /day   Family History  Problem Relation Age of Onset  . Breast cancer Mother 61  . Colon cancer Mother 69  . Cervical cancer Mother 41       s/p hysterectomy  . Heart attack Father 69  . Diabetes Father        probable pre DM  . Skin cancer Father        maybe melanoma  . Cancer Sister 52       appendiceal  . Heart attack Maternal Grandfather 25  . Heart attack Paternal Uncle        X2 ; >50y  . Heart attack Maternal Uncle        d. 66  . Parkinson's disease Maternal Grandmother        d. 34  . Heart attack Paternal Grandfather   . Breast cancer Other        unspecified age; maternal great aunt (MGM's sister)  .  Breast cancer Cousin        maternal 1st cousin, once-removed; unspecified age  . Heart attack Paternal Uncle        >50y  . Stroke Neg Hx      Review of Systems  Constitutional: Negative for activity change, appetite change, chills, fatigue, fever and unexpected weight change.  HENT: Negative for congestion, ear pain, hearing loss, sinus pressure, sinus pain, sore throat and trouble swallowing.   Eyes: Negative for pain and visual disturbance.  Respiratory: Negative for cough, chest tightness, shortness of breath and wheezing.   Cardiovascular: Negative for chest pain, palpitations and leg swelling.  Gastrointestinal: Negative for abdominal pain, blood in stool, constipation, diarrhea, nausea and vomiting.  Genitourinary: Negative for difficulty urinating and menstrual problem.  Musculoskeletal: Negative for arthralgias and back pain.  Skin: Negative for rash.  Neurological: Negative for dizziness, weakness, numbness and headaches.  Hematological: Negative for adenopathy. Does not bruise/bleed easily.  Psychiatric/Behavioral: Negative for sleep disturbance and suicidal ideas. The patient is not nervous/anxious.     CBC:  Lab Results  Component Value Date   WBC 4.6 07/12/2018   HGB 13.1 07/12/2018   HGB 13.1 12/09/2015   HCT 39.0 07/12/2018   HCT 39.9 12/09/2015   MCH 29.8 12/09/2015   MCH 31.5 01/11/2011   MCHC 33.5 07/12/2018   RDW 14.1 07/12/2018   RDW 13.3 12/09/2015   PLT 233.0 07/12/2018   PLT 217 12/09/2015   CMP: Lab Results  Component Value Date   NA 139 07/12/2018   NA 143 12/09/2015   K 4.9 07/12/2018   K 4.0 12/09/2015   CL 102 07/12/2018   CL 104 06/08/2012   CO2 28 07/12/2018   CO2 29 12/09/2015   ANIONGAP 10 12/09/2015   GLUCOSE 93 07/12/2018   GLUCOSE 103 12/09/2015   GLUCOSE 105 (H) 06/08/2012   BUN 19 07/12/2018   BUN 17.3 12/09/2015   CREATININE 0.64 07/12/2018   CREATININE 0.8 12/09/2015   GFRAA >60 01/11/2011   CALCIUM 10.2 07/12/2018    CALCIUM 10.3 12/09/2015   PROT 7.2 07/12/2018   PROT 7.4 12/09/2015   BILITOT 0.7 07/12/2018   BILITOT 0.52 12/09/2015   ALKPHOS 40 07/12/2018   ALKPHOS 46 12/09/2015   ALT 42 (H) 07/12/2018   ALT 47 12/09/2015   AST 26 07/12/2018   AST 30 12/09/2015   LIPID: Lab Results  Component Value Date   CHOL 214 (H) 01/17/2018   TRIG 79.0 01/17/2018   HDL 90.80 01/17/2018   LDLCALC 107 (H) 01/17/2018    Objective:  BP 122/70 (BP Location: Left Arm, Patient Position: Sitting, Cuff Size: Normal)   Pulse 67   Temp (!) 97.3 F (36.3 C) (Temporal)   Ht 4\' 9"  (1.448 m)   Wt 117 lb 1.6 oz (53.1 kg)   SpO2 97%   BMI 25.34 kg/m   Weight: 117 lb 1.6 oz (53.1 kg)   BP Readings from Last 3 Encounters:  01/29/19 122/70  01/16/19 (!) 121/51  07/12/18 130/62   Wt Readings from Last 3 Encounters:  01/29/19 117 lb 1.6 oz (53.1 kg)  01/16/19 118 lb 14.4 oz (53.9 kg)  07/12/18 116 lb (52.6 kg)    Physical Exam Constitutional:      General: She is not in acute distress.    Appearance: She is well-developed.  HENT:     Head: Normocephalic and atraumatic.     Right Ear: External ear normal.     Left Ear: External ear normal.     Mouth/Throat:     Pharynx: No oropharyngeal exudate.  Eyes:     Conjunctiva/sclera: Conjunctivae normal.     Pupils: Pupils are equal, round, and reactive to light.  Neck:     Musculoskeletal: Normal range of motion and neck supple.     Thyroid: No thyromegaly.  Cardiovascular:     Rate and Rhythm: Normal rate and regular rhythm.     Heart sounds: Normal heart sounds. No murmur. No friction rub. No  gallop.   Pulmonary:     Effort: Pulmonary effort is normal.     Breath sounds: Normal breath sounds.  Abdominal:     General: Bowel sounds are normal. There is no distension.     Palpations: Abdomen is soft. There is no mass.     Tenderness: There is no abdominal tenderness. There is no guarding.     Hernia: No hernia is present.  Musculoskeletal: Normal  range of motion.        General: No tenderness or deformity.  Lymphadenopathy:     Cervical: No cervical adenopathy.  Skin:    General: Skin is warm and dry.     Findings: No rash.  Neurological:     Mental Status: She is alert and oriented to person, place, and time.     Deep Tendon Reflexes: Reflexes normal.     Reflex Scores:      Tricep reflexes are 2+ on the right side and 2+ on the left side.      Bicep reflexes are 2+ on the right side and 2+ on the left side.      Brachioradialis reflexes are 2+ on the right side and 2+ on the left side.      Patellar reflexes are 2+ on the right side and 2+ on the left side. Psychiatric:        Speech: Speech normal.        Behavior: Behavior normal.        Thought Content: Thought content normal.     Assessment/Plan: Health Maintenance Due  Topic Date Due  . INFLUENZA VACCINE  12/02/2018   Health Maintenance reviewed.  1. Preventative health care Flu shot today. She continues with exercise, healthy eating.  2. Mild intermittent asthma, unspecified whether complicated Stable. Continue singulair, symbicort.  3. Essential hypertension We discussed trying 3 in 1 combination pill for bp; but since she would have to change from losartan to valsartan, she elected to keep same for now. This is option if desired in future.  - CBC with Differential/Platelet; Future - Comprehensive metabolic panel; Future  4. Fasting hyperglycemia - Hemoglobin A1c; Future  5. Gastroesophageal reflux disease, esophagitis presence not specified On omeprazole, stable.  6. Skin cancer, basal cell Follows with Dr. Ubaldo Glassing  7. Lipid screening - Lipid panel; Future  8. OSA (obstructive sleep apnea) Wearing oral appliance. Sleeping well.  9. Varicose veins of bilateral lower extremities with other complications Following with vein clinic.   10. Personal history of breast cancer Following with cancer survival clinic.  11. Other fatigue Will add on  some vitamin levels. She is doing less exercise than previous (not doing yoga, water aerobics); just feels endurance is decreased.   12. Vitamin D deficiency - VITAMIN D 25 Hydroxy (Vit-D Deficiency, Fractures); Future  13. Vitamin B12 deficiency - Vitamin B12; Future  Return in about 6 months (around 07/29/2019) for Chronic condition visit.  Micheline Rough, MD

## 2019-01-29 ENCOUNTER — Ambulatory Visit (INDEPENDENT_AMBULATORY_CARE_PROVIDER_SITE_OTHER): Payer: Medicare Other | Admitting: Family Medicine

## 2019-01-29 ENCOUNTER — Encounter: Payer: Self-pay | Admitting: Family Medicine

## 2019-01-29 ENCOUNTER — Other Ambulatory Visit: Payer: Self-pay

## 2019-01-29 VITALS — BP 122/70 | HR 67 | Temp 97.3°F | Ht <= 58 in | Wt 117.1 lb

## 2019-01-29 DIAGNOSIS — G4733 Obstructive sleep apnea (adult) (pediatric): Secondary | ICD-10-CM

## 2019-01-29 DIAGNOSIS — E538 Deficiency of other specified B group vitamins: Secondary | ICD-10-CM | POA: Diagnosis not present

## 2019-01-29 DIAGNOSIS — R7301 Impaired fasting glucose: Secondary | ICD-10-CM | POA: Diagnosis not present

## 2019-01-29 DIAGNOSIS — Z853 Personal history of malignant neoplasm of breast: Secondary | ICD-10-CM

## 2019-01-29 DIAGNOSIS — E559 Vitamin D deficiency, unspecified: Secondary | ICD-10-CM | POA: Diagnosis not present

## 2019-01-29 DIAGNOSIS — J4520 Mild intermittent asthma, uncomplicated: Secondary | ICD-10-CM | POA: Diagnosis not present

## 2019-01-29 DIAGNOSIS — Z13220 Encounter for screening for lipoid disorders: Secondary | ICD-10-CM

## 2019-01-29 DIAGNOSIS — C4491 Basal cell carcinoma of skin, unspecified: Secondary | ICD-10-CM

## 2019-01-29 DIAGNOSIS — R5383 Other fatigue: Secondary | ICD-10-CM

## 2019-01-29 DIAGNOSIS — K219 Gastro-esophageal reflux disease without esophagitis: Secondary | ICD-10-CM

## 2019-01-29 DIAGNOSIS — I10 Essential (primary) hypertension: Secondary | ICD-10-CM | POA: Diagnosis not present

## 2019-01-29 DIAGNOSIS — I83893 Varicose veins of bilateral lower extremities with other complications: Secondary | ICD-10-CM

## 2019-01-29 DIAGNOSIS — Z0000 Encounter for general adult medical examination without abnormal findings: Secondary | ICD-10-CM | POA: Diagnosis not present

## 2019-01-29 DIAGNOSIS — Z23 Encounter for immunization: Secondary | ICD-10-CM | POA: Diagnosis not present

## 2019-01-29 LAB — LIPID PANEL
Cholesterol: 223 mg/dL — ABNORMAL HIGH (ref 0–200)
HDL: 66.5 mg/dL (ref >39.00)
LDL Cholesterol: 141 mg/dL — ABNORMAL HIGH (ref 0–99)
NonHDL: 156.32
Total CHOL/HDL Ratio: 3
Triglycerides: 77 mg/dL (ref 0.0–149.0)
VLDL: 15.4 mg/dL (ref 0.0–40.0)

## 2019-01-29 LAB — CBC WITH DIFFERENTIAL/PLATELET
Basophils Absolute: 0.1 10*3/uL (ref 0.0–0.1)
Basophils Relative: 2 % (ref 0.0–3.0)
Eosinophils Absolute: 0.1 10*3/uL (ref 0.0–0.7)
Eosinophils Relative: 3.2 % (ref 0.0–5.0)
HCT: 39 % (ref 36.0–46.0)
Hemoglobin: 13.1 g/dL (ref 12.0–15.0)
Lymphocytes Relative: 31.3 % (ref 12.0–46.0)
Lymphs Abs: 1.3 10*3/uL (ref 0.7–4.0)
MCHC: 33.5 g/dL (ref 30.0–36.0)
MCV: 91.8 fl (ref 78.0–100.0)
Monocytes Absolute: 0.4 10*3/uL (ref 0.1–1.0)
Monocytes Relative: 8.2 % (ref 3.0–12.0)
Neutro Abs: 2.4 10*3/uL (ref 1.4–7.7)
Neutrophils Relative %: 55.3 % (ref 43.0–77.0)
Platelets: 249 10*3/uL (ref 150.0–400.0)
RBC: 4.25 Mil/uL (ref 3.87–5.11)
RDW: 13.3 % (ref 11.5–15.5)
WBC: 4.3 10*3/uL (ref 4.0–10.5)

## 2019-01-29 LAB — COMPREHENSIVE METABOLIC PANEL
ALT: 40 U/L — ABNORMAL HIGH (ref 0–35)
AST: 28 U/L (ref 0–37)
Albumin: 4.5 g/dL (ref 3.5–5.2)
Alkaline Phosphatase: 48 U/L (ref 39–117)
BUN: 15 mg/dL (ref 6–23)
CO2: 29 mEq/L (ref 19–32)
Calcium: 10.1 mg/dL (ref 8.4–10.5)
Chloride: 103 mEq/L (ref 96–112)
Creatinine, Ser: 0.73 mg/dL (ref 0.40–1.20)
GFR: 77.69 mL/min (ref >60.00)
Glucose, Bld: 103 mg/dL — ABNORMAL HIGH (ref 70–99)
Potassium: 4.2 mEq/L (ref 3.5–5.1)
Sodium: 143 mEq/L (ref 135–145)
Total Bilirubin: 0.6 mg/dL (ref 0.2–1.2)
Total Protein: 7.1 g/dL (ref 6.0–8.3)

## 2019-01-29 LAB — HEMOGLOBIN A1C: Hgb A1c MFr Bld: 6 % (ref 4.6–6.5)

## 2019-01-29 LAB — VITAMIN D 25 HYDROXY (VIT D DEFICIENCY, FRACTURES): VITD: 70.46 ng/mL (ref 30.00–100.00)

## 2019-01-29 LAB — VITAMIN B12: Vitamin B-12: 609 pg/mL (ref 211–911)

## 2019-01-29 MED ORDER — HYDROCHLOROTHIAZIDE 12.5 MG PO CAPS: 12.5000 mg | ORAL_CAPSULE | Freq: Every day | ORAL | 3 refills | Status: DC

## 2019-01-29 MED ORDER — LOSARTAN POTASSIUM 100 MG PO TABS: 100.0000 mg | ORAL_TABLET | Freq: Every day | ORAL | 3 refills | Status: DC

## 2019-01-29 MED ORDER — MONTELUKAST SODIUM 10 MG PO TABS: 10.0000 mg | ORAL_TABLET | Freq: Every day | ORAL | 3 refills | Status: DC

## 2019-01-29 MED ORDER — BUDESONIDE-FORMOTEROL FUMARATE 160-4.5 MCG/ACT IN AERO: INHALATION_SPRAY | RESPIRATORY_TRACT | 11 refills | Status: DC

## 2019-01-29 MED ORDER — OMEPRAZOLE 20 MG PO CPDR: DELAYED_RELEASE_CAPSULE | ORAL | 3 refills | Status: DC

## 2019-01-29 MED ORDER — AMLODIPINE BESYLATE 5 MG PO TABS: 5.0000 mg | ORAL_TABLET | Freq: Every day | ORAL | 3 refills | Status: DC

## 2019-01-29 NOTE — Addendum Note (Signed)
Addended by: Agnes Lawrence on: 01/29/2019 08:51 AM   Modules accepted: Orders

## 2019-01-29 NOTE — Addendum Note (Signed)
Addended by: Elmer Picker on: 01/29/2019 08:48 AM   Modules accepted: Orders

## 2019-02-03 ENCOUNTER — Other Ambulatory Visit: Payer: Self-pay | Admitting: Family Medicine

## 2019-02-03 DIAGNOSIS — I10 Essential (primary) hypertension: Secondary | ICD-10-CM

## 2019-02-03 DIAGNOSIS — R7301 Impaired fasting glucose: Secondary | ICD-10-CM

## 2019-02-03 DIAGNOSIS — E785 Hyperlipidemia, unspecified: Secondary | ICD-10-CM

## 2019-06-02 ENCOUNTER — Ambulatory Visit: Payer: Medicare Other

## 2019-06-30 ENCOUNTER — Other Ambulatory Visit: Payer: Self-pay | Admitting: Nurse Practitioner

## 2019-07-11 DIAGNOSIS — Z85828 Personal history of other malignant neoplasm of skin: Secondary | ICD-10-CM | POA: Diagnosis not present

## 2019-07-11 DIAGNOSIS — L245 Irritant contact dermatitis due to other chemical products: Secondary | ICD-10-CM | POA: Diagnosis not present

## 2019-07-11 DIAGNOSIS — D485 Neoplasm of uncertain behavior of skin: Secondary | ICD-10-CM | POA: Diagnosis not present

## 2019-07-11 DIAGNOSIS — D0461 Carcinoma in situ of skin of right upper limb, including shoulder: Secondary | ICD-10-CM | POA: Diagnosis not present

## 2019-07-11 DIAGNOSIS — L570 Actinic keratosis: Secondary | ICD-10-CM | POA: Diagnosis not present

## 2019-07-23 ENCOUNTER — Other Ambulatory Visit: Payer: Self-pay

## 2019-07-24 ENCOUNTER — Other Ambulatory Visit (INDEPENDENT_AMBULATORY_CARE_PROVIDER_SITE_OTHER): Payer: Medicare PPO

## 2019-07-24 ENCOUNTER — Other Ambulatory Visit: Payer: Self-pay

## 2019-07-24 DIAGNOSIS — I10 Essential (primary) hypertension: Secondary | ICD-10-CM

## 2019-07-24 DIAGNOSIS — R7301 Impaired fasting glucose: Secondary | ICD-10-CM

## 2019-07-24 DIAGNOSIS — E785 Hyperlipidemia, unspecified: Secondary | ICD-10-CM

## 2019-07-24 LAB — COMPREHENSIVE METABOLIC PANEL
ALT: 38 U/L — ABNORMAL HIGH (ref 0–35)
AST: 26 U/L (ref 0–37)
Albumin: 4.3 g/dL (ref 3.5–5.2)
Alkaline Phosphatase: 45 U/L (ref 39–117)
BUN: 14 mg/dL (ref 6–23)
CO2: 29 mEq/L (ref 19–32)
Calcium: 9.3 mg/dL (ref 8.4–10.5)
Chloride: 105 mEq/L (ref 96–112)
Creatinine, Ser: 0.68 mg/dL (ref 0.40–1.20)
GFR: 84.21 mL/min (ref >60.00)
Glucose, Bld: 104 mg/dL — ABNORMAL HIGH (ref 70–99)
Potassium: 3.9 mEq/L (ref 3.5–5.1)
Sodium: 142 mEq/L (ref 135–145)
Total Bilirubin: 0.6 mg/dL (ref 0.2–1.2)
Total Protein: 6.6 g/dL (ref 6.0–8.3)

## 2019-07-24 LAB — CBC WITH DIFFERENTIAL/PLATELET
Basophils Absolute: 0.1 10*3/uL (ref 0.0–0.1)
Basophils Relative: 1.2 % (ref 0.0–3.0)
Eosinophils Absolute: 0.1 10*3/uL (ref 0.0–0.7)
Eosinophils Relative: 3.4 % (ref 0.0–5.0)
HCT: 38.2 % (ref 36.0–46.0)
Hemoglobin: 12.7 g/dL (ref 12.0–15.0)
Lymphocytes Relative: 35.3 % (ref 12.0–46.0)
Lymphs Abs: 1.5 10*3/uL (ref 0.7–4.0)
MCHC: 33.2 g/dL (ref 30.0–36.0)
MCV: 93.6 fl (ref 78.0–100.0)
Monocytes Absolute: 0.4 10*3/uL (ref 0.1–1.0)
Monocytes Relative: 9 % (ref 3.0–12.0)
Neutro Abs: 2.1 10*3/uL (ref 1.4–7.7)
Neutrophils Relative %: 51.1 % (ref 43.0–77.0)
Platelets: 189 10*3/uL (ref 150.0–400.0)
RBC: 4.08 Mil/uL (ref 3.87–5.11)
RDW: 13.2 % (ref 11.5–15.5)
WBC: 4.2 10*3/uL (ref 4.0–10.5)

## 2019-07-24 LAB — LIPID PANEL
Cholesterol: 214 mg/dL — ABNORMAL HIGH (ref 0–200)
HDL: 61.8 mg/dL (ref >39.00)
LDL Cholesterol: 137 mg/dL — ABNORMAL HIGH (ref 0–99)
NonHDL: 151.8
Total CHOL/HDL Ratio: 3
Triglycerides: 73 mg/dL (ref 0.0–149.0)
VLDL: 14.6 mg/dL (ref 0.0–40.0)

## 2019-07-24 LAB — HEMOGLOBIN A1C: Hgb A1c MFr Bld: 5.8 % (ref 4.6–6.5)

## 2019-07-25 ENCOUNTER — Telehealth: Payer: Self-pay | Admitting: Family Medicine

## 2019-07-25 NOTE — Telephone Encounter (Signed)
Patient returned call to office and spoke with Denice Paradise, CMA. Results given.

## 2019-07-25 NOTE — Telephone Encounter (Signed)
Pt is returning today's call regarding lab results. Thanks

## 2019-07-30 ENCOUNTER — Ambulatory Visit: Payer: Medicare Other | Admitting: Family Medicine

## 2019-08-07 ENCOUNTER — Other Ambulatory Visit: Payer: Self-pay

## 2019-08-08 ENCOUNTER — Ambulatory Visit (INDEPENDENT_AMBULATORY_CARE_PROVIDER_SITE_OTHER): Payer: Medicare PPO | Admitting: Family Medicine

## 2019-08-08 ENCOUNTER — Encounter: Payer: Self-pay | Admitting: Family Medicine

## 2019-08-08 VITALS — BP 100/60 | HR 66 | Temp 98.0°F | Ht <= 58 in | Wt 116.2 lb

## 2019-08-08 DIAGNOSIS — M419 Scoliosis, unspecified: Secondary | ICD-10-CM | POA: Diagnosis not present

## 2019-08-08 DIAGNOSIS — G4733 Obstructive sleep apnea (adult) (pediatric): Secondary | ICD-10-CM

## 2019-08-08 DIAGNOSIS — I10 Essential (primary) hypertension: Secondary | ICD-10-CM | POA: Diagnosis not present

## 2019-08-08 DIAGNOSIS — E785 Hyperlipidemia, unspecified: Secondary | ICD-10-CM

## 2019-08-08 DIAGNOSIS — M8580 Other specified disorders of bone density and structure, unspecified site: Secondary | ICD-10-CM

## 2019-08-08 DIAGNOSIS — K219 Gastro-esophageal reflux disease without esophagitis: Secondary | ICD-10-CM

## 2019-08-08 DIAGNOSIS — L719 Rosacea, unspecified: Secondary | ICD-10-CM | POA: Diagnosis not present

## 2019-08-08 DIAGNOSIS — J4520 Mild intermittent asthma, uncomplicated: Secondary | ICD-10-CM

## 2019-08-08 DIAGNOSIS — R7301 Impaired fasting glucose: Secondary | ICD-10-CM

## 2019-08-08 NOTE — Patient Instructions (Addendum)
Check blood pressures at home and let me know if regularly [id number] systolic or lower in which case you can hold the hydrochlorothiazide and monitor for a week.   Ok to hold the symbicort and monitor breathing.   Ok to hold the singulair (montelukast) for 2 months and if doing ok with allergies/breathing then you can try to hold the allegra as well.

## 2019-08-08 NOTE — Progress Notes (Signed)
Courtney [name] DOB: Jul 30, 1945 Encounter date: 08/08/2019  This is a 74 y.o. female who presents with Chief Complaint  Patient presents with  . Follow-up    History of present illness: Interested in cutting back on medication. Used symbicort more regularly for last year, but would like to cut back. Doesn't have resp problems and better now that everyone is masked. Has a nebulizer but hasn't used this in over a couple of years and only used when sick in past.   HTN: norvasc 5mg  and losartan 100and hctz 12.5. Not usually checking at home. Has been well controlled with medication.   Varicose veins: completed laser surgery: postponed since not wanting to do over summer. Has been for laser on both legs and injections on one leg. Goes back in a week for second leg.   OSA:  Sleeping well with oral appliance. Feels well rested in morning.   GERD: usually well controlled. Worse with fatty intake. Some spices bother her. Usually takes the omeprazole at night.does not sx with certain foods.   Osteopenia:Does exercise on regular basis; yoga, water aerobics.   Basal cell skin cancer/rosacea: follows with dermatology:Follows with Dr. 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 Follows with survivor nurse once yearly.  Arthritis:besides right shoulder there are no significant irritated joints.  Mild intermittent asthma:uses symbicort once daily as preventative. Taking singulair as well.   Last colonoscopy:04/2017; repeat in 5 years  UTD with immunizations  No Known Allergies Current Meds  Medication Sig  . albuterol (PROVENTIL) (2.5 MG/3ML) 0.083% nebulizer solution Take 3 mLs (2.5 mg total) by nebulization every 6 (six) hours as needed for wheezing or shortness of breath.  Marland Kitchen amLODipine (NORVASC) 5 MG tablet Take 1 tablet (5 mg total) by mouth daily.  Marland Kitchen aspirin 81 MG tablet Take 81 mg by mouth daily.    . budesonide-formoterol (SYMBICORT) 160-4.5 MCG/ACT  inhaler INHALE 1 TO 2 PUFFS EVERY 12 HOURS AS NEEDED. GARGLE AND SPIT AFTER USE  . calcium citrate-vitamin D (CITRACAL+D) 315-200 MG-UNIT per tablet Take 1 tablet by mouth daily.  . Cholecalciferol (VITAMIN D3) 1000 UNITS CAPS Take by mouth daily.    Marland Kitchen co-enzyme Q-10 30 MG capsule Take 50 mg by mouth daily.   Marland Kitchen Fexofenadine HCl (ALLEGRA PO) Take by mouth daily.  Marland Kitchen FINACEA 15 % cream Apply 1 application topically 2 (two) times daily. For rosacea  . Glucosamine Sulfate 500 MG CAPS Take 3 capsules by mouth daily.  . hydrochlorothiazide (MICROZIDE) 12.5 MG capsule Take 1 capsule (12.5 mg total) by mouth daily.  Marland Kitchen losartan (COZAAR) 100 MG tablet Take 1 tablet (100 mg total) by mouth daily.  . Misc Natural Products (TART CHERRY ADVANCED PO) Take 2,400 mg by mouth daily.  . Multiple Vitamins-Minerals (MULTIVITAMIN WITH MINERALS) tablet Take 2 tablets by mouth daily.    . Multiple Vitamins-Minerals (PRESERVISION AREDS PO) Take by mouth daily.  . OMEGA 3 1000 MG CAPS Take 1,000 mg by mouth daily.    Marland Kitchen omeprazole (PRILOSEC) 20 MG capsule TAKE 1 CAPSULE BY MOUTH TWICE DAILY BEFORE A MEAL  . oxymetazoline (AFRIN) 0.05 % nasal spray Place 3 sprays into both nostrils at bedtime.  . psyllium (METAMUCIL) 58.6 % powder Take 1 packet by mouth daily.  . [DISCONTINUED] montelukast (SINGULAIR) 10 MG tablet Take 1 tablet (10 mg total) by mouth at bedtime.  . [DISCONTINUED] tretinoin (RETIN-A) 0.025 % cream Apply topically at bedtime.   Current Facility-Administered Medications for the 08/08/19 encounter (Office Visit) with  Caren Macadam, MD  Medication  . albuterol (PROVENTIL) (2.5 MG/3ML) 0.083% nebulizer solution 2.5 mg    Review of Systems  Constitutional: Negative for chills, fatigue and fever.  Respiratory: Negative for cough, chest tightness, shortness of breath and wheezing.   Cardiovascular: Negative for chest pain, palpitations and leg swelling.    Objective:  BP 100/60 (BP Location: Left Arm,  Patient Position: Sitting, Cuff Size: Normal)   Pulse 66   Temp 98 F (36.7 C) (Temporal)   Ht 4\' 9"  (1.448 m)   Wt 116 lb 3.2 oz (52.7 kg)   BMI 25.15 kg/m   Weight: 116 lb 3.2 oz (52.7 kg)   BP Readings from Last 3 Encounters:  08/08/19 100/60  01/29/19 122/70  01/16/19 (!) 121/51   Wt Readings from Last 3 Encounters:  08/08/19 116 lb 3.2 oz (52.7 kg)  01/29/19 117 lb 1.6 oz (53.1 kg)  01/16/19 118 lb 14.4 oz (53.9 kg)    Physical Exam Constitutional:      General: She is not in acute distress.    Appearance: She is well-developed. She is not diaphoretic.  HENT:     Head: Normocephalic and atraumatic.     Right Ear: External ear normal.     Left Ear: External ear normal.  Eyes:     Conjunctiva/sclera: Conjunctivae normal.     Pupils: Pupils are equal, round, and reactive to light.  Neck:     Thyroid: No thyromegaly.  Cardiovascular:     Rate and Rhythm: Normal rate and regular rhythm.     Heart sounds: Normal heart sounds. No murmur. No friction rub. No gallop.   Pulmonary:     Effort: Pulmonary effort is normal. No respiratory distress.     Breath sounds: Normal breath sounds. No wheezing or rales.  Musculoskeletal:     Cervical back: Neck supple.     Right lower leg: No edema.     Left lower leg: No edema.  Lymphadenopathy:     Cervical: No cervical adenopathy.  Skin:    General: Skin is warm and dry.  Neurological:     Mental Status: She is alert and oriented to person, place, and time.     Cranial Nerves: No cranial nerve deficit.     Motor: No abnormal muscle tone.     Deep Tendon Reflexes: Reflexes normal.     Reflex Scores:      Tricep reflexes are 1+ on the right side and 1+ on the left side.      Bicep reflexes are 1+ on the right side and 1+ on the left side.      Patellar reflexes are 1+ on the right side and 1+ on the left side.      Achilles reflexes are 1+ on the right side and 1+ on the left side. Psychiatric:        Behavior: Behavior  normal.     Assessment/Plan  1. Essential hypertension Will monitor at home; consider decreasing hctz if pressures lower at home.   2. Mild intermittent asthma, unspecified whether complicated Has been well controlled.   3. OSA (obstructive sleep apnea) Doing well w oral device; has had sleep eval in last year.  4. Gastroesophageal reflux disease, unspecified whether esophagitis present Controlled with omeprazole. Would like to continue this medication.   5. Osteopenia, unspecified location Continues with weight bearing exercise.  6. Rosacea Follows with dermatology.   7. Scoliosis, unspecified scoliosis type, unspecified spinal region Has done well  with this.   8. Fasting hyperglycemia She is eating less carbs; improvement in A1C.     Return in about 6 months (around 02/07/2020) for physical exam with bloodwork before.    Micheline Rough, MD

## 2019-08-15 DIAGNOSIS — D225 Melanocytic nevi of trunk: Secondary | ICD-10-CM | POA: Diagnosis not present

## 2019-08-15 DIAGNOSIS — L821 Other seborrheic keratosis: Secondary | ICD-10-CM | POA: Diagnosis not present

## 2019-08-15 DIAGNOSIS — Z85828 Personal history of other malignant neoplasm of skin: Secondary | ICD-10-CM | POA: Diagnosis not present

## 2019-08-15 DIAGNOSIS — D1801 Hemangioma of skin and subcutaneous tissue: Secondary | ICD-10-CM | POA: Diagnosis not present

## 2019-08-15 DIAGNOSIS — C44622 Squamous cell carcinoma of skin of right upper limb, including shoulder: Secondary | ICD-10-CM | POA: Diagnosis not present

## 2019-08-15 DIAGNOSIS — D485 Neoplasm of uncertain behavior of skin: Secondary | ICD-10-CM | POA: Diagnosis not present

## 2019-08-15 DIAGNOSIS — L570 Actinic keratosis: Secondary | ICD-10-CM | POA: Diagnosis not present

## 2019-09-03 DIAGNOSIS — Z85828 Personal history of other malignant neoplasm of skin: Secondary | ICD-10-CM | POA: Diagnosis not present

## 2019-09-03 DIAGNOSIS — C44622 Squamous cell carcinoma of skin of right upper limb, including shoulder: Secondary | ICD-10-CM | POA: Diagnosis not present

## 2019-11-20 DIAGNOSIS — H02834 Dermatochalasis of left upper eyelid: Secondary | ICD-10-CM | POA: Diagnosis not present

## 2019-11-20 DIAGNOSIS — H2513 Age-related nuclear cataract, bilateral: Secondary | ICD-10-CM | POA: Diagnosis not present

## 2019-11-20 DIAGNOSIS — H353131 Nonexudative age-related macular degeneration, bilateral, early dry stage: Secondary | ICD-10-CM | POA: Diagnosis not present

## 2019-11-20 DIAGNOSIS — H40053 Ocular hypertension, bilateral: Secondary | ICD-10-CM | POA: Diagnosis not present

## 2019-11-20 DIAGNOSIS — H02831 Dermatochalasis of right upper eyelid: Secondary | ICD-10-CM | POA: Diagnosis not present

## 2019-12-31 ENCOUNTER — Telehealth: Payer: Self-pay | Admitting: Internal Medicine

## 2019-12-31 ENCOUNTER — Telehealth: Payer: Self-pay | Admitting: Family Medicine

## 2019-12-31 NOTE — Telephone Encounter (Signed)
Spoke with the pt and advised her Dr Ethlyn Gallery is out of the office and in this case she should go to the emergency room immediately and she declined and stated "she does not want to do such a germy place".

## 2019-12-31 NOTE — Telephone Encounter (Signed)
Patient called to talk to Dr. Ethlyn Gallery and needs her to call her back as soon as possible.  She aspirated a calcium tablet.  Please advise

## 2019-12-31 NOTE — Telephone Encounter (Signed)
Patient reports coughing up pill and is now breathing normal. Several pieces of the pill were coughed up over two hours. Patient is not having any respiratory symptoms and feels that nothing further is needed.

## 2019-12-31 NOTE — Telephone Encounter (Signed)
No longer needed

## 2020-01-01 DIAGNOSIS — L820 Inflamed seborrheic keratosis: Secondary | ICD-10-CM | POA: Diagnosis not present

## 2020-01-01 DIAGNOSIS — Z85828 Personal history of other malignant neoplasm of skin: Secondary | ICD-10-CM | POA: Diagnosis not present

## 2020-01-01 DIAGNOSIS — D692 Other nonthrombocytopenic purpura: Secondary | ICD-10-CM | POA: Diagnosis not present

## 2020-01-01 DIAGNOSIS — D1801 Hemangioma of skin and subcutaneous tissue: Secondary | ICD-10-CM | POA: Diagnosis not present

## 2020-01-01 DIAGNOSIS — L603 Nail dystrophy: Secondary | ICD-10-CM | POA: Diagnosis not present

## 2020-01-01 DIAGNOSIS — L821 Other seborrheic keratosis: Secondary | ICD-10-CM | POA: Diagnosis not present

## 2020-01-01 DIAGNOSIS — D225 Melanocytic nevi of trunk: Secondary | ICD-10-CM | POA: Diagnosis not present

## 2020-01-01 DIAGNOSIS — L814 Other melanin hyperpigmentation: Secondary | ICD-10-CM | POA: Diagnosis not present

## 2020-01-01 DIAGNOSIS — L570 Actinic keratosis: Secondary | ICD-10-CM | POA: Diagnosis not present

## 2020-01-02 ENCOUNTER — Encounter: Payer: Self-pay | Admitting: Family Medicine

## 2020-01-02 NOTE — Telephone Encounter (Signed)
Sent mychart message

## 2020-01-09 ENCOUNTER — Telehealth: Payer: Self-pay | Admitting: Adult Health

## 2020-01-09 ENCOUNTER — Telehealth: Payer: Self-pay | Admitting: Hematology and Oncology

## 2020-01-09 NOTE — Telephone Encounter (Signed)
Rescheduled appts per LC template change. Pt confirmed cancellation and new appt date and time.  

## 2020-01-17 ENCOUNTER — Encounter: Payer: Medicare Other | Admitting: Adult Health

## 2020-01-29 ENCOUNTER — Other Ambulatory Visit: Payer: Self-pay | Admitting: Family Medicine

## 2020-02-01 ENCOUNTER — Inpatient Hospital Stay: Payer: Medicare PPO | Attending: Adult Health | Admitting: Adult Health

## 2020-02-01 ENCOUNTER — Other Ambulatory Visit: Payer: Self-pay

## 2020-02-01 ENCOUNTER — Encounter: Payer: Self-pay | Admitting: Adult Health

## 2020-02-01 VITALS — BP 137/76 | HR 74 | Temp 97.3°F | Resp 18 | Ht <= 58 in | Wt 114.7 lb

## 2020-02-01 DIAGNOSIS — Z7951 Long term (current) use of inhaled steroids: Secondary | ICD-10-CM | POA: Diagnosis not present

## 2020-02-01 DIAGNOSIS — M8580 Other specified disorders of bone density and structure, unspecified site: Secondary | ICD-10-CM | POA: Insufficient documentation

## 2020-02-01 DIAGNOSIS — Z853 Personal history of malignant neoplasm of breast: Secondary | ICD-10-CM | POA: Diagnosis not present

## 2020-02-01 DIAGNOSIS — Z800 Family history of malignant neoplasm of digestive organs: Secondary | ICD-10-CM | POA: Insufficient documentation

## 2020-02-01 DIAGNOSIS — C50912 Malignant neoplasm of unspecified site of left female breast: Secondary | ICD-10-CM

## 2020-02-01 DIAGNOSIS — Z833 Family history of diabetes mellitus: Secondary | ICD-10-CM | POA: Insufficient documentation

## 2020-02-01 DIAGNOSIS — Z87891 Personal history of nicotine dependence: Secondary | ICD-10-CM | POA: Insufficient documentation

## 2020-02-01 DIAGNOSIS — Z9013 Acquired absence of bilateral breasts and nipples: Secondary | ICD-10-CM | POA: Diagnosis not present

## 2020-02-01 DIAGNOSIS — Z803 Family history of malignant neoplasm of breast: Secondary | ICD-10-CM | POA: Diagnosis not present

## 2020-02-01 DIAGNOSIS — Z79899 Other long term (current) drug therapy: Secondary | ICD-10-CM | POA: Insufficient documentation

## 2020-02-01 DIAGNOSIS — Z7982 Long term (current) use of aspirin: Secondary | ICD-10-CM | POA: Diagnosis not present

## 2020-02-01 DIAGNOSIS — Z8249 Family history of ischemic heart disease and other diseases of the circulatory system: Secondary | ICD-10-CM | POA: Insufficient documentation

## 2020-02-01 DIAGNOSIS — Z170 Estrogen receptor positive status [ER+]: Secondary | ICD-10-CM | POA: Diagnosis not present

## 2020-02-01 NOTE — Progress Notes (Signed)
CLINIC:  Survivorship   REASON FOR VISIT:  Routine follow-up for history of breast cancer.   BRIEF ONCOLOGIC HISTORY:  (1) status post bilateral mastectomieswith bilateral sentinel lymph node sampling 01/13/2011, showing (a) on the left, 2 foci of invasive ductal carcinoma, the larger measuring 1.2 cm, grade 2, both tumors being strongly estrogen and progesterone receptor positive, with MIB-once of 13-14%, and HER-2/neu negative. Her (pT1c pN0 = stage IA) (b) on the right, apT1c pN0, stage IA invasive ductal carcinoma, grade 2, estrogen receptor 100% positive, progesterone receptor 9% positive, with an MIB-1 of 12% and no HER-2 amplification  (2) Oncotype DX on the largest tumor showed a score of 10, suggesting a risk of recurrence outside of the breast within the next 10 years would be 7% if the patient only systemic therapy was tamoxifen for 5 years. It also predicts no benefit from chemotherapy  (3) letrozolefrom October of 2012 through 02/2016  (4) Osteopenia, with T score of -1.4 on bone density at [facility] 11/05/2013.   INTERVAL HISTORY:  Courtney [name] presents to the [facility]orship [facility] today for routine follow-up for her history of breast cancer.  Overall, she reports feeling quite well.  She has no new health concerns today.  She continues to see her PCP regularly.  She is up to date with her screenings. She is exercising regularly and has no new health concerns.    REVIEW OF SYSTEMS:  Review of Systems  Constitutional: Negative for appetite change, chills, fatigue, fever and unexpected weight change.  HENT:   Negative for hearing loss, lump/mass and trouble swallowing.   Eyes: Negative for eye problems and icterus.  Respiratory: Negative for chest tightness, cough and shortness of breath.   Cardiovascular: Negative for chest pain, leg swelling and palpitations.  Gastrointestinal: Negative for abdominal distention, abdominal pain, constipation, diarrhea,  nausea and vomiting.  Endocrine: Negative for hot flashes.  Musculoskeletal: Negative for arthralgias.  Skin: Negative for itching and rash.  Neurological: Negative for dizziness, extremity weakness, headaches and numbness.  Hematological: Negative for adenopathy. Does not bruise/bleed easily.  Psychiatric/Behavioral: Negative for depression. The patient is not nervous/anxious.    Breast: Denies any new nodularity, masses, tenderness, nipple changes, or nipple discharge.       PAST MEDICAL/SURGICAL HISTORY:  Past Medical History:  Diagnosis Date  . Breast cancer [facility]) July 2012   post double mastectomy; Dr Humphrey Rolls  . Diverticulosis   . Family history of breast cancer   . GERD (gastroesophageal reflux disease)    S/P dilation X 1  . Hemorrhoids   . Hypertension   . Night sweats   . OSA (obstructive sleep apnea) 08/03/2017   Very mild with AHI 7.2/hr.    . Prolapsed internal hemorrhoids, grade 3 06/27/2017   Grade 2 on anoscopy exam but grade 3 by history and seen a colonoscopy as well All 3 columns banded 06/27/2017  09/21/2017 banded RP and RA    . Reactive airway disease    Past Surgical History:  Procedure Laterality Date  . ABDOMINAL HYSTERECTOMY    . bilateral breast reconstruction  Jan 2013  . BREAST SURGERY  01/13/11   bilateral total mastectomy; Dr Dalbert Batman  . COLONOSCOPY  2014   Dr Olevia Perches; hyperplastic polyp  . ESOPHAGEAL DILATION  2014  . HEMORRHOID BANDING  2019   February and May  . NASAL SEPTUM SURGERY    . RECONSTRUCTION / CORRECTION OF NIPPLE / Tresa Moore  June 2013  . THYROGLOSSAL DUCT CYST  ALLERGIES:  No Known Allergies   CURRENT MEDICATIONS:  Outpatient Encounter Medications as of 02/01/2020  Medication Sig  . albuterol (PROVENTIL) (2.5 MG/3ML) 0.083% nebulizer solution Take 3 mLs (2.5 mg total) by nebulization every 6 (six) hours as needed for wheezing or shortness of breath.  Marland Kitchen amLODipine (NORVASC) 5 MG tablet Take 1 tablet (5 mg total) by mouth  daily.  Marland Kitchen aspirin 81 MG tablet Take 81 mg by mouth daily.    . budesonide-formoterol (SYMBICORT) 160-4.5 MCG/ACT inhaler INHALE 1 TO 2 PUFFS EVERY 12 HOURS AS NEEDED. GARGLE AND SPIT AFTER USE  . calcium citrate-vitamin D (CITRACAL+D) 315-200 MG-UNIT per tablet Take 1 tablet by mouth daily.  . Cholecalciferol (VITAMIN D3) 1000 UNITS CAPS Take by mouth daily.    Marland Kitchen co-enzyme Q-10 30 MG capsule Take 50 mg by mouth daily.   Marland Kitchen FINACEA 15 % cream Apply 1 application topically 2 (two) times daily. For rosacea  . Glucosamine Sulfate 500 MG CAPS Take 3 capsules by mouth daily.  . hydrochlorothiazide (MICROZIDE) 12.5 MG capsule Take 1 capsule (12.5 mg total) by mouth daily.  Marland Kitchen losartan (COZAAR) 100 MG tablet Take 1 tablet (100 mg total) by mouth daily.  . Misc Natural Products (TART CHERRY ADVANCED PO) Take 2,400 mg by mouth daily.  . Multiple Vitamins-Minerals (MULTIVITAMIN WITH MINERALS) tablet Take 2 tablets by mouth daily.    . Multiple Vitamins-Minerals (PRESERVISION AREDS PO) Take by mouth daily.  . OMEGA 3 1000 MG CAPS Take 1,000 mg by mouth daily.    Marland Kitchen omeprazole (PRILOSEC) 20 MG capsule TAKE 1 CAPSULE BY MOUTH TWICE DAILY BEFORE A MEAL  . psyllium (METAMUCIL) 58.6 % powder Take 1 packet by mouth daily.  . montelukast (SINGULAIR) 10 MG tablet TAKE 1 TABLET(10 MG) BY MOUTH AT BEDTIME (Patient not taking: Reported on 02/01/2020)  . oxymetazoline (AFRIN) 0.05 % nasal spray Place 3 sprays into both nostrils at bedtime. (Patient not taking: Reported on 02/01/2020)  . [DISCONTINUED] Fexofenadine HCl (ALLEGRA PO) Take by mouth daily.   Facility-Administered Encounter Medications as of 02/01/2020  Medication  . albuterol (PROVENTIL) (2.5 MG/3ML) 0.083% nebulizer solution 2.5 mg     ONCOLOGIC FAMILY HISTORY:  Family History  Problem Relation Age of Onset  . Breast cancer Mother 24  . Colon cancer Mother 51  . Cervical cancer Mother 53       s/p hysterectomy  . Heart attack Father 77  . Diabetes  Father        probable pre DM  . Skin cancer Father        maybe melanoma  . Cancer Sister 74       appendiceal  . Heart attack Maternal Grandfather 65  . Heart attack Paternal Uncle        X2 ; >50y  . Heart attack Maternal Uncle        d. 57  . Parkinson's disease Maternal Grandmother        d. 28  . Heart attack Paternal Grandfather   . Breast cancer Other        unspecified age; maternal great aunt (MGM's sister)  . Breast cancer Cousin        maternal 1st cousin, once-removed; unspecified age  . Heart attack Paternal Uncle        >50y  . Stroke Neg Hx      SOCIAL HISTORY:  Social History   Socioeconomic History  . Marital status: Married    Spouse name: Not on file  .  Number of children: Not on file  . Years of education: Not on file  . Highest education level: Not on file  Occupational History  . Not on file  Tobacco Use  . Smoking status: Former Smoker    Years: 5.00    Types: Cigarettes    Quit date: 05/03/1965    Years since quitting: 54.7  . Smokeless tobacco: Never Used  . Tobacco comment: smoked 1962-1967, less than 1 ppd  Vaping Use  . Vaping Use: Never used  Substance and Sexual Activity  . Alcohol use: Yes    Alcohol/week: 14.0 standard drinks    Types: 14 Glasses of wine per week    Comment: 2-3 /day  . Drug use: No  . Sexual activity: Yes  Other Topics Concern  . Not on file  Social History Narrative   Married   2-3 glasses wine/day   remote small vol smmoker   Social Determinants of Health   Financial Resource Strain:   . Difficulty of Paying Living Expenses: Not on file  Food Insecurity:   . Worried About Programme researcher, broadcasting/film/video in the Last Year: Not on file  . Ran Out of Food in the Last Year: Not on file  Transportation Needs:   . Lack of Transportation (Medical): Not on file  . Lack of Transportation (Non-Medical): Not on file  Physical Activity:   . Days of Exercise per Week: Not on file  . Minutes of Exercise per Session: Not on  file  Stress:   . Feeling of Stress : Not on file  Social Connections:   . Frequency of Communication with Friends and Family: Not on file  . Frequency of Social Gatherings with Friends and Family: Not on file  . Attends Religious Services: Not on file  . Active Member of Clubs or Organizations: Not on file  . Attends Banker Meetings: Not on file  . Marital Status: Not on file  Intimate Partner Violence:   . Fear of Current or Ex-Partner: Not on file  . Emotionally Abused: Not on file  . Physically Abused: Not on file  . Sexually Abused: Not on file      PHYSICAL EXAMINATION:  Vital Signs: Vitals:   02/01/20 1355  BP: 137/76  Pulse: 74  Resp: 18  Temp: (!) 97.3 F (36.3 C)  SpO2: 99%   Filed Weights   02/01/20 1355  Weight: 114 lb 11.2 oz (52 kg)   General: Well-nourished, well-appearing female in no acute distress.  Unaccompanied today.   HEENT: Head is normocephalic.  Pupils equal and reactive to light. Conjunctivae clear without exudate.  Sclerae anicteric. Oral mucosa is pink, moist.  Oropharynx is pink without lesions or erythema.  Lymph: No cervical, supraclavicular, or infraclavicular lymphadenopathy noted on palpation.  Cardiovascular: Regular rate and rhythm.Marland Kitchen Respiratory: Clear to auscultation bilaterally. Chest expansion symmetric; breathing non-labored.  Breast Exam:  -s/p bilateral mastectomies with implant placement, no nodules or masses noted bilaterally -Axilla: No axillary adenopathy bilaterally.  GI: Abdomen soft and round; non-tender, non-distended. Bowel sounds normoactive. No hepatosplenomegaly.   GU: Deferred.  Neuro: No focal deficits. Steady gait.  Psych: Mood and affect normal and appropriate for situation.  MSK: No focal spinal tenderness to palpation, full range of motion in bilateral upper extremities Extremities: No edema. Skin: Warm and dry.  LABORATORY DATA:  None for this visit   DIAGNOSTIC IMAGING:  Most recent  mammogram:  N/a s/p bilateral mastectomies    ASSESSMENT AND PLAN:  Courtney [name] is a pleasant 74 y.o. 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  She presents to the Survivorship [facility] for surveillance and routine follow-up.   1. History of breast cancer:  Courtney [name] is currently clinically and radiographically without evidence of disease or recurrence of breast cancer. She and I again discussed imaging with her implants.  I will refer her to Dr. Iran Planas for consultation and evaluation.  We will see her back in a year for LTS follow up.  I encouraged her to call me with any questions or concerns before her next visit at the cancer center, and I would be happy to see her sooner, if needed.    2. Bone health:  She was given education on specific food and activities to promote bone health.  3. Cancer screening:  Due to Courtney [name]'s history and her age, she should receive screening for skin cancers, colon cancer, and gynecologic cancers. She was encouraged to follow-up with her PCP for appropriate cancer screenings.   4. Health maintenance and wellness promotion: Courtney [name] was encouraged to consume 5-7 servings of fruits and vegetables per day. She was also encouraged to engage in moderate to vigorous exercise for 30 minutes per day most days of the week. She was instructed to limit her alcohol consumption and continue to abstain from tobacco use.     Dispo:  -Return to cancer center in one year for LTS follow up  Total encounter time: 20 minutes*  Wilber Bihari, NP 02/01/20 2:29 PM Medical Oncology and Hematology [facility] [place], [place] [address] Tel. [phone]    Fax. [phone]  *Total Encounter Time as defined by the Centers for Medicare and Medicaid Services includes, in addition to the  face-to-face time of a patient visit (documented in the note above) non-face-to-face time: obtaining and reviewing outside history, ordering and reviewing medications, tests or procedures, care coordination (communications with other health care professionals or caregivers) and documentation in the medical record.   Note: PRIMARY CARE PROVIDER Caren Macadam, [place] [phone]

## 2020-02-06 ENCOUNTER — Telehealth: Payer: Self-pay | Admitting: Adult Health

## 2020-02-06 NOTE — Telephone Encounter (Signed)
Scheduled per 10/1 los. Called and spoke with pt, confirmed 10/5 appt

## 2020-02-18 ENCOUNTER — Encounter: Payer: Self-pay | Admitting: Family Medicine

## 2020-02-18 ENCOUNTER — Ambulatory Visit (INDEPENDENT_AMBULATORY_CARE_PROVIDER_SITE_OTHER): Payer: Medicare PPO | Admitting: Family Medicine

## 2020-02-18 ENCOUNTER — Other Ambulatory Visit: Payer: Self-pay

## 2020-02-18 VITALS — BP 118/62 | Temp 98.3°F | Ht <= 58 in | Wt 114.8 lb

## 2020-02-18 DIAGNOSIS — M8580 Other specified disorders of bone density and structure, unspecified site: Secondary | ICD-10-CM

## 2020-02-18 DIAGNOSIS — G4733 Obstructive sleep apnea (adult) (pediatric): Secondary | ICD-10-CM | POA: Diagnosis not present

## 2020-02-18 DIAGNOSIS — I10 Essential (primary) hypertension: Secondary | ICD-10-CM

## 2020-02-18 DIAGNOSIS — Z0000 Encounter for general adult medical examination without abnormal findings: Secondary | ICD-10-CM | POA: Diagnosis not present

## 2020-02-18 DIAGNOSIS — R7301 Impaired fasting glucose: Secondary | ICD-10-CM

## 2020-02-18 DIAGNOSIS — Z853 Personal history of malignant neoplasm of breast: Secondary | ICD-10-CM

## 2020-02-18 DIAGNOSIS — E559 Vitamin D deficiency, unspecified: Secondary | ICD-10-CM | POA: Diagnosis not present

## 2020-02-18 DIAGNOSIS — J4520 Mild intermittent asthma, uncomplicated: Secondary | ICD-10-CM

## 2020-02-18 DIAGNOSIS — K219 Gastro-esophageal reflux disease without esophagitis: Secondary | ICD-10-CM | POA: Diagnosis not present

## 2020-02-18 DIAGNOSIS — Z13220 Encounter for screening for lipoid disorders: Secondary | ICD-10-CM

## 2020-02-18 DIAGNOSIS — R002 Palpitations: Secondary | ICD-10-CM | POA: Diagnosis not present

## 2020-02-18 NOTE — Progress Notes (Signed)
Courtney [name] DOB: 18-Jun-1945 Encounter date: 02/18/2020  This is a 74 y.o. female who presents for complete physical   History of present illness/Additional concerns:  HTN: norvasc 5mg  and losartan 100and hctz 12.5. hasn't been checking regularly at home. 107/73, 102/67 when she checked at home on occasion.   Varicose veins:completed laser surgery: had completed; has done well with this.   ERD:[id number] well with oral appliance. Feels well rested in morning.sometimes hard to fall asleep. Sometimes wakes in middle of night and hard to fall back asleep.   GERD: Usually takes the omeprazole at night. Went to reunion last week with kindergarden friends and pizza flared this. If she eats like she is supposed to then she does ok. Tomato base, fatty foods trigger it.   Osteopenia:Does exercise on regular basis; yoga, water aerobics. Last DEXA was 10/2017. Plan to repeat in 10/2020.  Basal cell skin cancer/rosacea: follows with dermatology:Follows with Dr. Ubaldo Glassing. Had to have mohs surgery on hand last spring.   Breast cancer right/left:bilat mastectomy Sept 2012. Follows with survivor nurse once yearly.last visit was 02/01/2020.  Mild intermittent asthma: symbicort, singulair - breathing has doing well. Has lose weight and walking more which she thinks helps.   Last colonoscopy:04/2017; repeat in 5 years  UTD with immunizations   Past Medical History:  Diagnosis Date  . Breast cancer [facility]) July 2012   post double mastectomy; Dr Humphrey Rolls  . Diverticulosis   . Family history of breast cancer   . GERD (gastroesophageal reflux disease)    S/P dilation X 1  . Hemorrhoids   . Hypertension   . Night sweats   . OSA (obstructive sleep apnea) 08/03/2017   Very mild with AHI 7.2/hr.    . Prolapsed internal hemorrhoids, grade 3 06/27/2017   Grade 2 on anoscopy exam but grade 3 by history and seen a colonoscopy as well All 3 columns banded 06/27/2017  09/21/2017 banded RP and RA    .  Reactive airway disease    Past Surgical History:  Procedure Laterality Date  . ABDOMINAL HYSTERECTOMY    . bilateral breast reconstruction  Jan 2013  . BREAST SURGERY  01/13/11   bilateral total mastectomy; Dr Dalbert Batman  . COLONOSCOPY  2014   Dr Olevia Perches; hyperplastic polyp  . ESOPHAGEAL DILATION  2014  . HEMORRHOID BANDING  2019   February and May  . NASAL SEPTUM SURGERY    . RECONSTRUCTION / CORRECTION OF NIPPLE / Tresa Moore  June 2013  . THYROGLOSSAL DUCT CYST     No Known Allergies Current Meds  Medication Sig  . albuterol (PROVENTIL) (2.5 MG/3ML) 0.083% nebulizer solution Take 3 mLs (2.5 mg total) by nebulization every 6 (six) hours as needed for wheezing or shortness of breath.  Marland Kitchen amLODipine (NORVASC) 5 MG tablet Take 1 tablet (5 mg total) by mouth daily.  Marland Kitchen aspirin 81 MG tablet Take 81 mg by mouth daily.    . budesonide-formoterol (SYMBICORT) 160-4.5 MCG/ACT inhaler INHALE 1 TO 2 PUFFS EVERY 12 HOURS AS NEEDED. GARGLE AND SPIT AFTER USE  . calcium citrate-vitamin D (CITRACAL+D) 315-200 MG-UNIT per tablet Take 1 tablet by mouth daily.  . Carboxymethylcellulose Sod PF (RETAINE CMC) 0.5 % SOLN Apply to eye.  . Cholecalciferol (VITAMIN D3) 1000 UNITS CAPS Take by mouth daily.    Marland Kitchen co-enzyme Q-10 30 MG capsule Take 50 mg by mouth daily.   Marland Kitchen FINACEA 15 % cream Apply 1 application topically 2 (two) times daily. For rosacea  . Glucosamine Sulfate  500 MG CAPS Take 3 capsules by mouth daily.  . hydrochlorothiazide (MICROZIDE) 12.5 MG capsule Take 1 capsule (12.5 mg total) by mouth daily.  Marland Kitchen losartan (COZAAR) 100 MG tablet Take 1 tablet (100 mg total) by mouth daily.  . Misc Natural Products (TART CHERRY ADVANCED PO) Take 2,400 mg by mouth daily.  . montelukast (SINGULAIR) 10 MG tablet TAKE 1 TABLET(10 MG) BY MOUTH AT BEDTIME  . Multiple Vitamins-Minerals (MULTIVITAMIN WITH MINERALS) tablet Take 2 tablets by mouth daily.    . Multiple Vitamins-Minerals (PRESERVISION AREDS 2 PO) Take by mouth.   . Multiple Vitamins-Minerals (PRESERVISION AREDS PO) Take by mouth daily.  . OMEGA 3 1000 MG CAPS Take 1,000 mg by mouth daily.    Marland Kitchen omeprazole (PRILOSEC) 20 MG capsule TAKE 1 CAPSULE BY MOUTH TWICE DAILY BEFORE A MEAL  . psyllium (METAMUCIL) 58.6 % powder Take 1 packet by mouth daily.  Marland Kitchen triamcinolone (NASACORT ALLERGY 24HR) 55 MCG/ACT AERO nasal inhaler Place 2 sprays into the nose daily.   Current Facility-Administered Medications for the 02/18/20 encounter (Office Visit) with Caren Macadam, MD  Medication  . albuterol (PROVENTIL) (2.5 MG/3ML) 0.083% nebulizer solution 2.5 mg   Social History   Tobacco Use  . Smoking status: Former Smoker    Years: 5.00    Types: Cigarettes    Quit date: 05/03/1965    Years since quitting: 54.8  . Smokeless tobacco: Never Used  . Tobacco comment: smoked 1962-1967, less than 1 ppd  Substance Use Topics  . Alcohol use: Yes    Alcohol/week: 14.0 standard drinks    Types: 14 Glasses of wine per week    Comment: 2-3 /day   Family History  Problem Relation Age of Onset  . Breast cancer Mother 49  . Colon cancer Mother 24  . Cervical cancer Mother 48       s/p hysterectomy  . Heart attack Father 67  . Diabetes Father        probable pre DM  . Skin cancer Father        maybe melanoma  . Cancer Sister 19       appendiceal  . Heart attack Maternal Grandfather 58  . Heart attack Paternal Uncle        X2 ; >50y  . Heart attack Maternal Uncle        d. 24  . Parkinson's disease Maternal Grandmother        d. 18  . Heart attack Paternal Grandfather   . Breast cancer Other        unspecified age; maternal great aunt (MGM's sister)  . Breast cancer Cousin        maternal 1st cousin, once-removed; unspecified age  . Heart attack Paternal Uncle        >50y  . Stroke Neg Hx      Review of Systems  Constitutional: Negative for activity change, appetite change, chills, fatigue, fever and unexpected weight change.  HENT: Negative for  congestion, ear pain, hearing loss, sinus pressure, sinus pain, sore throat and trouble swallowing.   Eyes: Negative for pain and visual disturbance.  Respiratory: Negative for cough, chest tightness, shortness of breath and wheezing.   Cardiovascular: Negative for chest pain, palpitations and leg swelling.  Gastrointestinal: Negative for abdominal pain, blood in stool, constipation, diarrhea, nausea and vomiting.  Genitourinary: Negative for difficulty urinating and menstrual problem.  Musculoskeletal: Negative for arthralgias and back pain.  Skin: Negative for rash.  Neurological: Negative for dizziness,  weakness, numbness and headaches.  Hematological: Negative for adenopathy. Does not bruise/bleed easily.  Psychiatric/Behavioral: Negative for sleep disturbance and suicidal ideas. The patient is not nervous/anxious.     CBC:  Lab Results  Component Value Date   WBC 4.2 07/24/2019   HGB 12.7 07/24/2019   HGB 13.1 12/09/2015   HCT 38.2 07/24/2019   HCT 39.9 12/09/2015   MCH 29.8 12/09/2015   MCH 31.5 01/11/2011   MCHC 33.2 07/24/2019   RDW 13.2 07/24/2019   RDW 13.3 12/09/2015   PLT 189.0 07/24/2019   PLT 217 12/09/2015   CMP: Lab Results  Component Value Date   NA 142 07/24/2019   NA 143 12/09/2015   K 3.9 07/24/2019   K 4.0 12/09/2015   CL 105 07/24/2019   CL 104 06/08/2012   CO2 29 07/24/2019   CO2 29 12/09/2015   ANIONGAP 10 12/09/2015   GLUCOSE 104 (H) 07/24/2019   GLUCOSE 103 12/09/2015   GLUCOSE 105 (H) 06/08/2012   BUN 14 07/24/2019   BUN 17.3 12/09/2015   CREATININE 0.68 07/24/2019   CREATININE 0.8 12/09/2015   GFRAA >60 01/11/2011   CALCIUM 9.3 07/24/2019   CALCIUM 10.3 12/09/2015   PROT 6.6 07/24/2019   PROT 7.4 12/09/2015   BILITOT 0.6 07/24/2019   BILITOT 0.52 12/09/2015   ALKPHOS 45 07/24/2019   ALKPHOS 46 12/09/2015   ALT 38 (H) 07/24/2019   ALT 47 12/09/2015   AST 26 07/24/2019   AST 30 12/09/2015   LIPID: Lab Results  Component Value  Date   CHOL 214 (H) 07/24/2019   TRIG 73.0 07/24/2019   HDL 61.80 07/24/2019   LDLCALC 137 (H) 07/24/2019    Objective:  BP 118/62 (BP Location: Left Arm, Patient Position: Sitting, Cuff Size: Normal)   Temp 98.3 F (36.8 C) (Oral)   Ht 4' 8.5" (1.435 m)   Wt 114 lb 12.8 oz (52.1 kg)   BMI 25.28 kg/m   Weight: 114 lb 12.8 oz (52.1 kg)   BP Readings from Last 3 Encounters:  02/18/20 118/62  02/01/20 137/76  08/08/19 100/60   Wt Readings from Last 3 Encounters:  02/18/20 114 lb 12.8 oz (52.1 kg)  02/01/20 114 lb 11.2 oz (52 kg)  08/08/19 116 lb 3.2 oz (52.7 kg)    Physical Exam Constitutional:      General: She is not in acute distress.    Appearance: She is well-developed.  HENT:     Head: Normocephalic and atraumatic.     Right Ear: External ear normal.     Left Ear: External ear normal.     Mouth/Throat:     Pharynx: No oropharyngeal exudate.  Eyes:     Conjunctiva/sclera: Conjunctivae normal.     Pupils: Pupils are equal, round, and reactive to light.  Neck:     Thyroid: No thyromegaly.  Cardiovascular:     Rate and Rhythm: Normal rate and regular rhythm.     Heart sounds: Murmur heard.  Systolic murmur is present with a grade of 1/6.  No friction rub. No gallop.   Pulmonary:     Effort: Pulmonary effort is normal.     Breath sounds: Normal breath sounds.  Abdominal:     General: Bowel sounds are normal. There is no distension.     Palpations: Abdomen is soft. There is no mass.     Tenderness: There is no abdominal tenderness. There is no guarding.     Hernia: No hernia is present.  Musculoskeletal:  General: No tenderness or deformity. Normal range of motion.     Cervical back: Normal range of motion and neck supple.  Lymphadenopathy:     Cervical: No cervical adenopathy.  Skin:    General: Skin is warm and dry.     Findings: No rash.  Neurological:     Mental Status: She is alert and oriented to person, place, and time.     Deep Tendon  Reflexes: Reflexes normal.     Reflex Scores:      Tricep reflexes are 2+ on the right side and 2+ on the left side.      Bicep reflexes are 2+ on the right side and 2+ on the left side.      Brachioradialis reflexes are 2+ on the right side and 2+ on the left side.      Patellar reflexes are 2+ on the right side and 2+ on the left side. Psychiatric:        Speech: Speech normal.        Behavior: Behavior normal.        Thought Content: Thought content normal.     Assessment/Plan: There are no preventive care reminders to display for this patient. Health Maintenance reviewed.  1. Preventative health care She is up-to-date with preventative health care.  Keep up with regular exercise and healthy eating.  2. Primary hypertension Well-controlled.  Continue current medications which include amlodipine 5 mg daily, losartan 100 mg daily, hydrochlorothiazide 12.5 mg daily. - CBC with Differential/Platelet; Future - Comprehensive metabolic panel; Future - Comprehensive metabolic panel - CBC with Differential/Platelet  3. Mild intermittent asthma, unspecified whether complicated Breathing has been well controlled.  Continue Singulair, Symbicort, albuterol as needed.  4. OSA (obstructive sleep apnea) Using oral appliance.  Well rested with this.  5. Gastroesophageal reflux disease, unspecified whether esophagitis present Flares with dietary triggers.  Continue omeprazole which does control symptoms.  6. Osteopenia, unspecified location Plan to recheck bone density in 10/2020.  Will check vitamin D levels as well.  Continue with weightbearing exercise and calcium dietary intake.  7. Personal history of breast cancer Follows with survivorship clinic.  Last visit was last month.  Has been stable.  Does have regular mammograms.  8. Intermittent palpitations These sound benign, never occurring with activity, only occurring at rest briefly at night.  We will check blood work.  EKG stable in  sinus bradycardia with no acute ST elevation or depression. - EKG 12-Lead - TSH; Future - TSH  9. Fasting hyperglycemia - Hemoglobin A1c; Future - Hemoglobin A1c  10. Vitamin D deficiency - VITAMIN D 25 Hydroxy (Vit-D Deficiency, Fractures); Future - VITAMIN D 25 Hydroxy (Vit-D Deficiency, Fractures)  11. Lipid screening - Lipid panel; Future - Lipid panel  Return in about 6 months (around 08/18/2020) for Chronic condition visit.  Micheline Rough, MD

## 2020-02-18 NOTE — Patient Instructions (Signed)
Melatonin - consider 3-5mg  for starting and can go up to 10mg .

## 2020-02-19 LAB — CBC WITH DIFFERENTIAL/PLATELET
Absolute Monocytes: 384 cells/uL (ref 200–950)
Basophils Absolute: 72 cells/uL (ref 0–200)
Basophils Relative: 1.8 %
Eosinophils Absolute: 140 cells/uL (ref 15–500)
Eosinophils Relative: 3.5 %
HCT: 39.2 % (ref 35.0–45.0)
Hemoglobin: 13 g/dL (ref 11.7–15.5)
Lymphs Abs: 1328 cells/uL (ref 850–3900)
MCH: 30.5 pg (ref 27.0–33.0)
MCHC: 33.2 g/dL (ref 32.0–36.0)
MCV: 92 fL (ref 80.0–100.0)
MPV: 11.7 fL (ref 7.5–12.5)
Monocytes Relative: 9.6 %
Neutro Abs: 2076 cells/uL (ref 1500–7800)
Neutrophils Relative %: 51.9 %
Platelets: 214 10*3/uL (ref 140–400)
RBC: 4.26 10*6/uL (ref 3.80–5.10)
RDW: 12.7 % (ref 11.0–15.0)
Total Lymphocyte: 33.2 %
WBC: 4 10*3/uL (ref 3.8–10.8)

## 2020-02-19 LAB — COMPREHENSIVE METABOLIC PANEL
AG Ratio: 1.8 (calc) (ref 1.0–2.5)
ALT: 42 U/L — ABNORMAL HIGH (ref 6–29)
AST: 29 U/L (ref 10–35)
Albumin: 4.4 g/dL (ref 3.6–5.1)
Alkaline phosphatase (APISO): 39 U/L (ref 37–153)
BUN: 17 mg/dL (ref 7–25)
CO2: 28 mmol/L (ref 20–32)
Calcium: 9.9 mg/dL (ref 8.6–10.4)
Chloride: 104 mmol/L (ref 98–110)
Creat: 0.7 mg/dL (ref 0.60–0.93)
Globulin: 2.4 g/dL (calc) (ref 1.9–3.7)
Glucose, Bld: 96 mg/dL (ref 65–99)
Potassium: 4.1 mmol/L (ref 3.5–5.3)
Sodium: 141 mmol/L (ref 135–146)
Total Bilirubin: 1 mg/dL (ref 0.2–1.2)
Total Protein: 6.8 g/dL (ref 6.1–8.1)

## 2020-02-19 LAB — LIPID PANEL
Cholesterol: 229 mg/dL — ABNORMAL HIGH (ref <200)
HDL: 71 mg/dL (ref >=50)
LDL Cholesterol (Calc): 141 mg/dL (calc) — ABNORMAL HIGH
Non-HDL Cholesterol (Calc): 158 mg/dL (calc) — ABNORMAL HIGH (ref <130)
Total CHOL/HDL Ratio: 3.2 (calc) (ref <5.0)
Triglycerides: 76 mg/dL (ref <150)

## 2020-02-19 LAB — TSH: TSH: 1.44 mIU/L (ref 0.40–4.50)

## 2020-02-19 LAB — VITAMIN D 25 HYDROXY (VIT D DEFICIENCY, FRACTURES): Vit D, 25-Hydroxy: 51 ng/mL (ref 30–100)

## 2020-02-19 LAB — HEMOGLOBIN A1C
Hgb A1c MFr Bld: 5.5 % of total Hgb (ref <5.7)
Mean Plasma Glucose: 111 (calc)
eAG (mmol/L): 6.2 (calc)

## 2020-02-24 ENCOUNTER — Other Ambulatory Visit: Payer: Self-pay | Admitting: Family Medicine

## 2020-06-08 ENCOUNTER — Encounter: Payer: Self-pay | Admitting: Family Medicine

## 2020-06-08 ENCOUNTER — Telehealth: Payer: Self-pay | Admitting: Family Medicine

## 2020-06-08 DIAGNOSIS — U071 COVID-19: Secondary | ICD-10-CM

## 2020-06-08 NOTE — Telephone Encounter (Signed)
Patient phone call -   Tested positive yesterday morning. Friday night mild nasal running; Saturday morning more significant congestion. Some cough; no chest tightness or difficulty with breathing. Temp 98-100. Pulse ox 96-98. Using symbicort regularly.   Will use albuterol bid; continue to monitor symptoms. Advised to let me know if worsening of sx; do feel she would benefit from either infusion or oral antiviral if infusion not available. Spoke with previous pcp and she has had significant issues with reactive airway disease in the past; difficult to wean off steroids.   Told patient referral placed for COVID treatment.

## 2020-06-09 ENCOUNTER — Other Ambulatory Visit: Payer: Self-pay | Admitting: Physician Assistant

## 2020-06-09 ENCOUNTER — Encounter: Payer: Self-pay | Admitting: Family Medicine

## 2020-06-09 ENCOUNTER — Telehealth (HOSPITAL_COMMUNITY): Payer: Self-pay

## 2020-06-09 MED ORDER — NIRMATRELVIR/RITONAVIR (PAXLOVID)TABLET: 3.0000 | ORAL_TABLET | Freq: Two times a day (BID) | ORAL | 0 refills | Status: AC

## 2020-06-09 MED FILL — PAXLOVID 20 X 150 MG & 10 X: 20 X 150 MG | 5 days supply | Qty: 30 | Fill #0

## 2020-06-09 NOTE — Progress Notes (Addendum)
Outpatient Oral COVID Treatment Note  I connected with Courtney [name] on 06/09/2020/9:16 AM by telephone and verified that I am speaking with the correct person using two identifiers.  I discussed the limitations, risks, security, and privacy concerns of performing an evaluation and management service by telephone and the availability of in person appointments. I also discussed with the patient that there may be a patient responsible charge related to this service. The patient expressed understanding and agreed to proceed.  Patient location: home  Provider location: office  Diagnosis: COVID-19 infection  Purpose of visit: Discussion of potential use of Molnupiravir or Paxlovid, a new treatment for mild to moderate COVID-19 viral infection in non-hospitalized patients.   Subjective: Patient is a 75 y.o. female who has been diagnosed with COVID 19 viral infection.  Their symptoms began on 2/4 with cough and cold like symtpoms.  Fully vaccinated and boosted.   Past Medical History:  Diagnosis Date   Breast cancer [facility]) July 2012   post double mastectomy; Dr Humphrey Rolls   Diverticulosis    Family history of breast cancer    GERD (gastroesophageal reflux disease)    S/P dilation X 1   Hemorrhoids    Hypertension    Night sweats    OSA (obstructive sleep apnea) 08/03/2017   Very mild with AHI 7.2/hr.     Prolapsed internal hemorrhoids, grade 3 06/27/2017   Grade 2 on anoscopy exam but grade 3 by history and seen a colonoscopy as well All 3 columns banded 06/27/2017  09/21/2017 banded RP and RA     Reactive airway disease     No Known Allergies   Current Outpatient Medications:    albuterol (PROVENTIL) (2.5 MG/3ML) 0.083% nebulizer solution, Take 3 mLs (2.5 mg total) by nebulization every 6 (six) hours as needed for wheezing or shortness of breath., Disp: 150 mL, Rfl: 1   amLODipine (NORVASC) 5 MG tablet, TAKE 1 TABLET(5 MG) BY MOUTH DAILY, Disp: 90 tablet, Rfl: 1   aspirin 81 MG tablet,  Take 81 mg by mouth daily.  , Disp: , Rfl:    budesonide-formoterol (SYMBICORT) 160-4.5 MCG/ACT inhaler, INHALE 1 TO 2 PUFFS EVERY 12 HOURS AS NEEDED. GARGLE AND SPIT AFTER USE, Disp: 10.2 g, Rfl: 11   calcium citrate-vitamin D (CITRACAL+D) 315-200 MG-UNIT per tablet, Take 1 tablet by mouth daily., Disp: , Rfl:    Carboxymethylcellulose Sod PF (RETAINE CMC) 0.5 % SOLN, Apply to eye., Disp: , Rfl:    Cholecalciferol (VITAMIN D3) 1000 UNITS CAPS, Take by mouth daily.  , Disp: , Rfl:    co-enzyme Q-10 30 MG capsule, Take 50 mg by mouth daily. , Disp: , Rfl:    FINACEA 15 % cream, Apply 1 application topically 2 (two) times daily. For rosacea, Disp: , Rfl: 0   Glucosamine Sulfate 500 MG CAPS, Take 3 capsules by mouth daily., Disp: , Rfl:    hydrochlorothiazide (MICROZIDE) 12.5 MG capsule, TAKE 1 CAPSULE(12.5 MG) BY MOUTH DAILY, Disp: 90 capsule, Rfl: 1   losartan (COZAAR) 100 MG tablet, TAKE 1 TABLET(100 MG) BY MOUTH DAILY, Disp: 90 tablet, Rfl: 1   Misc Natural Products (TART CHERRY ADVANCED PO), Take 2,400 mg by mouth daily., Disp: , Rfl:    montelukast (SINGULAIR) 10 MG tablet, TAKE 1 TABLET(10 MG) BY MOUTH AT BEDTIME, Disp: 90 tablet, Rfl: 3   Multiple Vitamins-Minerals (MULTIVITAMIN WITH MINERALS) tablet, Take 2 tablets by mouth daily.  , Disp: , Rfl:    Multiple Vitamins-Minerals (PRESERVISION AREDS 2  PO), Take by mouth., Disp: , Rfl:    Multiple Vitamins-Minerals (PRESERVISION AREDS PO), Take by mouth daily., Disp: , Rfl:    OMEGA 3 1000 MG CAPS, Take 1,000 mg by mouth daily.  , Disp: , Rfl:    omeprazole (PRILOSEC) 20 MG capsule, TAKE 1 CAPSULE BY MOUTH TWICE DAILY BEFORE A MEAL, Disp: 180 capsule, Rfl: 1   psyllium (METAMUCIL) 58.6 % powder, Take 1 packet by mouth daily., Disp: , Rfl:    triamcinolone (NASACORT ALLERGY 24HR) 55 MCG/ACT AERO nasal inhaler, Place 2 sprays into the nose daily., Disp: , Rfl:   Current Facility-Administered Medications:    albuterol  (PROVENTIL) (2.5 MG/3ML) 0.083% nebulizer solution 2.5 mg, 2.5 mg, Nebulization, Once, Sherren Mocha Jory Ee, MD  Objective: Patient sounds good over the phone .  They are in no apparent distress.  Breathing is non labored.  Mood and behavior are normal.  Laboratory Data:  No results found for this or any previous visit (from the past 2160 hour(s)).   Assessment: 75 y.o. female with mild/moderate COVID 19 viral infection diagnosed on 2/5  at high risk for progression to severe COVID 19.  Plan:  This patient is a 75 y.o. female that meets the following criteria for Emergency Use Authorization of: Paxlovid 1. Age >12 yr AND > 40 kg 2. SARS-COV-2 positive test 3. Symptom onset < 5 days 4. Mild-to-moderate COVID disease with high risk for severe progression to hospitalization or death  I have spoken and communicated the following to the patient or parent/caregiver regarding: 1. Paxlovid is an unapproved drug that is authorized for use under an Emergency Use Authorization.  2. There are no adequate, approved, available products for the treatment of COVID-19 in adults who have mild-to-moderate COVID-19 and are at high risk for progressing to severe COVID-19, including hospitalization or death. 3. Other therapeutics are currently authorized. For additional information on all products authorized for treatment or prevention of COVID-19, please see [employer].  4. There are benefits and risks of taking this treatment as outlined in the Fact Sheet for Patients and Caregivers.  5. Fact Sheet for Patients and Caregivers was reviewed with patient. A hard copy will be provided to patient from pharmacy prior to the patient receiving treatment. 6. Patients should continue to self-isolate and use infection control measures (e.g., wear mask, isolate, social distance, avoid sharing personal items, clean  and disinfect high touch surfaces, and frequent handwashing) according to CDC guidelines.  7. The patient or parent/caregiver has the option to accept or refuse treatment. 8. Patient medication history was reviewed for potential drug interactions:Interaction with home meds: Symbicort, which she will hold during treatment Patients GFR was calculated to be >60, and they were therefore prescribed Normal dose (GFR>60) - nirmatrelvir 150mg  tab (2 tablet) by mouth twice daily AND ritonavir 100mg  tab (1 tablet) by mouth twice daily . Pt had labs in 02/2020 with normal renal function. She has had establish good renal function overtime and eating and drinking well, therefore it is okay to prescribe without rechecking labs.   After reviewing above information with the patient, the patient agrees to receive Paxlovid.  Follow up instructions:     Take prescription BID x 5 days as directed  Reach out to pharmacist for counseling on medication if desired  For concerns regarding further COVID symptoms please follow up with your PCP or urgent care  For urgent or life-threatening issues, seek care at your local emergency department  The patient was provided an opportunity  to ask questions, and all were answered. The patient agreed with the plan and demonstrated an understanding of the instructions.   Script sent to [facility] and opted to pick up RX.  The patient was advised to call their PCP or seek an in-person evaluation if the symptoms worsen or if the condition fails to improve as anticipated.   I provided 15 minutes of non face-to-face telephone visit time during this encounter, and > 50% was spent counseling as documented under my assessment & plan.  Angelena Form, PA-C 06/09/2020 /9:16 AM

## 2020-06-09 NOTE — Telephone Encounter (Signed)
Patient was prescribed oral covid treatment Paxlovid and treatment note was reviewed. Medication has been received by [place] and reviewed for appropriateness.  Drug Interactions or Dosage Adjustments Noted: hold symbicort  Delivery Method: pick up  Patient contacted for counseling on 06/09/20 and verbalized understanding.   Delivery or Pick-Up Date: 06/09/20   Courtney [name] 06/09/2020, 10:55 AM [facility] Outpatient Pharmacist Phone# [phone]

## 2020-06-10 ENCOUNTER — Ambulatory Visit: Payer: Medicare PPO

## 2020-06-19 DIAGNOSIS — Z6823 Body mass index (BMI) 23.0-23.9, adult: Secondary | ICD-10-CM | POA: Diagnosis not present

## 2020-06-19 DIAGNOSIS — Z01419 Encounter for gynecological examination (general) (routine) without abnormal findings: Secondary | ICD-10-CM | POA: Diagnosis not present

## 2020-06-23 NOTE — Progress Notes (Signed)
Subjective:   Courtney [name] is a 75 y.o. female who presents for Medicare Annual (Subsequent) preventive examination.    Review of Systems    N/A  Cardiac Risk Factors include: advanced age (>52men, >105 women)     Objective:    Today's Vitals   06/25/20 1039  BP: 110/76  Pulse: 67  Temp: 97.9 F (36.6 C)  TempSrc: Oral  SpO2: 98%  Weight: 115 lb 5 oz (52.3 kg)  Height: 4\' 9"  (1.448 m)   Body mass index is 24.95 kg/m.  Advanced Directives 06/25/2020 02/01/2020 05/23/2017 08/23/2016 12/29/2015 03/18/2015 12/30/2014  Does Patient Have a Medical Advance Directive? Yes No Yes Yes Yes Yes Yes  Type of Paramedic of [place];Living will - [place];Living will Living will - Living will -  Does patient want to make changes to medical advance directive? No - Patient declined - - - - No - Patient declined -  Copy of [place] in Chart? No - copy requested - No - copy requested - - No - copy requested -  Would patient like information on creating a medical advance directive? - No - Patient declined - - - - -    Current Medications (verified) Outpatient Encounter Medications as of 06/25/2020  Medication Sig   albuterol (PROVENTIL) (2.5 MG/3ML) 0.083% nebulizer solution Take 3 mLs (2.5 mg total) by nebulization every 6 (six) hours as needed for wheezing or shortness of breath.   amLODipine (NORVASC) 5 MG tablet TAKE 1 TABLET(5 MG) BY MOUTH DAILY   aspirin 81 MG tablet Take 81 mg by mouth daily.   budesonide-formoterol (SYMBICORT) 160-4.5 MCG/ACT inhaler INHALE 1 TO 2 PUFFS EVERY 12 HOURS AS NEEDED. GARGLE AND SPIT AFTER USE   calcium citrate-vitamin D (CITRACAL+D) 315-200 MG-UNIT per tablet Take 1 tablet by mouth daily.   Carboxymethylcellulose Sod PF (RETAINE CMC) 0.5 % SOLN Apply to eye.   Cholecalciferol (VITAMIN D3) 1000 UNITS CAPS Take by mouth daily.   co-enzyme Q-10 30 MG capsule Take 50 mg by mouth daily.    FINACEA 15 % cream Apply 1 application topically 2 (two) times daily. For rosacea   hydrochlorothiazide (MICROZIDE) 12.5 MG capsule TAKE 1 CAPSULE(12.5 MG) BY MOUTH DAILY   losartan (COZAAR) 100 MG tablet TAKE 1 TABLET(100 MG) BY MOUTH DAILY   Misc Natural Products (TART CHERRY ADVANCED PO) Take 2,400 mg by mouth daily.   montelukast (SINGULAIR) 10 MG tablet TAKE 1 TABLET(10 MG) BY MOUTH AT BEDTIME   Multiple Vitamins-Minerals (MULTIVITAMIN WITH MINERALS) tablet Take 1 tablet by mouth daily.   Multiple Vitamins-Minerals (PRESERVISION AREDS 2 PO) Take by mouth.   OMEGA 3 1000 MG CAPS Take 1,000 mg by mouth daily.   omeprazole (PRILOSEC) 20 MG capsule TAKE 1 CAPSULE BY MOUTH TWICE DAILY BEFORE A MEAL   psyllium (METAMUCIL) 58.6 % powder Take 1 packet by mouth daily.   [DISCONTINUED] Glucosamine Sulfate 500 MG CAPS Take 3 capsules by mouth daily.   [DISCONTINUED] Multiple Vitamins-Minerals (PRESERVISION AREDS PO) Take by mouth daily.   [DISCONTINUED] triamcinolone (NASACORT ALLERGY 24HR) 55 MCG/ACT AERO nasal inhaler Place 2 sprays into the nose daily.   Facility-Administered Encounter Medications as of 06/25/2020  Medication   albuterol (PROVENTIL) (2.5 MG/3ML) 0.083% nebulizer solution 2.5 mg    Allergies (verified) Patient has no known allergies.   History: Past Medical History:  Diagnosis Date   Breast cancer [facility] July 2012   post double mastectomy; Dr Humphrey Rolls  Diverticulosis    Family history of breast cancer    GERD (gastroesophageal reflux disease)    S/P dilation X 1   Hemorrhoids    Hypertension    Night sweats    OSA (obstructive sleep apnea) 08/03/2017   Very mild with AHI 7.2/hr.     Prolapsed internal hemorrhoids, grade 3 06/27/2017   Grade 2 on anoscopy exam but grade 3 by history and seen a colonoscopy as well All 3 columns banded 06/27/2017  09/21/2017 banded RP and RA     Reactive airway disease    Past Surgical History:  Procedure Laterality  Date   ABDOMINAL HYSTERECTOMY     bilateral breast reconstruction  Jan 2013   BREAST SURGERY  01/13/11   bilateral total mastectomy; Dr Dalbert Batman   COLONOSCOPY  2014   Dr Olevia Perches; hyperplastic polyp   ESOPHAGEAL DILATION  2014   HEMORRHOID BANDING  2019   February and May   NASAL SEPTUM SURGERY     RECONSTRUCTION / CORRECTION OF NIPPLE / Tresa Moore  June 2013   THYROGLOSSAL DUCT CYST     Family History  Problem Relation Age of Onset   Breast cancer Mother 39   Colon cancer Mother 49   Cervical cancer Mother 77       s/p hysterectomy   Heart attack Father 63   Diabetes Father        probable pre DM   Skin cancer Father        maybe melanoma   Cancer Sister 29       appendiceal   Heart attack Maternal Grandfather 57   Heart attack Paternal Uncle        X2 ; >50y   Heart attack Maternal Uncle        d. 60   Parkinson's disease Maternal Grandmother        d. 22   Heart attack Paternal Grandfather    Breast cancer Other        unspecified age; maternal great aunt (MGM's sister)   Breast cancer Cousin        maternal 1st cousin, once-removed; unspecified age   Heart attack Paternal Uncle        >50y   Stroke Neg Hx    Social History   Socioeconomic History   Marital status: Married    Spouse name: Not on file   Number of children: Not on file   Years of education: Not on file   Highest education level: Not on file  Occupational History   Not on file  Tobacco Use   Smoking status: Former Smoker    Years: 5.00    Types: Cigarettes    Quit date: 05/03/1965    Years since quitting: 55.1   Smokeless tobacco: Never Used   Tobacco comment: smoked 1962-1967, less than 1 ppd  Vaping Use   Vaping Use: Never used  Substance and Sexual Activity   Alcohol use: Yes    Alcohol/week: 14.0 standard drinks    Types: 14 Glasses of wine per week    Comment: 2-3 /day   Drug use: No   Sexual activity: Yes  Other Topics Concern   Not on file   Social History Narrative   Married   2-3 glasses wine/day   remote small vol smmoker   Social Determinants of Health   Financial Resource Strain: Low Risk    Difficulty of Paying Living Expenses: Not hard at all  Food Insecurity: No Food Insecurity  Worried About Charity fundraiser in the Last Year: Never true   [place] in the Last Year: Never true  Transportation Needs: No Transportation Needs   Lack of Transportation (Medical): No   Lack of Transportation (Non-Medical): No  Physical Activity: Sufficiently Active   Days of Exercise per Week: 5 days   Minutes of Exercise per Session: 30 min  Stress: No Stress Concern Present   Feeling of Stress : Not at all  Social Connections: Socially Integrated   Frequency of Communication with Friends and Family: More than three times a week   Frequency of Social Gatherings with Friends and Family: More than three times a week   Attends Religious Services: More than 4 times per year   Active Member of [employer] or Organizations: Yes   Attends Music therapist: More than 4 times per year   Marital Status: Married    Tobacco Counseling Counseling given: Not Answered Comment: smoked [phone], less than 1 ppd   Clinical Intake:  Pre-visit preparation completed: Yes  Pain : No/denies pain     Nutritional Risks: None Diabetes: No  How often do you need to have someone help you when you read instructions, pamphlets, or other written materials from your doctor or pharmacy?: 1 - Never What is the last grade level you completed in school?: College  Diabetic?No   Interpreter Needed?: No  Information entered by :: [place] of Daily Living In your present state of health, do you have any difficulty performing the following activities: 06/25/2020  Hearing? N  Vision? N  Difficulty concentrating or making decisions? N  Walking or climbing stairs? N  Dressing or bathing? N  Doing  errands, shopping? N  Preparing Food and eating ? N  Using the Toilet? N  In the past six months, have you accidently leaked urine? Y  Comment occassional leakage with urgency  Do you have problems with loss of bowel control? N  Managing your Medications? N  Managing your Finances? N  Housekeeping or managing your Housekeeping? N  Some recent data might be hidden    Patient Care Team: Caren Macadam, MD as PCP - General (Family Medicine) Magrinat, Virgie Dad, MD as Consulting Physician (Oncology) Delice Bison, Charlestine Massed, NP as Nurse Practitioner (Hematology and Oncology) Rolm Bookbinder, MD as Consulting Physician (Dermatology)  Indicate any recent Medical Services you may have received from other than [facility] providers in the past year (date may be approximate).     Assessment:   This is a routine wellness examination for Courtney [name].  Hearing/Vision screen  Hearing Screening   125Hz  250Hz  500Hz  1000Hz  2000Hz  3000Hz  4000Hz  6000Hz  8000Hz   Right ear:           Left ear:           Vision Screening Comments: Gets eyes examined once per year. Wears one contact for up close reading    Dietary issues and exercise activities discussed: Current Exercise Habits: Home exercise routine, Type of exercise: walking, Time (Minutes): 30, Frequency (Times/Week): 5, Weekly Exercise (Minutes/Week): 150, Intensity: Moderate  Goals     Patient Stated     I will continue to walk daily!       Depression Screen PHQ 2/9 Scores 06/25/2020 02/18/2020 01/29/2019 01/10/2018 01/10/2017 01/06/2016 07/29/2015  PHQ - 2 Score 0 0 0 0 0 0 0    Fall Risk Fall Risk  06/25/2020 02/18/2020 01/29/2019 01/10/2018 01/10/2017  Falls in the past year? 0  0 0 No No  Number falls in past yr: 0 0 0 - -  Injury with Fall? 0 0 0 - -  Risk for fall due to : No Fall Risks - - - -  Follow up Falls evaluation completed;Falls prevention discussed - - - -    FALL RISK PREVENTION PERTAINING TO THE HOME:  Any stairs in or around the  home? Yes  If so, are there any without handrails? No  Home free of loose throw rugs in walkways, pet beds, electrical cords, etc? Yes  Adequate lighting in your home to reduce risk of falls? Yes   ASSISTIVE DEVICES UTILIZED TO PREVENT FALLS:  Life alert? No  Use of a cane, walker or w/c? No  Grab bars in the bathroom? No  Shower chair or bench in shower? No  Elevated toilet seat or a handicapped toilet? No   TIMED UP AND GO:  Was the test performed? Yes .  Length of time to ambulate 10 feet: 3 sec.   Gait steady and fast without use of assistive device  Cognitive Function:        Immunizations Immunization History  Administered Date(s) Administered   Fluad Quad(high Dose 65+) 01/29/2019   Influenza Split 02/18/2012   Influenza, High Dose Seasonal PF 02/16/2013, 03/06/2015, 01/06/2016, 01/10/2017, 03/07/2018   Influenza,inj,Quad PF,6+ Mos 02/19/2014   Influenza-Unspecified 12/31/2015, 01/29/2019, 01/24/2020   PFIZER(Purple Top)SARS-COV-2 Vaccination 05/24/2019, 06/14/2019, 01/24/2020   PPD Test 04/17/2012   Pneumococcal Conjugate-13 01/03/2015   Pneumococcal Polysaccharide-23 05/03/2008, 02/22/2013   Tdap 02/18/2012   Zoster 01/14/2010   Zoster Recombinat (Shingrix) 03/21/2017, 03/16/2018, 03/16/2018    TDAP status: Up to date  Flu Vaccine status: Up to date  Pneumococcal vaccine status: Up to date  Covid-19 vaccine status: Completed vaccines  Qualifies for Shingles Vaccine? Yes   Zostavax completed Yes   Shingrix Completed?: Yes  Screening Tests Health Maintenance  Topic Date Due   COVID-19 Vaccine (4 - Booster for Pfizer series) 07/23/2020   TETANUS/TDAP  02/17/2022   INFLUENZA VACCINE  Completed   DEXA SCAN  Completed   Hepatitis C Screening  Completed   PNA vac Low Risk Adult  Completed    Health Maintenance  There are no preventive care reminders to display for this patient.  Colorectal cancer screening: Type of screening:  Colonoscopy. Completed 04/08/2017. Repeat every 5 years  Mammogram status: No longer required due to age .  Bone Density status: Completed 11/15/2017. Results reflect: Bone density results: OSTEOPENIA. Repeat every 5 years.  Lung Cancer Screening: (Low Dose CT Chest recommended if Age 36-80 years, 30 pack-year currently smoking OR have quit w/in 15years.) does not qualify.   Lung Cancer Screening Referral: N/A   Additional Screening:  Hepatitis C Screening: does qualify; Completed 12/30/2015  Vision Screening: Recommended annual ophthalmology exams for early detection of glaucoma and other disorders of the eye. Is the patient up to date with their annual eye exam?  Yes  Who is the provider or what is the name of the office in which the patient attends annual eye exams? Dr. Mardene Speak If pt is not established with a provider, would they like to be referred to a provider to establish care? No .   Dental Screening: Recommended annual dental exams for proper oral hygiene  Community Resource Referral / Chronic Care Management: CRR required this visit?  No   CCM required this visit?  No      Plan:     I have  personally reviewed and noted the following in the patients chart:    Medical and social history  Use of alcohol, tobacco or illicit drugs   Current medications and supplements  Functional ability and status  Nutritional status  Physical activity  Advanced directives  List of other physicians  Hospitalizations, surgeries, and ER visits in previous 12 months  Vitals  Screenings to include cognitive, depression, and falls  Referrals and appointments  In addition, I have reviewed and discussed with patient certain preventive protocols, quality metrics, and best practice recommendations. A written personalized care plan for preventive services as well as general preventive health recommendations were provided to patient.     Ofilia Neas, LPN   9/37/1696    Nurse Notes: None

## 2020-06-25 ENCOUNTER — Other Ambulatory Visit: Payer: Self-pay

## 2020-06-25 ENCOUNTER — Ambulatory Visit: Payer: Medicare PPO

## 2020-06-25 VITALS — BP 110/76 | HR 67 | Temp 97.9°F | Ht <= 58 in | Wt 115.3 lb

## 2020-06-25 DIAGNOSIS — Z0000 Encounter for general adult medical examination without abnormal findings: Secondary | ICD-10-CM | POA: Diagnosis not present

## 2020-06-25 NOTE — Patient Instructions (Signed)
[name] , Thank you for taking time to come for your Medicare Wellness Visit. I appreciate your ongoing commitment to your health goals. Please review the following plan we discussed and let me know if I can assist you in the future.   Screening recommendations/referrals: Colonoscopy: Up to date, next due 04/08/2022 Mammogram: No longer required  Bone Density: Up to date ,next due 11/16/2022 Recommended yearly ophthalmology/optometry visit for glaucoma screening and checkup Recommended yearly dental visit for hygiene and checkup  Vaccinations: Influenza vaccine: Up to date, next fall 2022 Pneumococcal vaccine: Completed series Tdap vaccine: Up to date, next due 02/17/2022 Shingles vaccine: Completed series    Advanced directives: Please bring copies of your advanced medical directives into our office so that we may scan them into your chart.   Conditions/risks identified: None  Next appointment: 08/20/2020 @ 9:00 am with Dr. Ethlyn Gallery    Preventive Care 65 Years and Older, Female Preventive care refers to lifestyle choices and visits with your health care provider that can promote health and wellness. What does preventive care include?  A yearly physical exam. This is also called an annual well check.  Dental exams once or twice a year.  Routine eye exams. Ask your health care provider how often you should have your eyes checked.  Personal lifestyle choices, including:  Daily care of your teeth and gums.  Regular physical activity.  Eating a healthy diet.  Avoiding tobacco and drug use.  Limiting alcohol use.  Practicing safe sex.  Taking low-dose aspirin every day.  Taking vitamin and mineral supplements as recommended by your health care provider. What happens during an annual well check? The services and screenings done by your health care provider during your annual well check will depend on your age, overall health, lifestyle risk factors, and family history  of disease. Counseling  Your health care provider may ask you questions about your:  Alcohol use.  Tobacco use.  Drug use.  Emotional well-being.  Home and relationship well-being.  Sexual activity.  Eating habits.  History of falls.  Memory and ability to understand (cognition).  Work and work Statistician.  Reproductive health. Screening  You may have the following tests or measurements:  Height, weight, and BMI.  Blood pressure.  Lipid and cholesterol levels. These may be checked every 5 years, or more frequently if you are over 77 years old.  Skin check.  Lung cancer screening. You may have this screening every year starting at age 29 if you have a 30-pack-year history of smoking and currently smoke or have quit within the past 15 years.  Fecal occult blood test (FOBT) of the stool. You may have this test every year starting at age 75.  Flexible sigmoidoscopy or colonoscopy. You may have a sigmoidoscopy every 5 years or a colonoscopy every 10 years starting at age 46.  Hepatitis C blood test.  Hepatitis B blood test.  Sexually transmitted disease (STD) testing.  Diabetes screening. This is done by checking your blood sugar (glucose) after you have not eaten for a while (fasting). You may have this done every 1-3 years.  Bone density scan. This is done to screen for osteoporosis. You may have this done starting at age 51.  Mammogram. This may be done every 1-2 years. Talk to your health care provider about how often you should have regular mammograms. Talk with your health care provider about your test results, treatment options, and if necessary, the need for more tests. Vaccines  Your  health care provider may recommend certain vaccines, such as:  Influenza vaccine. This is recommended every year.  Tetanus, diphtheria, and acellular pertussis (Tdap, Td) vaccine. You may need a Td booster every 10 years.  Zoster vaccine. You may need this after age  9.  Pneumococcal 13-valent conjugate (PCV13) vaccine. One dose is recommended after age 54.  Pneumococcal polysaccharide (PPSV23) vaccine. One dose is recommended after age 68. Talk to your health care provider about which screenings and vaccines you need and how often you need them. This information is not intended to replace advice given to you by your health care provider. Make sure you discuss any questions you have with your health care provider. Document Released: 05/16/2015 Document Revised: 01/07/2016 Document Reviewed: 02/18/2015 Elsevier Interactive Patient Education  2017 [place] Prevention in the Home Falls can cause injuries. They can happen to people of all ages. There are many things you can do to make your home safe and to help prevent falls. What can I do on the outside of my home?  Regularly fix the edges of walkways and driveways and fix any cracks.  Remove anything that might make you trip as you walk through a door, such as a raised step or threshold.  Trim any bushes or trees on the path to your home.  Use bright outdoor lighting.  Clear any walking paths of anything that might make someone trip, such as rocks or tools.  Regularly check to see if handrails are loose or broken. Make sure that both sides of any steps have handrails.  Any raised decks and porches should have guardrails on the edges.  Have any leaves, snow, or ice cleared regularly.  Use sand or salt on walking paths during winter.  Clean up any spills in your garage right away. This includes oil or grease spills. What can I do in the bathroom?  Use night lights.  Install grab bars by the toilet and in the tub and shower. Do not use towel bars as grab bars.  Use non-skid mats or decals in the tub or shower.  If you need to sit down in the shower, use a plastic, non-slip stool.  Keep the floor dry. Clean up any water that spills on the floor as soon as it happens.  Remove  soap buildup in the tub or shower regularly.  Attach bath mats securely with double-sided non-slip rug tape.  Do not have throw rugs and other things on the floor that can make you trip. What can I do in the bedroom?  Use night lights.  Make sure that you have a light by your bed that is easy to reach.  Do not use any sheets or blankets that are too big for your bed. They should not hang down onto the floor.  Have a firm chair that has side arms. You can use this for support while you get dressed.  Do not have throw rugs and other things on the floor that can make you trip. What can I do in the kitchen?  Clean up any spills right away.  Avoid walking on wet floors.  Keep items that you use a lot in easy-to-reach places.  If you need to reach something above you, use a strong step stool that has a grab bar.  Keep electrical cords out of the way.  Do not use floor polish or wax that makes floors slippery. If you must use wax, use non-skid floor wax.  Do not  have throw rugs and other things on the floor that can make you trip. What can I do with my stairs?  Do not leave any items on the stairs.  Make sure that there are handrails on both sides of the stairs and use them. Fix handrails that are broken or loose. Make sure that handrails are as long as the stairways.  Check any carpeting to make sure that it is firmly attached to the stairs. Fix any carpet that is loose or worn.  Avoid having throw rugs at the top or bottom of the stairs. If you do have throw rugs, attach them to the floor with carpet tape.  Make sure that you have a light switch at the top of the stairs and the bottom of the stairs. If you do not have them, ask someone to add them for you. What else can I do to help prevent falls?  Wear shoes that:  Do not have high heels.  Have rubber bottoms.  Are comfortable and fit you well.  Are closed at the toe. Do not wear sandals.  If you use a  stepladder:  Make sure that it is fully opened. Do not climb a closed stepladder.  Make sure that both sides of the stepladder are locked into place.  Ask someone to hold it for you, if possible.  Clearly mark and make sure that you can see:  Any grab bars or handrails.  First and last steps.  Where the edge of each step is.  Use tools that help you move around (mobility aids) if they are needed. These include:  Canes.  Walkers.  Scooters.  Crutches.  Turn on the lights when you go into a dark area. Replace any light bulbs as soon as they burn out.  Set up your furniture so you have a clear path. Avoid moving your furniture around.  If any of your floors are uneven, fix them.  If there are any pets around you, be aware of where they are.  Review your medicines with your doctor. Some medicines can make you feel dizzy. This can increase your chance of falling. Ask your doctor what other things that you can do to help prevent falls. This information is not intended to replace advice given to you by your health care provider. Make sure you discuss any questions you have with your health care provider. Document Released: 02/13/2009 Document Revised: 09/25/2015 Document Reviewed: 05/24/2014 Elsevier Interactive Patient Education  2017 [employer].

## 2020-07-01 DIAGNOSIS — C44629 Squamous cell carcinoma of skin of left upper limb, including shoulder: Secondary | ICD-10-CM | POA: Diagnosis not present

## 2020-07-01 DIAGNOSIS — Z85828 Personal history of other malignant neoplasm of skin: Secondary | ICD-10-CM | POA: Diagnosis not present

## 2020-07-01 DIAGNOSIS — D485 Neoplasm of uncertain behavior of skin: Secondary | ICD-10-CM | POA: Diagnosis not present

## 2020-07-01 DIAGNOSIS — M720 Palmar fascial fibromatosis [Dupuytren]: Secondary | ICD-10-CM | POA: Diagnosis not present

## 2020-07-01 DIAGNOSIS — L821 Other seborrheic keratosis: Secondary | ICD-10-CM | POA: Diagnosis not present

## 2020-07-01 DIAGNOSIS — L570 Actinic keratosis: Secondary | ICD-10-CM | POA: Diagnosis not present

## 2020-07-08 ENCOUNTER — Encounter: Payer: Self-pay | Admitting: Family Medicine

## 2020-07-29 ENCOUNTER — Encounter: Payer: Self-pay | Admitting: Family Medicine

## 2020-08-04 DIAGNOSIS — I8312 Varicose veins of left lower extremity with inflammation: Secondary | ICD-10-CM | POA: Diagnosis not present

## 2020-08-04 DIAGNOSIS — I8311 Varicose veins of right lower extremity with inflammation: Secondary | ICD-10-CM | POA: Diagnosis not present

## 2020-08-12 ENCOUNTER — Other Ambulatory Visit: Payer: Self-pay | Admitting: Family Medicine

## 2020-08-16 ENCOUNTER — Other Ambulatory Visit: Payer: Self-pay | Admitting: Family Medicine

## 2020-08-20 ENCOUNTER — Ambulatory Visit: Payer: Medicare PPO | Admitting: Family Medicine

## 2020-08-27 ENCOUNTER — Ambulatory Visit: Payer: Medicare PPO | Admitting: Family Medicine

## 2020-09-01 ENCOUNTER — Telehealth: Payer: Self-pay | Admitting: Internal Medicine

## 2020-09-01 MED ORDER — AZITHROMYCIN 250 MG PO TABS: 250.0000 mg | ORAL_TABLET | ORAL | 0 refills | Status: DC

## 2020-09-01 NOTE — Telephone Encounter (Signed)
Spoke with the pt and notified of response per MW. She verbalized understanding. Rx sent. She will call for ov/televisit if not improving.

## 2020-09-01 NOTE — Telephone Encounter (Signed)
ATC patient.  LMTCB. 

## 2020-09-01 NOTE — Telephone Encounter (Signed)
zpak F/u by end of week if not better, at least televisit

## 2020-09-01 NOTE — Telephone Encounter (Signed)
Called and spoke with patient who states that she just came back from international travel. Started productive cough with yellow sputum on last Friday 28th that has gotten worse; taking over the counter cough medicine, mucus control, predinisone and albuterol. Vaccinated for Covid and was tested to come back into the states and it was negative. Patient has appt at 34 and said to call her cell phone [phone] and gives permission to leave voicemail. Pharmacy of choice: [facility] on [address]   Dr. Melvyn Novas please advise

## 2020-09-10 ENCOUNTER — Ambulatory Visit: Payer: Medicare PPO | Admitting: Family Medicine

## 2020-09-10 ENCOUNTER — Other Ambulatory Visit: Payer: Self-pay

## 2020-09-10 ENCOUNTER — Encounter: Payer: Self-pay | Admitting: Family Medicine

## 2020-09-10 VITALS — BP 120/62 | HR 61 | Temp 97.6°F | Ht <= 58 in | Wt 115.7 lb

## 2020-09-10 DIAGNOSIS — G4733 Obstructive sleep apnea (adult) (pediatric): Secondary | ICD-10-CM

## 2020-09-10 DIAGNOSIS — R7301 Impaired fasting glucose: Secondary | ICD-10-CM

## 2020-09-10 DIAGNOSIS — J4520 Mild intermittent asthma, uncomplicated: Secondary | ICD-10-CM

## 2020-09-10 DIAGNOSIS — I10 Essential (primary) hypertension: Secondary | ICD-10-CM | POA: Diagnosis not present

## 2020-09-10 DIAGNOSIS — Z170 Estrogen receptor positive status [ER+]: Secondary | ICD-10-CM | POA: Diagnosis not present

## 2020-09-10 DIAGNOSIS — M8580 Other specified disorders of bone density and structure, unspecified site: Secondary | ICD-10-CM | POA: Diagnosis not present

## 2020-09-10 DIAGNOSIS — Z13220 Encounter for screening for lipoid disorders: Secondary | ICD-10-CM | POA: Diagnosis not present

## 2020-09-10 DIAGNOSIS — K219 Gastro-esophageal reflux disease without esophagitis: Secondary | ICD-10-CM

## 2020-09-10 DIAGNOSIS — C50911 Malignant neoplasm of unspecified site of right female breast: Secondary | ICD-10-CM

## 2020-09-10 MED ORDER — BUDESONIDE-FORMOTEROL FUMARATE 160-4.5 MCG/ACT IN AERO: INHALATION_SPRAY | RESPIRATORY_TRACT | 11 refills | Status: DC

## 2020-09-10 NOTE — Progress Notes (Signed)
ECE CUMBERLAND DOB: Mar 02, 1946 Encounter date: 09/10/2020  This is a 75 y.o. female who presents with Chief Complaint  Patient presents with  . Follow-up    History of present illness: Patient did test positive in February and received approximately treatment for COVID infection.  Last visit with me was 02/18/2020.  Hypertension: Amlodipine 5 mg daily, losartan 100 mg daily, hydrochlorothiazide 12.5 mg daily.  She is compliant with medications and does not have side effects.  Mild intermittent asthma: Symbicort, Singulair, albuterol as needed. Does feel like she huffs and puffs sometimes when walking around. Did just have bronchitis; was on river cruise- regular cleaning/sanitizing triggered bronchitis for her. Did get z pack. Cleared up infection - improved the type of sputum. Still using albuterol and symbicort; still on prednisone - 10mg  daily dose. Using albuterol BID in nebulizer. symbicort is used with spacer. Uses the albuterol before the symbicort. Taking cough medication before she goes to bed; coughing more in morning.   Sleep apnea: Uses oral appliance - also keeps her from coughing.   GERD: Omeprazole - uses this daily; reflux flares just with certain foods - ie fatty.   Osteopenia: Recheck DEXA in 10/2020.  Personal history of breast cancer: Follows with survivorship clinic.  She does get regular mammograms through [facility].   No Known Allergies Current Meds  Medication Sig  . albuterol (PROVENTIL) (2.5 MG/3ML) 0.083% nebulizer solution Take 3 mLs (2.5 mg total) by nebulization every 6 (six) hours as needed for wheezing or shortness of breath.  Marland Kitchen amLODipine (NORVASC) 5 MG tablet TAKE 1 TABLET(5 MG) BY MOUTH DAILY  . aspirin 81 MG tablet Take 81 mg by mouth daily.  . calcium citrate-vitamin D (CITRACAL+D) 315-200 MG-UNIT per tablet Take 1 tablet by mouth daily.  . Carboxymethylcellulose Sod PF (RETAINE CMC) 0.5 % SOLN Apply to eye.  . Cholecalciferol (VITAMIN D3) 1000 UNITS  CAPS Take by mouth daily.  Marland Kitchen co-enzyme Q-10 30 MG capsule Take 50 mg by mouth daily.  Marland Kitchen FINACEA 15 % cream Apply 1 application topically 2 (two) times daily. For rosacea  . hydrochlorothiazide (MICROZIDE) 12.5 MG capsule TAKE 1 CAPSULE(12.5 MG) BY MOUTH DAILY  . losartan (COZAAR) 100 MG tablet TAKE 1 TABLET(100 MG) BY MOUTH DAILY  . Misc Natural Products (TART CHERRY ADVANCED PO) Take 2,400 mg by mouth daily.  . montelukast (SINGULAIR) 10 MG tablet TAKE 1 TABLET(10 MG) BY MOUTH AT BEDTIME  . Multiple Vitamins-Minerals (MULTIVITAMIN WITH MINERALS) tablet Take 1 tablet by mouth daily.  . Multiple Vitamins-Minerals (PRESERVISION AREDS 2 PO) Take by mouth.  . OMEGA 3 1000 MG CAPS Take 1,000 mg by mouth daily.  Marland Kitchen omeprazole (PRILOSEC) 20 MG capsule TAKE 1 CAPSULE BY MOUTH TWICE DAILY BEFORE A MEAL  . predniSONE (DELTASONE) 20 MG tablet Take 20 mg by mouth.  . psyllium (METAMUCIL) 58.6 % powder Take 1 packet by mouth daily.  . [DISCONTINUED] budesonide-formoterol (SYMBICORT) 160-4.5 MCG/ACT inhaler INHALE 1 TO 2 PUFFS EVERY 12 HOURS AS NEEDED. GARGLE AND SPIT AFTER USE   Current Facility-Administered Medications for the 09/10/20 encounter (Office Visit) with Caren Macadam, MD  Medication  . albuterol (PROVENTIL) (2.5 MG/3ML) 0.083% nebulizer solution 2.5 mg    Review of Systems  Constitutional: Negative for chills, fatigue and fever.  HENT: Negative for congestion.   Respiratory: Positive for cough and shortness of breath. Negative for chest tightness and wheezing.   Cardiovascular: Negative for chest pain, palpitations and leg swelling.    Objective:  BP  120/62 (BP Location: Left Arm, Patient Position: Sitting, Cuff Size: Normal)   Pulse 61   Temp 97.6 F (36.4 C) (Oral)   Ht 4\' 9"  (1.448 m)   Wt 115 lb 11.2 oz (52.5 kg)   SpO2 99%   BMI 25.04 kg/m   Weight: 115 lb 11.2 oz (52.5 kg)   BP Readings from Last 3 Encounters:  09/10/20 120/62  06/25/20 110/76  02/18/20 118/62    Wt Readings from Last 3 Encounters:  09/10/20 115 lb 11.2 oz (52.5 kg)  06/25/20 115 lb 5 oz (52.3 kg)  02/18/20 114 lb 12.8 oz (52.1 kg)    Physical Exam Constitutional:      General: She is not in acute distress.    Appearance: She is well-developed.  Cardiovascular:     Rate and Rhythm: Normal rate and regular rhythm.     Heart sounds: Normal heart sounds. No murmur heard. No friction rub.  Pulmonary:     Effort: Pulmonary effort is normal. No respiratory distress.     Breath sounds: Normal breath sounds. No wheezing or rales.  Musculoskeletal:     Right lower leg: No edema.     Left lower leg: No edema.  Neurological:     Mental Status: She is alert and oriented to person, place, and time.  Psychiatric:        Behavior: Behavior normal.     Assessment/Plan  (she did get 4th COVID shot; not sure of date) 1. Primary hypertension Well-controlled.  Continue current medications: Milligrams daily, hydrochlorothiazide 12.5 mg daily, losartan 100 mg daily.  2. Mild intermittent asthma, unspecified whether complicated Symbicort 161 to 2 puffs twice daily, albuterol as needed.  She is well controlled with this.  When she had a recent flareup of bronchitis, she started prednisone that she had on hand at home.  I have instructed her without a taper this back.  We will hold off on doing blood work today because this may affect her lab results.  3. OSA (obstructive sleep apnea) Using oral appliance and tolerating this well.  4. Gastroesophageal reflux disease, unspecified whether esophagitis present Intermittent symptoms improved with omeprazole when needed.  5. Osteopenia, unspecified location She does work on getting regular weightbearing exercise and is taking calcium/vitamin D regularly.  6. Malignant neoplasm of right breast in female, estrogen receptor positive, unspecified site of breast ([place]) Follows with survivor clinic.  We will try to get recent mammogram reports to  scan into chart (getting [facility]; will have her sign release today)    Return for lab visit in 2-3 weeks; follow up with me for physical in 6 months.    Micheline Rough, MD

## 2020-09-10 NOTE — Patient Instructions (Addendum)
Start 5mg  daily of prednisone x 4 days; then 2.5mg  (half tab of 5mg ) x 2 days, then stop.   Increase the symbicort to 2 puffs twice daily x 2 weeks and then return to 1 puff twice daily.

## 2020-09-24 ENCOUNTER — Other Ambulatory Visit: Payer: Self-pay

## 2020-09-25 ENCOUNTER — Other Ambulatory Visit (INDEPENDENT_AMBULATORY_CARE_PROVIDER_SITE_OTHER): Payer: Medicare PPO

## 2020-09-25 DIAGNOSIS — I10 Essential (primary) hypertension: Secondary | ICD-10-CM

## 2020-09-25 DIAGNOSIS — Z13220 Encounter for screening for lipoid disorders: Secondary | ICD-10-CM | POA: Diagnosis not present

## 2020-09-25 DIAGNOSIS — R7301 Impaired fasting glucose: Secondary | ICD-10-CM | POA: Diagnosis not present

## 2020-09-25 LAB — COMPREHENSIVE METABOLIC PANEL
ALT: 38 U/L — ABNORMAL HIGH (ref 0–35)
AST: 25 U/L (ref 0–37)
Albumin: 4.4 g/dL (ref 3.5–5.2)
Alkaline Phosphatase: 41 U/L (ref 39–117)
BUN: 15 mg/dL (ref 6–23)
CO2: 29 mEq/L (ref 19–32)
Calcium: 9.4 mg/dL (ref 8.4–10.5)
Chloride: 105 mEq/L (ref 96–112)
Creatinine, Ser: 0.71 mg/dL (ref 0.40–1.20)
GFR: 82.38 mL/min (ref >60.00)
Glucose, Bld: 94 mg/dL (ref 70–99)
Potassium: 4.1 mEq/L (ref 3.5–5.1)
Sodium: 143 mEq/L (ref 135–145)
Total Bilirubin: 0.6 mg/dL (ref 0.2–1.2)
Total Protein: 6.8 g/dL (ref 6.0–8.3)

## 2020-09-25 LAB — LIPID PANEL
Cholesterol: 223 mg/dL — ABNORMAL HIGH (ref 0–200)
HDL: 61.1 mg/dL (ref >39.00)
LDL Cholesterol: 138 mg/dL — ABNORMAL HIGH (ref 0–99)
NonHDL: 161.41
Total CHOL/HDL Ratio: 4
Triglycerides: 119 mg/dL (ref 0.0–149.0)
VLDL: 23.8 mg/dL (ref 0.0–40.0)

## 2020-09-25 LAB — CBC WITH DIFFERENTIAL/PLATELET
Basophils Absolute: 0 10*3/uL (ref 0.0–0.1)
Basophils Relative: 1.2 % (ref 0.0–3.0)
Eosinophils Absolute: 0.2 10*3/uL (ref 0.0–0.7)
Eosinophils Relative: 5.8 % — ABNORMAL HIGH (ref 0.0–5.0)
HCT: 39.3 % (ref 36.0–46.0)
Hemoglobin: 13.1 g/dL (ref 12.0–15.0)
Lymphocytes Relative: 38.7 % (ref 12.0–46.0)
Lymphs Abs: 1.5 10*3/uL (ref 0.7–4.0)
MCHC: 33.3 g/dL (ref 30.0–36.0)
MCV: 92.6 fl (ref 78.0–100.0)
Monocytes Absolute: 0.3 10*3/uL (ref 0.1–1.0)
Monocytes Relative: 6.7 % (ref 3.0–12.0)
Neutro Abs: 1.8 10*3/uL (ref 1.4–7.7)
Neutrophils Relative %: 47.6 % (ref 43.0–77.0)
Platelets: 214 10*3/uL (ref 150.0–400.0)
RBC: 4.24 Mil/uL (ref 3.87–5.11)
RDW: 13.5 % (ref 11.5–15.5)
WBC: 3.8 10*3/uL — ABNORMAL LOW (ref 4.0–10.5)

## 2020-09-25 LAB — HEMOGLOBIN A1C: Hgb A1c MFr Bld: 6 % (ref 4.6–6.5)

## 2020-10-15 DIAGNOSIS — M7742 Metatarsalgia, left foot: Secondary | ICD-10-CM | POA: Diagnosis not present

## 2020-10-29 DIAGNOSIS — L570 Actinic keratosis: Secondary | ICD-10-CM | POA: Diagnosis not present

## 2020-10-29 DIAGNOSIS — Z85828 Personal history of other malignant neoplasm of skin: Secondary | ICD-10-CM | POA: Diagnosis not present

## 2020-10-29 DIAGNOSIS — C44612 Basal cell carcinoma of skin of right upper limb, including shoulder: Secondary | ICD-10-CM | POA: Diagnosis not present

## 2020-10-29 DIAGNOSIS — L821 Other seborrheic keratosis: Secondary | ICD-10-CM | POA: Diagnosis not present

## 2020-10-29 DIAGNOSIS — C44712 Basal cell carcinoma of skin of right lower limb, including hip: Secondary | ICD-10-CM | POA: Diagnosis not present

## 2020-12-04 ENCOUNTER — Ambulatory Visit: Payer: Self-pay

## 2020-12-04 ENCOUNTER — Ambulatory Visit (INDEPENDENT_AMBULATORY_CARE_PROVIDER_SITE_OTHER): Payer: Medicare PPO

## 2020-12-04 ENCOUNTER — Ambulatory Visit: Payer: Medicare PPO | Admitting: Family Medicine

## 2020-12-04 ENCOUNTER — Other Ambulatory Visit: Payer: Self-pay

## 2020-12-04 VITALS — BP 120/60 | HR 68 | Ht <= 58 in | Wt 116.0 lb

## 2020-12-04 DIAGNOSIS — M25561 Pain in right knee: Secondary | ICD-10-CM

## 2020-12-04 DIAGNOSIS — M7742 Metatarsalgia, left foot: Secondary | ICD-10-CM | POA: Diagnosis not present

## 2020-12-04 DIAGNOSIS — M7741 Metatarsalgia, right foot: Secondary | ICD-10-CM

## 2020-12-04 DIAGNOSIS — M1711 Unilateral primary osteoarthritis, right knee: Secondary | ICD-10-CM

## 2020-12-04 NOTE — Progress Notes (Signed)
[place] Phone: [phone] Subjective:   Fontaine No, am serving as a scribe for Dr. Hulan Saas.  This visit occurred during the SARS-CoV-2 public health emergency.  Safety protocols were in place, including screening questions prior to the visit, additional usage of staff PPE, and extensive cleaning of exam room while observing appropriate contact time as indicated for disinfecting solutions.   I'm seeing this patient by the request  of:  Koberlein, Steele Berg, MD  CC: Right knee pain, left foot pain  [id number]  Courtney [name] is a 75 y.o. female coming in with complaint of R knee and L foot pain. Patient states Right knee pain has been going on a month with popping and crackling when standing up, no MOI. Takes Advil when needed. Left foot pain has been painful about a year states her hammertoe and metatarsals patient has arch support and wears a sleeve sometimes, also will tape toes together.        Past Medical History:  Diagnosis Date   Breast cancer [facility]) July 2012   post double mastectomy; Dr Humphrey Rolls   Diverticulosis    Family history of breast cancer    GERD (gastroesophageal reflux disease)    S/P dilation X 1   Hemorrhoids    Hypertension    Night sweats    OSA (obstructive sleep apnea) 08/03/2017   Very mild with AHI 7.2/hr.     Prolapsed internal hemorrhoids, grade 3 06/27/2017   Grade 2 on anoscopy exam but grade 3 by history and seen a colonoscopy as well All 3 columns banded 06/27/2017  09/21/2017 banded RP and RA     Reactive airway disease    Past Surgical History:  Procedure Laterality Date   ABDOMINAL HYSTERECTOMY     bilateral breast reconstruction  Jan 2013   BREAST SURGERY  01/13/11   bilateral total mastectomy; Dr Dalbert Batman   COLONOSCOPY  2014   Dr Olevia Perches; hyperplastic polyp   ESOPHAGEAL DILATION  2014   HEMORRHOID BANDING  2019   February and May   NASAL SEPTUM SURGERY      RECONSTRUCTION / CORRECTION OF NIPPLE / Tresa Moore  June 2013   THYROGLOSSAL DUCT CYST     Social History   Socioeconomic History   Marital status: Married    Spouse name: Not on file   Number of children: Not on file   Years of education: Not on file   Highest education level: Not on file  Occupational History   Not on file  Tobacco Use   Smoking status: Former    Years: 5.00    Types: Cigarettes    Quit date: 05/03/1965    Years since quitting: 55.6   Smokeless tobacco: Never   Tobacco comments:    smoked 1962-1967, less than 1 ppd  Vaping Use   Vaping Use: Never used  Substance and Sexual Activity   Alcohol use: Yes    Alcohol/week: 14.0 standard drinks    Types: 14 Glasses of wine per week    Comment: 2-3 /day   Drug use: No   Sexual activity: Yes  Other Topics Concern   Not on file  Social History Narrative   Married   2-3 glasses wine/day   remote small vol smmoker   Social Determinants of Health   Financial Resource Strain: Low Risk    Difficulty of Paying Living Expenses: Not hard at all  Food Insecurity: No Food  Insecurity   Worried About Charity fundraiser in the Last Year: Never true   [place] in the Last Year: Never true  Transportation Needs: No Transportation Needs   Lack of Transportation (Medical): No   Lack of Transportation (Non-Medical): No  Physical Activity: Sufficiently Active   Days of Exercise per Week: 5 days   Minutes of Exercise per Session: 30 min  Stress: No Stress Concern Present   Feeling of Stress : Not at all  Social Connections: Socially Integrated   Frequency of Communication with Friends and Family: More than three times a week   Frequency of Social Gatherings with Friends and Family: More than three times a week   Attends Religious Services: More than 4 times per year   Active Member of [employer] or Organizations: Yes   Attends Music therapist: More than 4 times per year   Marital Status: Married   No Known  Allergies Family History  Problem Relation Age of Onset   Breast cancer Mother 38   Colon cancer Mother 40   Cervical cancer Mother 33       s/p hysterectomy   Heart attack Father 51   Diabetes Father        probable pre DM   Skin cancer Father        maybe melanoma   Cancer Sister 84       appendiceal   Heart attack Maternal Grandfather 57   Heart attack Paternal Uncle        X2 ; >50y   Heart attack Maternal Uncle        d. 46   Parkinson's disease Maternal Grandmother        d. 67   Heart attack Paternal Grandfather    Breast cancer Other        unspecified age; maternal great aunt (MGM's sister)   Breast cancer Cousin        maternal 1st cousin, once-removed; unspecified age   Heart attack Paternal Uncle        >50y   Stroke Neg Hx     Current Outpatient Medications (Endocrine & Metabolic):    predniSONE (DELTASONE) 20 MG tablet, Take 20 mg by mouth.   Current Outpatient Medications (Cardiovascular):    amLODipine (NORVASC) 5 MG tablet, TAKE 1 TABLET(5 MG) BY MOUTH DAILY   hydrochlorothiazide (MICROZIDE) 12.5 MG capsule, TAKE 1 CAPSULE(12.5 MG) BY MOUTH DAILY   losartan (COZAAR) 100 MG tablet, TAKE 1 TABLET(100 MG) BY MOUTH DAILY   Current Outpatient Medications (Respiratory):    albuterol (PROVENTIL) (2.5 MG/3ML) 0.083% nebulizer solution, Take 3 mLs (2.5 mg total) by nebulization every 6 (six) hours as needed for wheezing or shortness of breath.   budesonide-formoterol (SYMBICORT) 160-4.5 MCG/ACT inhaler, INHALE 1 TO 2 PUFFS EVERY 12 HOURS AS NEEDED. GARGLE AND SPIT AFTER USE   montelukast (SINGULAIR) 10 MG tablet, TAKE 1 TABLET(10 MG) BY MOUTH AT BEDTIME  Current Facility-Administered Medications (Respiratory):    albuterol (PROVENTIL) (2.5 MG/3ML) 0.083% nebulizer solution 2.5 mg  Current Outpatient Medications (Analgesics):    aspirin 81 MG tablet, Take 81 mg by mouth daily.     Current Outpatient Medications (Other):    calcium citrate-vitamin D  (CITRACAL+D) 315-200 MG-UNIT per tablet, Take 1 tablet by mouth daily.   Carboxymethylcellulose Sod PF (RETAINE CMC) 0.5 % SOLN, Apply to eye.   Cholecalciferol (VITAMIN D3) 1000 UNITS CAPS, Take by mouth daily.   co-enzyme Q-10 30 MG capsule, Take  50 mg by mouth daily.   FINACEA 15 % cream, Apply 1 application topically 2 (two) times daily. For rosacea   Misc Natural Products (TART CHERRY ADVANCED PO), Take 2,400 mg by mouth daily.   Multiple Vitamins-Minerals (MULTIVITAMIN WITH MINERALS) tablet, Take 1 tablet by mouth daily.   Multiple Vitamins-Minerals (PRESERVISION AREDS 2 PO), Take by mouth.   OMEGA 3 1000 MG CAPS, Take 1,000 mg by mouth daily.   omeprazole (PRILOSEC) 20 MG capsule, TAKE 1 CAPSULE BY MOUTH TWICE DAILY BEFORE A MEAL   psyllium (METAMUCIL) 58.6 % powder, Take 1 packet by mouth daily.    Reviewed prior external information including notes and imaging from  primary care provider As well as notes that were available from care everywhere and other healthcare systems.  Past medical history, social, surgical and family history all reviewed in electronic medical record.  No pertanent information unless stated regarding to the chief complaint.   Review of Systems:  No headache, visual changes, nausea, vomiting, diarrhea, constipation, dizziness, abdominal pain, skin rash, fevers, chills, night sweats, weight loss, swollen lymph nodes, body aches, joint swelling, chest pain, shortness of breath, mood changes. POSITIVE muscle aches  Objective  Blood pressure 120/60, pulse 68, height '4\' 9"'$  (1.448 m), weight 116 lb (52.6 kg), SpO2 98 %.   General: No apparent distress alert and oriented x3 mood and affect normal, dressed appropriately.  HEENT: Pupils equal, extraocular movements intact  Respiratory: Patient's speak in full sentences and does not appear short of breath  Cardiovascular: No lower extremity edema, non tender, no erythema  Gait very minorly antalgic Right knee exam  shows the patient does have some lateral tracking of the patella.  Mild crepitus noted.  No significant instability. Patient's left foot does have breakdown of the transverse arch noted.  Mild overpronation of the hindfoot.  Patient does have hammertoe in of the second and third toes noted.  Limited muscular skeletal ultrasound was performed and interpreted by Hulan Saas, M  Limited ultrasound of patient's right knee shows the patient does have a small effusion noted.  In addition it is moderate to severe narrowing of the patellofemoral joint.  Moderate narrowing of the medial joint space as well. Impression: Knee arthritis   Impression and Recommendations:     The above documentation has been reviewed and is accurate and complete Lyndal Pulley, DO

## 2020-12-04 NOTE — Patient Instructions (Signed)
Good to see you fellow leo Exercise given  [place] given then switch to voltaren Brace given [place] or oofos shoes  S[place] total support thin shoe inserts  Do not lace last eye on shoe See me again in 6-8 weeks

## 2020-12-05 DIAGNOSIS — M7740 Metatarsalgia, unspecified foot: Secondary | ICD-10-CM | POA: Insufficient documentation

## 2020-12-05 DIAGNOSIS — M1711 Unilateral primary osteoarthritis, right knee: Secondary | ICD-10-CM | POA: Insufficient documentation

## 2020-12-05 NOTE — Assessment & Plan Note (Signed)
Degenerative right knee with mostly a lateral translation of the patella noted with small effusion.  Patient given a Tru pull lite brace.  And home exercises.  Encouraged topical anti-inflammatories and icing regimen.  Worsening pain consider formal physical therapy and injection.  Follow-up again in 6 weeks

## 2020-12-05 NOTE — Assessment & Plan Note (Signed)
Patient does have breakdown of the transverse arch bilaterally.  Likely contributing to some of the discomfort and pain.  Increase activity slowly.  Discussed icing regimen.  Follow-up again in 6 to 8 weeks

## 2021-01-06 DIAGNOSIS — H2513 Age-related nuclear cataract, bilateral: Secondary | ICD-10-CM | POA: Diagnosis not present

## 2021-01-06 DIAGNOSIS — H353131 Nonexudative age-related macular degeneration, bilateral, early dry stage: Secondary | ICD-10-CM | POA: Diagnosis not present

## 2021-01-06 DIAGNOSIS — H02834 Dermatochalasis of left upper eyelid: Secondary | ICD-10-CM | POA: Diagnosis not present

## 2021-01-06 DIAGNOSIS — H40053 Ocular hypertension, bilateral: Secondary | ICD-10-CM | POA: Diagnosis not present

## 2021-01-14 NOTE — Progress Notes (Signed)
[place] Phone: [phone] Subjective:   Fontaine No, am serving as a scribe for Dr. Hulan Saas.  This visit occurred during the SARS-CoV-2 public health emergency.  Safety protocols were in place, including screening questions prior to the visit, additional usage of staff PPE, and extensive cleaning of exam room while observing appropriate contact time as indicated for disinfecting solutions.    I'm seeing this patient by the request  of:  Caren Macadam, MD  CC: Right knee pain follow-up  [id number]  12/04/2020 Patient does have breakdown of the transverse arch bilaterally.  Likely contributing to some of the discomfort and pain.  Increase activity slowly.  Discussed icing regimen.  Follow-up again in 6 to 8 weeks  Degenerative right knee with mostly a lateral translation of the patella noted with small effusion.  Patient given a Tru pull lite brace.  And home exercises.  Encouraged topical anti-inflammatories and icing regimen.  Worsening pain consider formal physical therapy and injection.  Follow-up again in 6 weeks  Update 01/15/2021 ELSBETH FRIEDERICHS is a 75 y.o. female coming in with complaint of R knee and B foot pain. Patient states that she has been using Voltaren gel on the R knee. Patient has been using sleeve on hammer toe which has relieved some pressure. Overall feels improvement.  Patient states 75% improvement overall.      Past Medical History:  Diagnosis Date   Breast cancer [facility]) July 2012   post double mastectomy; Dr Humphrey Rolls   Diverticulosis    Family history of breast cancer    GERD (gastroesophageal reflux disease)    S/P dilation X 1   Hemorrhoids    Hypertension    Night sweats    OSA (obstructive sleep apnea) 08/03/2017   Very mild with AHI 7.2/hr.     Prolapsed internal hemorrhoids, grade 3 06/27/2017   Grade 2 on anoscopy exam but grade 3 by history and seen a colonoscopy  as well All 3 columns banded 06/27/2017  09/21/2017 banded RP and RA     Reactive airway disease    Past Surgical History:  Procedure Laterality Date   ABDOMINAL HYSTERECTOMY     bilateral breast reconstruction  Jan 2013   BREAST SURGERY  01/13/11   bilateral total mastectomy; Dr Dalbert Batman   COLONOSCOPY  2014   Dr Olevia Perches; hyperplastic polyp   ESOPHAGEAL DILATION  2014   HEMORRHOID BANDING  2019   February and May   NASAL SEPTUM SURGERY     RECONSTRUCTION / CORRECTION OF NIPPLE / Tresa Moore  June 2013   THYROGLOSSAL DUCT CYST     Social History   Socioeconomic History   Marital status: Married    Spouse name: Not on file   Number of children: Not on file   Years of education: Not on file   Highest education level: Not on file  Occupational History   Not on file  Tobacco Use   Smoking status: Former    Years: 5.00    Types: Cigarettes    Quit date: 05/03/1965    Years since quitting: 55.7   Smokeless tobacco: Never   Tobacco comments:    smoked 1962-1967, less than 1 ppd  Vaping Use   Vaping Use: Never used  Substance and Sexual Activity   Alcohol use: Yes    Alcohol/week: 14.0 standard drinks    Types: 14 Glasses of wine per week    Comment:  2-3 /day   Drug use: No   Sexual activity: Yes  Other Topics Concern   Not on file  Social History Narrative   Married   2-3 glasses wine/day   remote small vol smmoker   Social Determinants of Health   Financial Resource Strain: Low Risk    Difficulty of Paying Living Expenses: Not hard at all  Food Insecurity: No Food Insecurity   Worried About Charity fundraiser in the Last Year: Never true   Arboriculturist in the Last Year: Never true  Transportation Needs: No Transportation Needs   Lack of Transportation (Medical): No   Lack of Transportation (Non-Medical): No  Physical Activity: Sufficiently Active   Days of Exercise per Week: 5 days   Minutes of Exercise per Session: 30 min  Stress: No Stress Concern Present    Feeling of Stress : Not at all  Social Connections: Socially Integrated   Frequency of Communication with Friends and Family: More than three times a week   Frequency of Social Gatherings with Friends and Family: More than three times a week   Attends Religious Services: More than 4 times per year   Active Member of [employer] or Organizations: Yes   Attends Music therapist: More than 4 times per year   Marital Status: Married   No Known Allergies Family History  Problem Relation Age of Onset   Breast cancer Mother 31   Colon cancer Mother 22   Cervical cancer Mother 61       s/p hysterectomy   Heart attack Father 48   Diabetes Father        probable pre DM   Skin cancer Father        maybe melanoma   Cancer Sister 27       appendiceal   Heart attack Maternal Grandfather 57   Heart attack Paternal Uncle        X2 ; >50y   Heart attack Maternal Uncle        d. 63   Parkinson's disease Maternal Grandmother        d. 5   Heart attack Paternal Grandfather    Breast cancer Other        unspecified age; maternal great aunt (MGM's sister)   Breast cancer Cousin        maternal 1st cousin, once-removed; unspecified age   Heart attack Paternal Uncle        >50y   Stroke Neg Hx     Current Outpatient Medications (Endocrine & Metabolic):    predniSONE (DELTASONE) 20 MG tablet, Take 20 mg by mouth.   Current Outpatient Medications (Cardiovascular):    amLODipine (NORVASC) 5 MG tablet, TAKE 1 TABLET(5 MG) BY MOUTH DAILY   hydrochlorothiazide (MICROZIDE) 12.5 MG capsule, TAKE 1 CAPSULE(12.5 MG) BY MOUTH DAILY   losartan (COZAAR) 100 MG tablet, TAKE 1 TABLET(100 MG) BY MOUTH DAILY   Current Outpatient Medications (Respiratory):    albuterol (PROVENTIL) (2.5 MG/3ML) 0.083% nebulizer solution, Take 3 mLs (2.5 mg total) by nebulization every 6 (six) hours as needed for wheezing or shortness of breath.   budesonide-formoterol (SYMBICORT) 160-4.5 MCG/ACT inhaler, INHALE 1 TO  2 PUFFS EVERY 12 HOURS AS NEEDED. GARGLE AND SPIT AFTER USE   montelukast (SINGULAIR) 10 MG tablet, TAKE 1 TABLET(10 MG) BY MOUTH AT BEDTIME  Current Facility-Administered Medications (Respiratory):    albuterol (PROVENTIL) (2.5 MG/3ML) 0.083% nebulizer solution 2.5 mg  Current Outpatient Medications (Analgesics):  aspirin 81 MG tablet, Take 81 mg by mouth daily.     Current Outpatient Medications (Other):    calcium citrate-vitamin D (CITRACAL+D) 315-200 MG-UNIT per tablet, Take 1 tablet by mouth daily.   Carboxymethylcellulose Sod PF (RETAINE CMC) 0.5 % SOLN, Apply to eye.   Cholecalciferol (VITAMIN D3) 1000 UNITS CAPS, Take by mouth daily.   co-enzyme Q-10 30 MG capsule, Take 50 mg by mouth daily.   FINACEA 15 % cream, Apply 1 application topically 2 (two) times daily. For rosacea   Misc Natural Products (TART CHERRY ADVANCED PO), Take 2,400 mg by mouth daily.   Multiple Vitamins-Minerals (MULTIVITAMIN WITH MINERALS) tablet, Take 1 tablet by mouth daily.   Multiple Vitamins-Minerals (PRESERVISION AREDS 2 PO), Take by mouth.   OMEGA 3 1000 MG CAPS, Take 1,000 mg by mouth daily.   omeprazole (PRILOSEC) 20 MG capsule, TAKE 1 CAPSULE BY MOUTH TWICE DAILY BEFORE A MEAL   psyllium (METAMUCIL) 58.6 % powder, Take 1 packet by mouth daily.    Reviewed prior external information including notes and imaging from  primary care provider As well as notes that were available from care everywhere and other healthcare systems.  Past medical history, social, surgical and family history all reviewed in electronic medical record.  No pertanent information unless stated regarding to the chief complaint.   Review of Systems:  No headache, visual changes, nausea, vomiting, diarrhea, constipation, dizziness, abdominal pain, skin rash, fevers, chills, night sweats, weight loss, swollen lymph nodes, body aches, joint swelling, chest pain, shortness of breath, mood changes. POSITIVE muscle  aches  Objective  Blood pressure 122/72, pulse 75, height '4\' 9"'$  (1.448 m), weight 115 lb (52.2 kg), SpO2 98 %.   General: No apparent distress alert and oriented x3 mood and affect normal, dressed appropriately.  HEENT: Pupils equal, extraocular movements intact  Respiratory: Patient's speak in full sentences and does not appear short of breath  Cardiovascular: No lower extremity edema, non tender, no erythema  Gait normal with good balance and coordination.  MSK: Patient's right knee still has some mild tenderness over the medial joint line.  Trace effusion noted of the patellofemoral joint and lateral tracking of the patella noted. No significant instability noted with valgus or varus force.   Impression and Recommendations:     The above documentation has been reviewed and is accurate and complete Lyndal Pulley, DO

## 2021-01-15 ENCOUNTER — Ambulatory Visit: Payer: Medicare PPO | Admitting: Family Medicine

## 2021-01-15 ENCOUNTER — Other Ambulatory Visit: Payer: Self-pay

## 2021-01-15 DIAGNOSIS — M1711 Unilateral primary osteoarthritis, right knee: Secondary | ICD-10-CM

## 2021-01-15 NOTE — Patient Instructions (Signed)
Consider icing or graston stool after activity Continue exercises as cool down Ok to increase activity w/o brace See me in 2-3 months

## 2021-01-15 NOTE — Assessment & Plan Note (Signed)
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  Patient still has some effusion noted of the knee.  Patient notes feeling better and feels like the brace does not make as much significant improvement.  We encourage patient to try more of the icing regimen which she has not been doing.  Patient wants to hold on any injection or potential formal physical therapy at this point but we will bring those up again at follow-up if necessary in 2 to 3 months.

## 2021-02-04 ENCOUNTER — Inpatient Hospital Stay: Payer: Medicare PPO | Attending: Adult Health | Admitting: Adult Health

## 2021-02-04 ENCOUNTER — Ambulatory Visit: Payer: Medicare PPO

## 2021-02-04 ENCOUNTER — Other Ambulatory Visit: Payer: Self-pay

## 2021-02-04 VITALS — BP 137/69 | HR 67 | Temp 97.3°F | Resp 18 | Ht <= 58 in | Wt 114.9 lb

## 2021-02-04 DIAGNOSIS — Z853 Personal history of malignant neoplasm of breast: Secondary | ICD-10-CM | POA: Diagnosis not present

## 2021-02-04 DIAGNOSIS — C50911 Malignant neoplasm of unspecified site of right female breast: Secondary | ICD-10-CM | POA: Diagnosis not present

## 2021-02-04 DIAGNOSIS — Z170 Estrogen receptor positive status [ER+]: Secondary | ICD-10-CM | POA: Diagnosis not present

## 2021-02-04 DIAGNOSIS — C50912 Malignant neoplasm of unspecified site of left female breast: Secondary | ICD-10-CM | POA: Diagnosis not present

## 2021-02-04 DIAGNOSIS — Z23 Encounter for immunization: Secondary | ICD-10-CM

## 2021-02-04 DIAGNOSIS — M8580 Other specified disorders of bone density and structure, unspecified site: Secondary | ICD-10-CM | POA: Diagnosis not present

## 2021-02-04 MED ORDER — INFLUENZA VAC A&B SA ADJ QUAD 0.5 ML IM PRSY: 0.5000 mL | PREFILLED_SYRINGE | Freq: Once | INTRAMUSCULAR | Status: DC

## 2021-02-04 MED ORDER — INFLUENZA VAC SPLIT QUAD 0.5 ML IM SUSY: 0.5000 mL | PREFILLED_SYRINGE | Freq: Once | INTRAMUSCULAR | Status: DC

## 2021-02-04 NOTE — Progress Notes (Signed)
CLINIC:  Survivorship   REASON FOR VISIT:  Routine follow-up for history of breast cancer.   BRIEF ONCOLOGIC HISTORY:  (1) status post bilateral mastectomies with bilateral sentinel lymph node sampling 01/13/2011, showing             (a) on the left, 2 foci of invasive ductal carcinoma, the larger measuring 1.2 cm, grade 2, both tumors being strongly estrogen and progesterone receptor positive, with MIB-once of 13-14%, and HER-2/neu negative. Her (pT1c pN0 = stage IA)             (b) on the right, apT1c pN0, stage IA invasive ductal carcinoma, grade 2, estrogen receptor 100% positive, progesterone receptor 9% positive, with an MIB-1 of 12% and no HER-2 amplification   (2) Oncotype DX on the largest tumor showed a score of 10, suggesting a risk of recurrence outside of the breast within the next 10 years would be 7% if the patient only systemic therapy was tamoxifen for 5 years. It also predicts no benefit from chemotherapy   (3) letrozole from October of 2012 through 02/2016   (4) Osteopenia, with T score of -1.4 on bone density at [facility] 11/05/2013.   INTERVAL HISTORY:  Ms. Casella presents to the Survivorship Clinic today for routine follow-up for her history of breast cancer.    Overall, Darel Hong says she continues to do quite well.  She has no new health concerns.  She is up to date with her PCP visits and health maintenance.    REVIEW OF SYSTEMS:  Review of Systems  Constitutional:  Negative for appetite change, chills, fatigue, fever and unexpected weight change.  HENT:   Negative for hearing loss, lump/mass and trouble swallowing.   Eyes:  Negative for eye problems and icterus.  Respiratory:  Negative for chest tightness, cough and shortness of breath.   Cardiovascular:  Negative for chest pain, leg swelling and palpitations.  Gastrointestinal:  Negative for abdominal distention, abdominal pain, constipation, diarrhea, nausea and vomiting.  Endocrine: Negative for hot flashes.   Musculoskeletal:  Negative for arthralgias.  Skin:  Negative for itching and rash.  Neurological:  Negative for dizziness, extremity weakness, headaches and numbness.  Hematological:  Negative for adenopathy. Does not bruise/bleed easily.  Psychiatric/Behavioral:  Negative for depression. The patient is not nervous/anxious.   Breast: Denies any new nodularity, masses, tenderness, nipple changes, or nipple discharge.       PAST MEDICAL/SURGICAL HISTORY:  Past Medical History:  Diagnosis Date   Breast cancer [facility]) July 2012   post double mastectomy; Dr Welton Flakes   Diverticulosis    Family history of breast cancer    GERD (gastroesophageal reflux disease)    S/P dilation X 1   Hemorrhoids    Hypertension    Night sweats    OSA (obstructive sleep apnea) 08/03/2017   Very mild with AHI 7.2/hr.     Prolapsed internal hemorrhoids, grade 3 06/27/2017   Grade 2 on anoscopy exam but grade 3 by history and seen a colonoscopy as well All 3 columns banded 06/27/2017  09/21/2017 banded RP and RA     Reactive airway disease    Past Surgical History:  Procedure Laterality Date   ABDOMINAL HYSTERECTOMY     bilateral breast reconstruction  Jan 2013   BREAST SURGERY  01/13/11   bilateral total mastectomy; Dr Derrell Lolling   COLONOSCOPY  2014   Dr Juanda Chance; hyperplastic polyp   ESOPHAGEAL DILATION  2014   HEMORRHOID BANDING  2019   February and  May   NASAL SEPTUM SURGERY     RECONSTRUCTION / CORRECTION OF NIPPLE / AEROLA  June 2013   THYROGLOSSAL DUCT CYST       ALLERGIES:  No Known Allergies   CURRENT MEDICATIONS:  Outpatient Encounter Medications as of 02/04/2021  Medication Sig   albuterol (PROVENTIL) (2.5 MG/3ML) 0.083% nebulizer solution Take 3 mLs (2.5 mg total) by nebulization every 6 (six) hours as needed for wheezing or shortness of breath.   amLODipine (NORVASC) 5 MG tablet TAKE 1 TABLET(5 MG) BY MOUTH DAILY   aspirin 81 MG tablet Take 81 mg by mouth daily.   budesonide-formoterol  (SYMBICORT) 160-4.5 MCG/ACT inhaler INHALE 1 TO 2 PUFFS EVERY 12 HOURS AS NEEDED. GARGLE AND SPIT AFTER USE   calcium citrate-vitamin D (CITRACAL+D) 315-200 MG-UNIT per tablet Take 1 tablet by mouth daily.   Carboxymethylcellulose Sod PF (RETAINE CMC) 0.5 % SOLN Apply to eye.   Cholecalciferol (VITAMIN D3) 1000 UNITS CAPS Take by mouth daily.   co-enzyme Q-10 30 MG capsule Take 50 mg by mouth daily.   FINACEA 15 % cream Apply 1 application topically 2 (two) times daily. For rosacea   hydrochlorothiazide (MICROZIDE) 12.5 MG capsule TAKE 1 CAPSULE(12.5 MG) BY MOUTH DAILY   losartan (COZAAR) 100 MG tablet TAKE 1 TABLET(100 MG) BY MOUTH DAILY   Misc Natural Products (TART CHERRY ADVANCED PO) Take 2,400 mg by mouth daily.   montelukast (SINGULAIR) 10 MG tablet TAKE 1 TABLET(10 MG) BY MOUTH AT BEDTIME   Multiple Vitamins-Minerals (MULTIVITAMIN WITH MINERALS) tablet Take 1 tablet by mouth daily.   Multiple Vitamins-Minerals (PRESERVISION AREDS 2 PO) Take by mouth.   OMEGA 3 1000 MG CAPS Take 1,000 mg by mouth daily.   omeprazole (PRILOSEC) 20 MG capsule TAKE 1 CAPSULE BY MOUTH TWICE DAILY BEFORE A MEAL   predniSONE (DELTASONE) 20 MG tablet Take 20 mg by mouth.   psyllium (METAMUCIL) 58.6 % powder Take 1 packet by mouth daily.   Facility-Administered Encounter Medications as of 02/04/2021  Medication   albuterol (PROVENTIL) (2.5 MG/3ML) 0.083% nebulizer solution 2.5 mg     ONCOLOGIC FAMILY HISTORY:  Family History  Problem Relation Age of Onset   Breast cancer Mother 43   Colon cancer Mother 46   Cervical cancer Mother 62       s/p hysterectomy   Heart attack Father 62   Diabetes Father        probable pre DM   Skin cancer Father        maybe melanoma   Cancer Sister 46       appendiceal   Heart attack Maternal Grandfather 50   Heart attack Paternal Uncle        X58 ; >50y   Heart attack Maternal Uncle        d. 66   Parkinson's disease Maternal Grandmother        d. 52   Heart  attack Paternal Grandfather    Breast cancer Other        unspecified age; maternal great aunt (MGM's sister)   Breast cancer Cousin        maternal 1st cousin, once-removed; unspecified age   Heart attack Paternal Uncle        >50y   Stroke Neg Hx      SOCIAL HISTORY:  Social History   Socioeconomic History   Marital status: Married    Spouse name: Not on file   Number of children: Not on file   Years  of education: Not on file   Highest education level: Not on file  Occupational History   Not on file  Tobacco Use   Smoking status: Former    Years: 5.00    Types: Cigarettes    Quit date: 05/03/1965    Years since quitting: 55.7   Smokeless tobacco: Never   Tobacco comments:    smoked 1962-1967, less than 1 ppd  Vaping Use   Vaping Use: Never used  Substance and Sexual Activity   Alcohol use: Yes    Alcohol/week: 14.0 standard drinks    Types: 14 Glasses of wine per week    Comment: 2-3 /day   Drug use: No   Sexual activity: Yes  Other Topics Concern   Not on file  Social History Narrative   Married   2-3 glasses wine/day   remote small vol smmoker   Social Determinants of Health   Financial Resource Strain: Low Risk    Difficulty of Paying Living Expenses: Not hard at all  Food Insecurity: No Food Insecurity   Worried About Charity fundraiser in the Last Year: Never true   Arboriculturist in the Last Year: Never true  Transportation Needs: No Transportation Needs   Lack of Transportation (Medical): No   Lack of Transportation (Non-Medical): No  Physical Activity: Sufficiently Active   Days of Exercise per Week: 5 days   Minutes of Exercise per Session: 30 min  Stress: No Stress Concern Present   Feeling of Stress : Not at all  Social Connections: Socially Integrated   Frequency of Communication with Friends and Family: More than three times a week   Frequency of Social Gatherings with Friends and Family: More than three times a week   Attends Religious  Services: More than 4 times per year   Active Member of [employer] or Organizations: Yes   Attends Music therapist: More than 4 times per year   Marital Status: Married  Human resources officer Violence: Not At Risk   Fear of Current or Ex-Partner: No   Emotionally Abused: No   Physically Abused: No   Sexually Abused: No      PHYSICAL EXAMINATION:  Vital Signs: Vitals:   02/04/21 1117  BP: 137/69  Pulse: 67  Resp: 18  Temp: (!) 97.3 F (36.3 C)  SpO2: 100%   Filed Weights   02/04/21 1117  Weight: 114 lb 14.4 oz (52.1 kg)   General: Well-nourished, well-appearing female in no acute distress.  Unaccompanied today.   HEENT: Head is normocephalic.  Pupils equal and reactive to light. Conjunctivae clear without exudate.  Sclerae anicteric. Oral mucosa is pink, moist.  Oropharynx is pink without lesions or erythema.  Lymph: No cervical, supraclavicular, or infraclavicular lymphadenopathy noted on palpation.  Cardiovascular: Regular rate and rhythm.Marland Kitchen Respiratory: Clear to auscultation bilaterally. Chest expansion symmetric; breathing non-labored.  Breast Exam:  -s/p bilateral mastectomies with implant placement, no nodules or masses noted bilaterally -Axilla: No axillary adenopathy bilaterally.  GI: Abdomen soft and round; non-tender, non-distended. Bowel sounds normoactive. No hepatosplenomegaly.   GU: Deferred.  Neuro: No focal deficits. Steady gait.  Psych: Mood and affect normal and appropriate for situation.  MSK: No focal spinal tenderness to palpation, full range of motion in bilateral upper extremities Extremities: No edema. Skin: Warm and dry.  LABORATORY DATA:  None for this visit   DIAGNOSTIC IMAGING:  Most recent mammogram:  N/a s/p bilateral mastectomies    ASSESSMENT AND PLAN:  Ms.. Birkey  is a pleasant 75 y.o. 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  She presents to the Survivorship Clinic for surveillance and routine follow-up.   1. History of breast cancer:  Ms. Chea is currently clinically and radiographically without evidence of disease or recurrence of breast cancer. She and I researched a possible recall on her implants.  I placed a referral to Dr. Iran Planas for her to see her and f/u with her about this recall.  I also recommended Bethena Roys call the implant company [employer] to find out if her implants were included in the recall.  We will see her back in a year for LTS follow up.  I encouraged her to call me with any questions or concerns before her next visit at the cancer center, and I would be happy to see her sooner, if needed.    2. Bone health:  She was given education on specific food and activities to promote bone health.  3. Cancer screening:  Due to Ms. Vergara's history and her age, she should receive screening for skin cancers, colon cancer, and gynecologic cancers. She was encouraged to follow-up with her PCP for appropriate cancer screenings.   4. Health maintenance and wellness promotion: Ms. Gibb was encouraged to consume 5-7 servings of fruits and vegetables per day. She was also encouraged to engage in moderate to vigorous exercise for 30 minutes per day most days of the week. She was instructed to limit her alcohol consumption and continue to abstain from tobacco use.     Dispo:  -Return to cancer center in one year for LTS follow up  Total encounter time: 20 minutes in face to face visit time, chart review, lab review, order entry, care coordination and documentation of the encounter.    Wilber Bihari, NP 02/04/21 11:25 AM Medical Oncology and Hematology [facility] [place], [place] [address] Tel. [phone]    Fax. [phone]  *Total Encounter Time as defined by the Centers for Medicare and Medicaid Services includes, in  addition to the face-to-face time of a patient visit (documented in the note above) non-face-to-face time: obtaining and reviewing outside history, ordering and reviewing medications, tests or procedures, care coordination (communications with other health care professionals or caregivers) and documentation in the medical record.   Note: PRIMARY CARE PROVIDER Caren Macadam, [place] [phone]

## 2021-02-05 ENCOUNTER — Encounter: Payer: Self-pay | Admitting: Adult Health

## 2021-02-22 ENCOUNTER — Other Ambulatory Visit: Payer: Self-pay | Admitting: Family Medicine

## 2021-02-23 ENCOUNTER — Ambulatory Visit (INDEPENDENT_AMBULATORY_CARE_PROVIDER_SITE_OTHER): Payer: Medicare PPO

## 2021-02-23 ENCOUNTER — Other Ambulatory Visit: Payer: Self-pay

## 2021-02-23 DIAGNOSIS — Z23 Encounter for immunization: Secondary | ICD-10-CM

## 2021-03-16 ENCOUNTER — Ambulatory Visit (INDEPENDENT_AMBULATORY_CARE_PROVIDER_SITE_OTHER): Payer: Medicare PPO | Admitting: Family Medicine

## 2021-03-16 ENCOUNTER — Encounter: Payer: Self-pay | Admitting: Family Medicine

## 2021-03-16 VITALS — BP 120/60 | HR 88 | Temp 98.2°F | Ht <= 58 in | Wt 115.1 lb

## 2021-03-16 DIAGNOSIS — R0602 Shortness of breath: Secondary | ICD-10-CM

## 2021-03-16 DIAGNOSIS — K219 Gastro-esophageal reflux disease without esophagitis: Secondary | ICD-10-CM

## 2021-03-16 DIAGNOSIS — R7301 Impaired fasting glucose: Secondary | ICD-10-CM | POA: Diagnosis not present

## 2021-03-16 DIAGNOSIS — I10 Essential (primary) hypertension: Secondary | ICD-10-CM

## 2021-03-16 DIAGNOSIS — Z23 Encounter for immunization: Secondary | ICD-10-CM

## 2021-03-16 DIAGNOSIS — Z0000 Encounter for general adult medical examination without abnormal findings: Secondary | ICD-10-CM

## 2021-03-16 DIAGNOSIS — G4733 Obstructive sleep apnea (adult) (pediatric): Secondary | ICD-10-CM

## 2021-03-16 DIAGNOSIS — M8580 Other specified disorders of bone density and structure, unspecified site: Secondary | ICD-10-CM | POA: Diagnosis not present

## 2021-03-16 DIAGNOSIS — E785 Hyperlipidemia, unspecified: Secondary | ICD-10-CM

## 2021-03-16 DIAGNOSIS — J4520 Mild intermittent asthma, uncomplicated: Secondary | ICD-10-CM

## 2021-03-16 LAB — COMPREHENSIVE METABOLIC PANEL
ALT: 42 U/L — ABNORMAL HIGH (ref 0–35)
AST: 31 U/L (ref 0–37)
Albumin: 4.8 g/dL (ref 3.5–5.2)
Alkaline Phosphatase: 43 U/L (ref 39–117)
BUN: 15 mg/dL (ref 6–23)
CO2: 29 mEq/L (ref 19–32)
Calcium: 10 mg/dL (ref 8.4–10.5)
Chloride: 104 mEq/L (ref 96–112)
Creatinine, Ser: 0.75 mg/dL (ref 0.40–1.20)
GFR: 76.88 mL/min (ref >60.00)
Glucose, Bld: 97 mg/dL (ref 70–99)
Potassium: 4.2 mEq/L (ref 3.5–5.1)
Sodium: 143 mEq/L (ref 135–145)
Total Bilirubin: 0.9 mg/dL (ref 0.2–1.2)
Total Protein: 7.7 g/dL (ref 6.0–8.3)

## 2021-03-16 LAB — CBC WITH DIFFERENTIAL/PLATELET
Basophils Absolute: 0.1 10*3/uL (ref 0.0–0.1)
Basophils Relative: 1.8 % (ref 0.0–3.0)
Eosinophils Absolute: 0.1 10*3/uL (ref 0.0–0.7)
Eosinophils Relative: 3.3 % (ref 0.0–5.0)
HCT: 42.7 % (ref 36.0–46.0)
Hemoglobin: 14.1 g/dL (ref 12.0–15.0)
Lymphocytes Relative: 37.3 % (ref 12.0–46.0)
Lymphs Abs: 1.6 10*3/uL (ref 0.7–4.0)
MCHC: 32.9 g/dL (ref 30.0–36.0)
MCV: 93 fl (ref 78.0–100.0)
Monocytes Absolute: 0.4 10*3/uL (ref 0.1–1.0)
Monocytes Relative: 9.2 % (ref 3.0–12.0)
Neutro Abs: 2.1 10*3/uL (ref 1.4–7.7)
Neutrophils Relative %: 48.4 % (ref 43.0–77.0)
Platelets: 207 10*3/uL (ref 150.0–400.0)
RBC: 4.59 Mil/uL (ref 3.87–5.11)
RDW: 13.4 % (ref 11.5–15.5)
WBC: 4.3 10*3/uL (ref 4.0–10.5)

## 2021-03-16 LAB — LIPID PANEL
Cholesterol: 256 mg/dL — ABNORMAL HIGH (ref 0–200)
HDL: 72.6 mg/dL (ref >39.00)
LDL Cholesterol: 168 mg/dL — ABNORMAL HIGH (ref 0–99)
NonHDL: 183.06
Total CHOL/HDL Ratio: 4
Triglycerides: 74 mg/dL (ref 0.0–149.0)
VLDL: 14.8 mg/dL (ref 0.0–40.0)

## 2021-03-16 LAB — HEMOGLOBIN A1C: Hgb A1c MFr Bld: 6.2 % (ref 4.6–6.5)

## 2021-03-16 MED ORDER — ALBUTEROL SULFATE HFA 108 (90 BASE) MCG/ACT IN AERS: 2.0000 | INHALATION_SPRAY | Freq: Four times a day (QID) | RESPIRATORY_TRACT | 0 refills | Status: DC | PRN

## 2021-03-16 NOTE — Progress Notes (Signed)
Courtney [name] DOB: 02/03/1946 Encounter date: 03/16/2021  This is a 75 y.o. female who presents for complete physical   History of present illness/Additional concerns:  Last visit with me was 09/10/2020.  Wanted me to look at her thumbs, ankles. Thumb isn't flat on left; right one aches. Left doesn't hurt. Wrists don't bother her. No numbness, tingling. Just aching right thumb - uses voltaren cream, which helps.   Noting that ankles are red, itching. Both ankles. All around ankles. Going on a couple of months. Bothers more sometimes than others. No swelling. Sometimes when sleeping notes more - more itching.   Hypertension: Amlodipine 5 mg, hydrochlorothiazide 12.5 mg, losartan 100 mg Asthma: Symbicort 160 mcg 1 to 2 puffs every 12 hours as needed, albuterol nebulizer as needed, Singulair 10 mg daily. Gets winded going uphill. Sometimes harder with going up and down stairs at house. Not sure if it helps with her huffing and puffing. Uses it more if she has been outside and allergies bothering her. No chest pain, pressure. No light headed/dizzy. No headaches. Has been that way 6 months+. May have started after covid, not really sure. At one point was taking the symbicort regularly twice daily. Been a few years. Hasn't used nebulizer for a few years at least.    Osteopenia: Vitamin D, weightbearing exercise.  Due for repeat DEXA. Arobics tues, thurs. Walking nearly every day. Walks 1.5 miles. Going uphill makes her huff and puff. Not stopping. She is able to keep walking and breathing does normalize. Weather doesn't seem to make a difference with breathing.   GERD: Omeprazole 20 mg daily. Usually once daily; sometimes has some issues after fatty foods - and will take BID.   Sleep apnea: Uses oral appliance.  Hyperglycemia: Diet controlled  Personal history of breast cancer: Follows with survivorship clinic. No more mammograms.   Follows regularly with dermatology, Dr. Ubaldo Glassing. She is retiring -  will have to re-establish.   Last colonoscopy 2018, repeat 04/2022.  Pressure in eyes was up a little at last eye check. Not started on any drops for eyes - will recheck in 6 months.   Coughing with eating, not difficult to swallow - feels that throat gets irritated. Some coughing during day as well. Sometimes will wake up coughing at night; but not usually. Noting in last 6 months. Sometimes spicy foods will trigger; but other times seems that rough foods (like cracker) will trigger. Usually can calm it down pretty quickly. Does not happen every time she eats. Taking second reflux pill several times/month.   Does have regular drainage - more when she wakes in morning - sometimes if coughing it up will get a little mucous that comes up.   No changes in voice. No coughing with walking.   Past Medical History:  Diagnosis Date   Breast cancer [facility]) July 2012   post double mastectomy; Dr Humphrey Rolls   Diverticulosis    Family history of breast cancer    GERD (gastroesophageal reflux disease)    S/P dilation X 1   Hemorrhoids    Hypertension    Night sweats    OSA (obstructive sleep apnea) 08/03/2017   Very mild with AHI 7.2/hr.     Prolapsed internal hemorrhoids, grade 3 06/27/2017   Grade 2 on anoscopy exam but grade 3 by history and seen a colonoscopy as well All 3 columns banded 06/27/2017  09/21/2017 banded RP and RA     Reactive airway disease    Past Surgical  History:  Procedure Laterality Date   ABDOMINAL HYSTERECTOMY     bilateral breast reconstruction  Jan 2013   BREAST SURGERY  01/13/11   bilateral total mastectomy; Dr Dalbert Batman   COLONOSCOPY  2014   Dr Olevia Perches; hyperplastic polyp   ESOPHAGEAL DILATION  2014   HEMORRHOID BANDING  2019   February and May   NASAL SEPTUM SURGERY     RECONSTRUCTION / CORRECTION OF NIPPLE / Tresa Moore  June 2013   THYROGLOSSAL DUCT CYST     No Known Allergies Current Meds  Medication Sig   albuterol (PROVENTIL) (2.5 MG/3ML) 0.083% nebulizer solution Take 3  mLs (2.5 mg total) by nebulization every 6 (six) hours as needed for wheezing or shortness of breath.   albuterol (VENTOLIN HFA) 108 (90 Base) MCG/ACT inhaler Inhale 2 puffs into the lungs every 6 (six) hours as needed for wheezing or shortness of breath.   amLODipine (NORVASC) 5 MG tablet TAKE 1 TABLET(5 MG) BY MOUTH DAILY   aspirin 81 MG tablet Take 81 mg by mouth daily.   budesonide-formoterol (SYMBICORT) 160-4.5 MCG/ACT inhaler INHALE 1 TO 2 PUFFS EVERY 12 HOURS AS NEEDED. GARGLE AND SPIT AFTER USE   calcium citrate-vitamin D (CITRACAL+D) 315-200 MG-UNIT per tablet Take 1 tablet by mouth daily.   Carboxymethylcellulose Sod PF (RETAINE CMC) 0.5 % SOLN Apply to eye.   Cholecalciferol (VITAMIN D3) 1000 UNITS CAPS Take by mouth daily.   co-enzyme Q-10 30 MG capsule Take 50 mg by mouth daily.   FINACEA 15 % cream Apply 1 application topically 2 (two) times daily. For rosacea   hydrochlorothiazide (MICROZIDE) 12.5 MG capsule TAKE 1 CAPSULE(12.5 MG) BY MOUTH DAILY   losartan (COZAAR) 100 MG tablet TAKE 1 TABLET(100 MG) BY MOUTH DAILY   Misc Natural Products (TART CHERRY ADVANCED PO) Take 2,400 mg by mouth daily.   montelukast (SINGULAIR) 10 MG tablet TAKE 1 TABLET(10 MG) BY MOUTH AT BEDTIME   Multiple Vitamins-Minerals (MULTIVITAMIN WITH MINERALS) tablet Take 1 tablet by mouth daily.   Multiple Vitamins-Minerals (PRESERVISION AREDS 2 PO) Take by mouth.   OMEGA 3 1000 MG CAPS Take 1,000 mg by mouth daily.   omeprazole (PRILOSEC) 20 MG capsule TAKE 1 CAPSULE BY MOUTH TWICE DAILY BEFORE A MEAL   psyllium (METAMUCIL) 58.6 % powder Take 1 packet by mouth daily.   Current Facility-Administered Medications for the 03/16/21 encounter (Office Visit) with Caren Macadam, MD  Medication   albuterol (PROVENTIL) (2.5 MG/3ML) 0.083% nebulizer solution 2.5 mg   Social History   Tobacco Use   Smoking status: Former    Years: 5.00    Types: Cigarettes    Quit date: 05/03/1965    Years since quitting:  55.9   Smokeless tobacco: Never   Tobacco comments:    smoked 1962-1967, less than 1 ppd  Substance Use Topics   Alcohol use: Yes    Alcohol/week: 14.0 standard drinks    Types: 14 Glasses of wine per week    Comment: 2-3 /day   Family History  Problem Relation Age of Onset   Breast cancer Mother 13   Colon cancer Mother 8   Cervical cancer Mother 19       s/p hysterectomy   Heart attack Father 21   Diabetes Father        probable pre DM   Skin cancer Father        maybe melanoma   Cancer Sister 68       appendiceal   Heart attack  Maternal Grandfather 57   Heart attack Paternal Uncle        X2 ; >50y   Heart attack Maternal Uncle        d. 68   Parkinson's disease Maternal Grandmother        d. 26   Heart attack Paternal Grandfather    Breast cancer Other        unspecified age; maternal great aunt (MGM's sister)   Breast cancer Cousin        maternal 1st cousin, once-removed; unspecified age   Heart attack Paternal Uncle        >50y   Stroke Neg Hx      Review of Systems  Constitutional:  Negative for activity change, appetite change, chills, fatigue, fever and unexpected weight change.  HENT:  Negative for congestion, ear pain, hearing loss, sinus pressure, sinus pain, sore throat and trouble swallowing.   Eyes:  Negative for pain and visual disturbance.  Respiratory:  Positive for shortness of breath. Negative for cough, chest tightness and wheezing.   Cardiovascular:  Negative for chest pain, palpitations and leg swelling.  Gastrointestinal:  Negative for abdominal pain, blood in stool, constipation, diarrhea, nausea and vomiting.  Genitourinary:  Negative for difficulty urinating and menstrual problem.       Sometimes will have incontinence - notes more in morning when trying to get to bathroom after waking from sleep.   Musculoskeletal:  Positive for arthralgias (see hpi). Negative for back pain.  Skin:  Negative for rash.       Itching bilat ankles   Neurological:  Negative for dizziness, weakness, numbness and headaches.  Hematological:  Negative for adenopathy. Does not bruise/bleed easily.  Psychiatric/Behavioral:  Negative for sleep disturbance and suicidal ideas. The patient is not nervous/anxious.    CBC:  Lab Results  Component Value Date   WBC 4.3 03/16/2021   HGB 14.1 03/16/2021   HGB 13.1 12/09/2015   HCT 42.7 03/16/2021   HCT 39.9 12/09/2015   MCH 30.5 02/18/2020   MCHC 32.9 03/16/2021   RDW 13.4 03/16/2021   RDW 13.3 12/09/2015   PLT 207.0 03/16/2021   PLT 217 12/09/2015   MPV 11.7 02/18/2020   CMP: Lab Results  Component Value Date   NA 143 03/16/2021   NA 143 12/09/2015   K 4.2 03/16/2021   K 4.0 12/09/2015   CL 104 03/16/2021   CL 104 06/08/2012   CO2 29 03/16/2021   CO2 29 12/09/2015   ANIONGAP 10 12/09/2015   GLUCOSE 97 03/16/2021   GLUCOSE 103 12/09/2015   GLUCOSE 105 (H) 06/08/2012   BUN 15 03/16/2021   BUN 17.3 12/09/2015   CREATININE 0.75 03/16/2021   CREATININE 0.70 02/18/2020   CREATININE 0.8 12/09/2015   GFRAA >60 01/11/2011   CALCIUM 10.0 03/16/2021   CALCIUM 10.3 12/09/2015   PROT 7.7 03/16/2021   PROT 7.4 12/09/2015   BILITOT 0.9 03/16/2021   BILITOT 0.52 12/09/2015   ALKPHOS 43 03/16/2021   ALKPHOS 46 12/09/2015   ALT 42 (H) 03/16/2021   ALT 47 12/09/2015   AST 31 03/16/2021   AST 30 12/09/2015   LIPID: Lab Results  Component Value Date   CHOL 256 (H) 03/16/2021   TRIG 74.0 03/16/2021   HDL 72.60 03/16/2021   LDLCALC 168 (H) 03/16/2021   LDLCALC 141 (H) 02/18/2020    Objective:  BP 120/60 (BP Location: Right Arm, Patient Position: Sitting, Cuff Size: Normal)   Pulse 88   Temp 98.2  F (36.8 C) (Oral)   Ht 4' 8.75" (1.441 m)   Wt 115 lb 1.6 oz (52.2 kg)   SpO2 99%   BMI 25.13 kg/m   Weight: 115 lb 1.6 oz (52.2 kg)   BP Readings from Last 3 Encounters:  03/16/21 120/60  02/04/21 137/69  01/15/21 122/72   Wt Readings from Last 3 Encounters:  03/16/21 115  lb 1.6 oz (52.2 kg)  02/04/21 114 lb 14.4 oz (52.1 kg)  01/15/21 115 lb (52.2 kg)    Physical Exam Constitutional:      General: She is not in acute distress.    Appearance: She is well-developed.  HENT:     Head: Normocephalic and atraumatic.     Right Ear: External ear normal.     Left Ear: External ear normal.     Mouth/Throat:     Pharynx: No oropharyngeal exudate.  Eyes:     Conjunctiva/sclera: Conjunctivae normal.     Pupils: Pupils are equal, round, and reactive to light.  Neck:     Thyroid: No thyromegaly.  Cardiovascular:     Rate and Rhythm: Normal rate and regular rhythm.     Heart sounds: Normal heart sounds. No murmur heard.   No friction rub. No gallop.  Pulmonary:     Effort: Pulmonary effort is normal.     Breath sounds: Normal breath sounds.  Abdominal:     General: Bowel sounds are normal. There is no distension.     Palpations: Abdomen is soft. There is no mass.     Tenderness: There is no abdominal tenderness. There is no guarding.     Hernia: No hernia is present.  Musculoskeletal:        General: No tenderness or deformity. Normal range of motion.     Cervical back: Normal range of motion and neck supple.     Comments: Bilateral bony enlargement first metacarpal phalangeal joint.  Left thumb is medially displaced  Lymphadenopathy:     Cervical: No cervical adenopathy.  Skin:    General: Skin is warm and dry.     Findings: No rash.     Comments: No significant abnormality noted bilateral ankles.  Minor varicosities.  Neurological:     Mental Status: She is alert and oriented to person, place, and time.     Deep Tendon Reflexes: Reflexes normal.     Reflex Scores:      Tricep reflexes are 2+ on the right side and 2+ on the left side.      Bicep reflexes are 2+ on the right side and 2+ on the left side.      Brachioradialis reflexes are 2+ on the right side and 2+ on the left side.      Patellar reflexes are 2+ on the right side and 2+ on the left  side. Psychiatric:        Speech: Speech normal.        Behavior: Behavior normal.        Thought Content: Thought content normal.    Assessment/Plan: Health Maintenance Due  Topic Date Due   COVID-19 Vaccine (4 - Booster for Pfizer series) 03/20/2020   Health Maintenance reviewed.  1. Preventative health care She is up-to-date with preventative health care.  2. Primary hypertension Blood pressures been well controlled.  Continue current medications. - CBC with Differential/Platelet; Future - Comprehensive metabolic panel; Future - Comprehensive metabolic panel - CBC with Differential/Platelet  3. Mild intermittent asthma, unspecified whether complicated She prefers  not to use the Symbicort more than necessary due to concerns with eye pressures and prolonged inhaled steroid use.  She is happy with breathing and does not feel that this affects her quality of life.  We discussed using albuterol inhaler prior to walking as this may help some with breathing and would not affect high pressure in the same way that Symbicort does. - albuterol (VENTOLIN HFA) 108 (90 Base) MCG/ACT inhaler; Inhale 2 puffs into the lungs every 6 (six) hours as needed for wheezing or shortness of breath.  Dispense: 8 g; Refill: 0 - Pneumococcal conjugate vaccine 20-valent (Prevnar 20)  4. OSA (obstructive sleep apnea) Continues to use oral appliance for sleep.  5. Gastroesophageal reflux disease, unspecified whether esophagitis present Continue with omeprazole.  6. Osteopenia, unspecified location She is due for repeat bone density.  Continue with weightbearing exercise. - DG Bone Density; Future  7. Fasting hyperglycemia Has been diet controlled. - Hemoglobin A1c; Future - Hemoglobin A1c  8. Hyperlipidemia, unspecified hyperlipidemia type Has been diet controlled. - Comprehensive metabolic panel; Future - Lipid panel; Future - Lipid panel - Comprehensive metabolic panel  9. Shortness of  breath - albuterol (VENTOLIN HFA) 108 (90 Base) MCG/ACT inhaler; Inhale 2 puffs into the lungs every 6 (six) hours as needed for wheezing or shortness of breath.  Dispense: 8 g; Refill: 0 - EKG 12-Lead  10. Need for pneumococcal vaccination  - Pneumococcal conjugate vaccine 20-valent (Prevnar 20)     Return in about 6 months (around 09/13/2021) for Chronic condition visit.  Micheline Rough, MD

## 2021-03-18 MED ORDER — ROSUVASTATIN CALCIUM 5 MG PO TABS: 5.0000 mg | ORAL_TABLET | Freq: Every day | ORAL | 1 refills | Status: DC

## 2021-03-18 NOTE — Addendum Note (Signed)
Addended by: Agnes Lawrence on: 03/18/2021 02:43 PM   Modules accepted: Orders

## 2021-03-31 NOTE — Progress Notes (Signed)
[place] Phone: [phone] Subjective:   Courtney [name], am serving as a scribe for Dr. Hulan Saas.  This visit occurred during the SARS-CoV-2 public health emergency.  Safety protocols were in place, including screening questions prior to the visit, additional usage of staff PPE, and extensive cleaning of exam room while observing appropriate contact time as indicated for disinfecting solutions.   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  Patient still has some effusion noted of the knee.  Patient notes feeling better and feels like the brace does not make as much significant improvement.  We encourage patient to try more of the icing regimen which she has not been doing.  Patient wants to hold on any injection or potential formal physical therapy at this point but we will bring those up again at follow-up if necessary in 2 to 3 months.  Update 04/01/2021 Courtney [name] is a 75 y.o. female coming in with complaint of R knee pain. Patient states that she is doing much better. [name] swelling since last visit. [name] pain with activity.   States she is having B CMC joint pain. Patient notes sleeping with thumb tucked into her hand.   Also complains of R shoulder and L hip pain. Pain in hip is chronic and worse when sedentary. Superior R shoulder pain when she lies on her side. [name] pain during the day.    Patient's previous x-rays do show degenerative changes most in the medial compartment.    Past Medical History:  Diagnosis Date   Breast cancer [facility]) July 2012   post double mastectomy; Dr Humphrey Rolls   Diverticulosis    Family history of breast cancer    GERD (gastroesophageal reflux disease)    S/P dilation X 1   Hemorrhoids    Hypertension    Night sweats    OSA  (obstructive sleep apnea) 08/03/2017   Very mild with AHI 7.2/hr.     Prolapsed internal hemorrhoids, grade 3 06/27/2017   Grade 2 on anoscopy exam but grade 3 by history and seen a colonoscopy as well All 3 columns banded 06/27/2017  09/21/2017 banded RP and RA     Reactive airway disease    Past Surgical History:  Procedure Laterality Date   ABDOMINAL HYSTERECTOMY     bilateral breast reconstruction  Jan 2013   BREAST SURGERY  01/13/11   bilateral total mastectomy; Dr Dalbert Batman   COLONOSCOPY  2014   Dr Olevia Perches; hyperplastic polyp   ESOPHAGEAL DILATION  2014   HEMORRHOID BANDING  2019   February and May   NASAL SEPTUM SURGERY     RECONSTRUCTION / CORRECTION OF NIPPLE / Tresa Moore  June 2013   THYROGLOSSAL DUCT CYST     Social History   Socioeconomic History   Marital status: Married    Spouse name: Not on file   Number of children: Not on file   Years of education: Not on file   Highest education level: Not on file  Occupational History   Not on file  Tobacco Use   Smoking status: Former    Years: 5.00    Types: Cigarettes    Quit date: 05/03/1965    Years since quitting: 55.9   Smokeless tobacco: Never   Tobacco comments:    smoked 1962-1967,  less than 1 ppd  Vaping Use   Vaping Use: Never used  Substance and Sexual Activity   Alcohol use: Yes    Alcohol/week: 14.0 standard drinks    Types: 14 Glasses of wine per week    Comment: 2-3 /day   Drug use: [name]   Sexual activity: Yes  Other Topics Concern   Not on file  Social History Narrative   Married   2-3 glasses wine/day   remote small vol smmoker   Social Determinants of Health   Financial Resource Strain: Low Risk    Difficulty of Paying Living Expenses: Not hard at all  Food Insecurity: [name] Food Insecurity   Worried About Charity fundraiser in the Last Year: Never true   Arboriculturist in the Last Year: Never true  Transportation Needs: [name] Transportation Needs   Lack of Transportation (Medical): [name]   Lack of  Transportation (Non-Medical): [name]  Physical Activity: Sufficiently Active   Days of Exercise per Week: 5 days   Minutes of Exercise per Session: 30 min  Stress: [name] Stress Concern Present   Feeling of Stress : Not at all  Social Connections: Socially Integrated   Frequency of Communication with Friends and Family: More than three times a week   Frequency of Social Gatherings with Friends and Family: More than three times a week   Attends Religious Services: More than 4 times per year   Active Member of [employer] or Organizations: Yes   Attends Music therapist: More than 4 times per year   Marital Status: Married   [name] Known Allergies Family History  Problem Relation Age of Onset   Breast cancer Mother 40   Colon cancer Mother 4   Cervical cancer Mother 85       s/p hysterectomy   Heart attack Father 83   Diabetes Father        probable pre DM   Skin cancer Father        maybe melanoma   Cancer Sister 50       appendiceal   Heart attack Maternal Grandfather 57   Heart attack Paternal Uncle        X2 ; >50y   Heart attack Maternal Uncle        d. 88   Parkinson's disease Maternal Grandmother        d. 47   Heart attack Paternal Grandfather    Breast cancer Other        unspecified age; maternal great aunt (MGM's sister)   Breast cancer Cousin        maternal 1st cousin, once-removed; unspecified age   Heart attack Paternal Uncle        >50y   Stroke Neg Hx       Current Outpatient Medications (Cardiovascular):    amLODipine (NORVASC) 5 MG tablet, TAKE 1 TABLET(5 MG) BY MOUTH DAILY   hydrochlorothiazide (MICROZIDE) 12.5 MG capsule, TAKE 1 CAPSULE(12.5 MG) BY MOUTH DAILY   losartan (COZAAR) 100 MG tablet, TAKE 1 TABLET(100 MG) BY MOUTH DAILY   rosuvastatin (CRESTOR) 5 MG tablet, Take 1 tablet (5 mg total) by mouth daily.   Current Outpatient Medications (Respiratory):    albuterol (PROVENTIL) (2.5 MG/3ML) 0.083% nebulizer solution, Take 3 mLs (2.5 mg total)  by nebulization every 6 (six) hours as needed for wheezing or shortness of breath.   albuterol (VENTOLIN HFA) 108 (90 Base) MCG/ACT inhaler, Inhale 2 puffs into the lungs every 6 (six) hours  as needed for wheezing or shortness of breath.   budesonide-formoterol (SYMBICORT) 160-4.5 MCG/ACT inhaler, INHALE 1 TO 2 PUFFS EVERY 12 HOURS AS NEEDED. GARGLE AND SPIT AFTER USE   montelukast (SINGULAIR) 10 MG tablet, TAKE 1 TABLET(10 MG) BY MOUTH AT BEDTIME  Current Facility-Administered Medications (Respiratory):    albuterol (PROVENTIL) (2.5 MG/3ML) 0.083% nebulizer solution 2.5 mg  Current Outpatient Medications (Analgesics):    aspirin 81 MG tablet, Take 81 mg by mouth daily.     Current Outpatient Medications (Other):    calcium citrate-vitamin D (CITRACAL+D) 315-200 MG-UNIT per tablet, Take 1 tablet by mouth daily.   Carboxymethylcellulose Sod PF (RETAINE CMC) 0.5 % SOLN, Apply to eye.   Cholecalciferol (VITAMIN D3) 1000 UNITS CAPS, Take by mouth daily.   co-enzyme Q-10 30 MG capsule, Take 50 mg by mouth daily.   FINACEA 15 % cream, Apply 1 application topically 2 (two) times daily. For rosacea   Misc Natural Products (TART CHERRY ADVANCED PO), Take 2,400 mg by mouth daily.   Multiple Vitamins-Minerals (MULTIVITAMIN WITH MINERALS) tablet, Take 1 tablet by mouth daily.   Multiple Vitamins-Minerals (PRESERVISION AREDS 2 PO), Take by mouth.   OMEGA 3 1000 MG CAPS, Take 1,000 mg by mouth daily.   omeprazole (PRILOSEC) 20 MG capsule, TAKE 1 CAPSULE BY MOUTH TWICE DAILY BEFORE A MEAL   psyllium (METAMUCIL) 58.6 % powder, Take 1 packet by mouth daily.    Reviewed prior external information including notes and imaging from  primary care provider As well as notes that were available from care everywhere and other healthcare systems.  Past medical history, social, surgical and family history all reviewed in electronic medical record.  [name] pertanent information unless stated regarding to the chief  complaint.   Review of Systems:  [name] headache, visual changes, nausea, vomiting, diarrhea, constipation, dizziness, abdominal pain, skin rash, fevers, chills, night sweats, weight loss, swollen lymph nodes, body aches, joint swelling, chest pain, shortness of breath, mood changes. POSITIVE muscle aches  Objective  There were [name] vitals taken for this visit.   General: [name] apparent distress alert and oriented x3 mood and affect normal, dressed appropriately.  HEENT: Pupils equal, extraocular movements intact  Respiratory: Patient's speak in full sentences and does not appear short of breath  Cardiovascular: [name] lower extremity edema, non tender, [name] erythema  Patient's right knee still has arthritic changes but [name] significant swelling noted. CMC joints do have arthritic changes but patient does have a nodule is noted at the A2 pulley. Right shoulder exam shows tenderness over the acromioclavicular joint.  Positive crossover.  Rotator cuff strength does appear to be intact.  [name] significant weakness noted. Left hand does have some tenderness to palpation over the greater trochanteric area.  Mild pain over the gluteal tendon.  [name] pain in the back itself.  [id number]; 15 additional minutes spent for Therapeutic exercises as stated in above notes.  This included exercises focusing on stretching, strengthening, with significant focus on eccentric aspects.   Long term goals include an improvement in range of motion, strength, endurance as well as avoiding reinjury. Patient's frequency would include in 1-2 times a day, 3-5 times a week for a duration of 6-12 weeks. Hip strengthening exercises which included:  Pelvic tilt/bracing to help with proper recruitment of the lower abs and pelvic floor muscles  Glute strengthening to properly contract glutes without over-engaging low back and hamstrings - prone hip extension and glute bridge exercises Proper stretching techniques to increase effectiveness for the hip  flexors, groin, quads, piriformic and low back when appropriate   Proper technique shown and discussed handout in great detail with ATC.  All questions were discussed and answered.      Impression and Recommendations:     The above documentation has been reviewed and is accurate and complete Lyndal Pulley, DO

## 2021-04-01 ENCOUNTER — Encounter: Payer: Self-pay | Admitting: Family Medicine

## 2021-04-01 ENCOUNTER — Other Ambulatory Visit: Payer: Self-pay

## 2021-04-01 ENCOUNTER — Ambulatory Visit (INDEPENDENT_AMBULATORY_CARE_PROVIDER_SITE_OTHER): Payer: Medicare PPO

## 2021-04-01 ENCOUNTER — Ambulatory Visit: Payer: Medicare PPO | Admitting: Family Medicine

## 2021-04-01 VITALS — BP 132/86 | HR 67 | Ht <= 58 in | Wt 116.0 lb

## 2021-04-01 DIAGNOSIS — M7062 Trochanteric bursitis, left hip: Secondary | ICD-10-CM

## 2021-04-01 DIAGNOSIS — M19019 Primary osteoarthritis, unspecified shoulder: Secondary | ICD-10-CM | POA: Insufficient documentation

## 2021-04-01 DIAGNOSIS — M25552 Pain in left hip: Secondary | ICD-10-CM | POA: Diagnosis not present

## 2021-04-01 DIAGNOSIS — M25511 Pain in right shoulder: Secondary | ICD-10-CM

## 2021-04-01 DIAGNOSIS — M7072 Other bursitis of hip, left hip: Secondary | ICD-10-CM | POA: Insufficient documentation

## 2021-04-01 NOTE — Assessment & Plan Note (Signed)
Discussed home exercises.  Patient declined injection.  Work with Product/process development scientist to learn home exercises.  Follow-up again in 6 to 8 weeks.

## 2021-04-01 NOTE — Patient Instructions (Signed)
Always good to see you  Ice 20 minutes 2 times daily. Usually after activity and before bed. Exercises 3 times a week.  Get xray of shoulder and hip today  Keep hands within peripheral vision as much as you can  See me again in 6-8 weeks and if not perfect we can consider pT or injections Happy holidays!

## 2021-04-01 NOTE — Assessment & Plan Note (Signed)
Likely mild to moderate arthritic changes.  No significant weakness of the rotator cuff.  Discussed icing regimen and home exercises.  Discussed which activities to do which wants to avoid.  Worsening pain will consider the possibility of formal physical therapy and potential injections.

## 2021-04-07 DIAGNOSIS — H40053 Ocular hypertension, bilateral: Secondary | ICD-10-CM | POA: Diagnosis not present

## 2021-04-15 DIAGNOSIS — L905 Scar conditions and fibrosis of skin: Secondary | ICD-10-CM | POA: Diagnosis not present

## 2021-04-15 DIAGNOSIS — D485 Neoplasm of uncertain behavior of skin: Secondary | ICD-10-CM | POA: Diagnosis not present

## 2021-04-15 DIAGNOSIS — L814 Other melanin hyperpigmentation: Secondary | ICD-10-CM | POA: Diagnosis not present

## 2021-04-15 DIAGNOSIS — L821 Other seborrheic keratosis: Secondary | ICD-10-CM | POA: Diagnosis not present

## 2021-04-15 DIAGNOSIS — C44519 Basal cell carcinoma of skin of other part of trunk: Secondary | ICD-10-CM | POA: Diagnosis not present

## 2021-04-15 DIAGNOSIS — Z85828 Personal history of other malignant neoplasm of skin: Secondary | ICD-10-CM | POA: Diagnosis not present

## 2021-04-15 DIAGNOSIS — D225 Melanocytic nevi of trunk: Secondary | ICD-10-CM | POA: Diagnosis not present

## 2021-04-15 DIAGNOSIS — I788 Other diseases of capillaries: Secondary | ICD-10-CM | POA: Diagnosis not present

## 2021-04-15 DIAGNOSIS — L570 Actinic keratosis: Secondary | ICD-10-CM | POA: Diagnosis not present

## 2021-04-15 DIAGNOSIS — D1801 Hemangioma of skin and subcutaneous tissue: Secondary | ICD-10-CM | POA: Diagnosis not present

## 2021-05-07 DIAGNOSIS — Z780 Asymptomatic menopausal state: Secondary | ICD-10-CM | POA: Diagnosis not present

## 2021-05-07 LAB — HM DEXA SCAN: HM Dexa Scan: NORMAL

## 2021-05-12 NOTE — Progress Notes (Signed)
Courtney [name] [address] [place] Phone: [phone] Subjective:   IVilma [name], am serving as a scribe for Dr. Hulan Saas. This visit occurred during the SARS-CoV-2 public health emergency.  Safety protocols were in place, including screening questions prior to the visit, additional usage of staff PPE, and extensive cleaning of exam room while observing appropriate contact time as indicated for disinfecting solutions.   I'm seeing this patient by the request  of:  Koberlein, Steele Berg, MD  CC: right shoulder and left hip pain   AJO:[id number]  04/01/2021 Discussed home exercises.  Patient declined injection.  Work with Product/process development scientist to learn home exercises.  Follow-up again in 6 to 8 weeks.  Likely mild to moderate arthritic changes.  No significant weakness of the rotator cuff.  Discussed icing regimen and home exercises.  Discussed which activities to do which wants to avoid.  Worsening pain will consider the possibility of formal physical therapy and potential injections.  Update 05/13/2021 Courtney [name] is a 76 y.o. female coming in with complaint of R shoulder and L hip pain. Patient states shoulder and hip are better. No new complaints.  Xray R shoulder 04/01/2021 IMPRESSION: Mild degenerative changes, as described above.    Xray L hip 04/01/2021 IMPRESSION: Degenerative changes, as described above, without an acute osseous abnormality.       Past Medical History:  Diagnosis Date   Breast cancer [facility]) July 2012   post double mastectomy; Dr Humphrey Rolls   Diverticulosis    Family history of breast cancer    GERD (gastroesophageal reflux disease)    S/P dilation X 1   Hemorrhoids    Hypertension    Night sweats    OSA (obstructive sleep apnea) 08/03/2017   Very mild with AHI 7.2/hr.     Prolapsed internal hemorrhoids, grade 3 06/27/2017   Grade 2 on anoscopy exam but grade 3 by history and seen a colonoscopy as well All  3 columns banded 06/27/2017  09/21/2017 banded RP and RA     Reactive airway disease    Past Surgical History:  Procedure Laterality Date   ABDOMINAL HYSTERECTOMY     bilateral breast reconstruction  Jan 2013   BREAST SURGERY  01/13/11   bilateral total mastectomy; Dr Dalbert Batman   COLONOSCOPY  2014   Dr Olevia Perches; hyperplastic polyp   ESOPHAGEAL DILATION  2014   HEMORRHOID BANDING  2019   February and May   NASAL SEPTUM SURGERY     RECONSTRUCTION / CORRECTION OF NIPPLE / Tresa Moore  June 2013   THYROGLOSSAL DUCT CYST     Social History   Socioeconomic History   Marital status: Married    Spouse name: Not on file   Number of children: Not on file   Years of education: Not on file   Highest education level: Not on file  Occupational History   Not on file  Tobacco Use   Smoking status: Former    Years: 5.00    Types: Cigarettes    Quit date: 05/03/1965    Years since quitting: 56.0   Smokeless tobacco: Never   Tobacco comments:    smoked 1962-1967, less than 1 ppd  Vaping Use   Vaping Use: Never used  Substance and Sexual Activity   Alcohol use: Yes    Alcohol/week: 14.0 standard drinks    Types: 14 Glasses of wine per week    Comment: 2-3 /day   Drug use: No  Sexual activity: Yes  Other Topics Concern   Not on file  Social History Narrative   Married   2-3 glasses wine/day   remote small vol smmoker   Social Determinants of Health   Financial Resource Strain: Low Risk    Difficulty of Paying Living Expenses: Not hard at all  Food Insecurity: No Food Insecurity   Worried About Charity fundraiser in the Last Year: Never true   Arboriculturist in the Last Year: Never true  Transportation Needs: No Transportation Needs   Lack of Transportation (Medical): No   Lack of Transportation (Non-Medical): No  Physical Activity: Sufficiently Active   Days of Exercise per Week: 5 days   Minutes of Exercise per Session: 30 min  Stress: No Stress Concern Present   Feeling of Stress  : Not at all  Social Connections: Socially Integrated   Frequency of Communication with Friends and Family: More than three times a week   Frequency of Social Gatherings with Friends and Family: More than three times a week   Attends Religious Services: More than 4 times per year   Active Member of [employer] or Organizations: Yes   Attends Music therapist: More than 4 times per year   Marital Status: Married   No Known Allergies Family History  Problem Relation Age of Onset   Breast cancer Mother 60   Colon cancer Mother 69   Cervical cancer Mother 78       s/p hysterectomy   Heart attack Father 30   Diabetes Father        probable pre DM   Skin cancer Father        maybe melanoma   Cancer Sister 30       appendiceal   Heart attack Maternal Grandfather 57   Heart attack Paternal Uncle        X2 ; >50y   Heart attack Maternal Uncle        d. 42   Parkinson's disease Maternal Grandmother        d. 44   Heart attack Paternal Grandfather    Breast cancer Other        unspecified age; maternal great aunt (MGM's sister)   Breast cancer Cousin        maternal 1st cousin, once-removed; unspecified age   Heart attack Paternal Uncle        >50y   Stroke Neg Hx       Current Outpatient Medications (Cardiovascular):    amLODipine (NORVASC) 5 MG tablet, TAKE 1 TABLET(5 MG) BY MOUTH DAILY   hydrochlorothiazide (MICROZIDE) 12.5 MG capsule, TAKE 1 CAPSULE(12.5 MG) BY MOUTH DAILY   losartan (COZAAR) 100 MG tablet, TAKE 1 TABLET(100 MG) BY MOUTH DAILY   rosuvastatin (CRESTOR) 5 MG tablet, Take 1 tablet (5 mg total) by mouth daily.   Current Outpatient Medications (Respiratory):    albuterol (PROVENTIL) (2.5 MG/3ML) 0.083% nebulizer solution, Take 3 mLs (2.5 mg total) by nebulization every 6 (six) hours as needed for wheezing or shortness of breath.   albuterol (VENTOLIN HFA) 108 (90 Base) MCG/ACT inhaler, Inhale 2 puffs into the lungs every 6 (six) hours as needed for  wheezing or shortness of breath.   budesonide-formoterol (SYMBICORT) 160-4.5 MCG/ACT inhaler, INHALE 1 TO 2 PUFFS EVERY 12 HOURS AS NEEDED. GARGLE AND SPIT AFTER USE   montelukast (SINGULAIR) 10 MG tablet, TAKE 1 TABLET(10 MG) BY MOUTH AT BEDTIME  Current Facility-Administered Medications (Respiratory):  albuterol (PROVENTIL) (2.5 MG/3ML) 0.083% nebulizer solution 2.5 mg  Current Outpatient Medications (Analgesics):    aspirin 81 MG tablet, Take 81 mg by mouth daily.     Current Outpatient Medications (Other):    calcium citrate-vitamin D (CITRACAL+D) 315-200 MG-UNIT per tablet, Take 1 tablet by mouth daily.   Carboxymethylcellulose Sod PF (RETAINE CMC) 0.5 % SOLN, Apply to eye.   Cholecalciferol (VITAMIN D3) 1000 UNITS CAPS, Take by mouth daily.   co-enzyme Q-10 30 MG capsule, Take 50 mg by mouth daily.   FINACEA 15 % cream, Apply 1 application topically 2 (two) times daily. For rosacea   Misc Natural Products (TART CHERRY ADVANCED PO), Take 2,400 mg by mouth daily.   Multiple Vitamins-Minerals (MULTIVITAMIN WITH MINERALS) tablet, Take 1 tablet by mouth daily.   Multiple Vitamins-Minerals (PRESERVISION AREDS 2 PO), Take by mouth.   OMEGA 3 1000 MG CAPS, Take 1,000 mg by mouth daily.   omeprazole (PRILOSEC) 20 MG capsule, TAKE 1 CAPSULE BY MOUTH TWICE DAILY BEFORE A MEAL   psyllium (METAMUCIL) 58.6 % powder, Take 1 packet by mouth daily.     Objective  Blood pressure 116/66, pulse (!) 59, height 4\' 8"  (1.422 m), weight 118 lb (53.5 kg), SpO2 98 %.   General: No apparent distress alert and oriented x3 mood and affect normal, dressed appropriately.  HEENT: Pupils equal, extraocular movements intact  Respiratory: Patient's speak in full sentences and does not appear short of breath  Cardiovascular: No lower extremity edema, non tender, no erythema  Gait normal with good balance and coordination.  MSK: Patient has good range of motion.  Does have arthritic changes but nothing  severe on pain today.  Full range of motion of the shoulders.  No significant tenderness of the left hip.  Knee is unremarkable.    Impression and Recommendations:     The above documentation has been reviewed and is accurate and complete Lyndal Pulley, DO

## 2021-05-13 ENCOUNTER — Other Ambulatory Visit: Payer: Self-pay

## 2021-05-13 ENCOUNTER — Ambulatory Visit: Payer: Medicare PPO | Admitting: Family Medicine

## 2021-05-13 ENCOUNTER — Ambulatory Visit: Payer: Self-pay

## 2021-05-13 VITALS — BP 116/66 | HR 59 | Ht <= 58 in | Wt 118.0 lb

## 2021-05-13 DIAGNOSIS — M19019 Primary osteoarthritis, unspecified shoulder: Secondary | ICD-10-CM

## 2021-05-13 NOTE — Assessment & Plan Note (Signed)
No pain, full rom  No follow up needed

## 2021-05-18 ENCOUNTER — Encounter: Payer: Self-pay | Admitting: Family Medicine

## 2021-05-20 ENCOUNTER — Telehealth: Payer: Self-pay

## 2021-05-20 NOTE — Telephone Encounter (Signed)
Bone Density results within normal limits. Per Dr Ethlyn Gallery, pt instructed to keep up with Vitamin D Supplements & weight bearing exercises. Pt verb understanding & has no ques/concerns at this time.

## 2021-06-08 ENCOUNTER — Encounter: Payer: Self-pay | Admitting: Family Medicine

## 2021-06-08 ENCOUNTER — Other Ambulatory Visit: Payer: Self-pay | Admitting: Family Medicine

## 2021-06-08 MED ORDER — BUDESONIDE-FORMOTEROL FUMARATE 160-4.5 MCG/ACT IN AERO: INHALATION_SPRAY | RESPIRATORY_TRACT | 11 refills | Status: DC

## 2021-06-12 ENCOUNTER — Other Ambulatory Visit: Payer: Self-pay | Admitting: Family Medicine

## 2021-06-12 MED ORDER — HYDROCODONE BIT-HOMATROP MBR 5-1.5 MG/5ML PO SOLN: 5.0000 mL | Freq: Four times a day (QID) | ORAL | 0 refills | Status: DC | PRN

## 2021-06-13 ENCOUNTER — Other Ambulatory Visit: Payer: Self-pay | Admitting: Family Medicine

## 2021-06-13 MED ORDER — BENZONATATE 100 MG PO CAPS: 100.0000 mg | ORAL_CAPSULE | Freq: Three times a day (TID) | ORAL | 0 refills | Status: DC | PRN

## 2021-06-13 MED ORDER — PREDNISONE 20 MG PO TABS: 40.0000 mg | ORAL_TABLET | Freq: Every day | ORAL | 0 refills | Status: AC

## 2021-06-17 ENCOUNTER — Telehealth: Payer: Self-pay | Admitting: Family Medicine

## 2021-06-17 NOTE — Telephone Encounter (Signed)
Left message for patient to call back and schedule Medicare Annual Wellness Visit (AWV) either virtually or in office. Left  my Herbie Drape number [phone]   Last AWV  06/25/20 ; please schedule at anytime with [facility] Nurse Health Advisor 1 or 2   This should be a 45 minute visit.

## 2021-06-19 ENCOUNTER — Other Ambulatory Visit: Payer: Self-pay

## 2021-06-19 ENCOUNTER — Ambulatory Visit (INDEPENDENT_AMBULATORY_CARE_PROVIDER_SITE_OTHER): Payer: Medicare PPO

## 2021-06-19 ENCOUNTER — Ambulatory Visit: Payer: Medicare PPO | Admitting: Family Medicine

## 2021-06-19 ENCOUNTER — Encounter: Payer: Self-pay | Admitting: Family Medicine

## 2021-06-19 VITALS — BP 118/70 | HR 68 | Temp 98.1°F | Resp 16 | Ht <= 58 in | Wt 118.1 lb

## 2021-06-19 DIAGNOSIS — R059 Cough, unspecified: Secondary | ICD-10-CM

## 2021-06-19 DIAGNOSIS — J45901 Unspecified asthma with (acute) exacerbation: Secondary | ICD-10-CM

## 2021-06-19 DIAGNOSIS — K449 Diaphragmatic hernia without obstruction or gangrene: Secondary | ICD-10-CM | POA: Diagnosis not present

## 2021-06-19 DIAGNOSIS — J310 Chronic rhinitis: Secondary | ICD-10-CM | POA: Diagnosis not present

## 2021-06-19 DIAGNOSIS — R0602 Shortness of breath: Secondary | ICD-10-CM | POA: Diagnosis not present

## 2021-06-19 MED ORDER — IPRATROPIUM BROMIDE 0.06 % NA SOLN: 2.0000 | Freq: Four times a day (QID) | NASAL | 3 refills | Status: DC

## 2021-06-19 MED ORDER — IPRATROPIUM-ALBUTEROL 0.5-2.5 (3) MG/3ML IN SOLN
3.0000 mL | Freq: Once | RESPIRATORY_TRACT | Status: AC
  Administered 2021-06-19: 1.5 mL via RESPIRATORY_TRACT

## 2021-06-19 MED ORDER — PREDNISONE 20 MG PO TABS: ORAL_TABLET | ORAL | 0 refills | Status: DC

## 2021-06-19 MED ORDER — HYDROCODONE BIT-HOMATROP MBR 5-1.5 MG/5ML PO SOLN: 5.0000 mL | Freq: Two times a day (BID) | ORAL | 0 refills | Status: DC | PRN

## 2021-06-19 NOTE — Patient Instructions (Addendum)
A few things to remember from today's visit:   Mild asthma with exacerbation, unspecified whether persistent - Plan: predniSONE (DELTASONE) 20 MG tablet, DG Chest 2 View  Cough, unspecified type - Plan: HYDROcodone bit-homatropine (HYCODAN) 5-1.5 MG/5ML syrup, DG Chest 2 View  Rhinitis, unspecified type - Plan: ipratropium (ATROVENT) 0.06 % nasal spray  If you need refills please call your pharmacy. Do not use My Chart to request refills or for acute issues that need immediate attention.   Continue Prednisone 40 mg for 3 days then 1 tab for 3 days then 1/2 tan for 3 days. Albuterol inh 2 puff every 6 hours for a week then as needed for wheezing or shortness of breath.  Plain mucinex. Nasal saline irrigations as needed. Atrovent nasal spray to help with nasal congestion. If symptoms are persistent it would be appropriate to start antibiotics.   Please be sure medication list is accurate. If a new problem present, please set up appointment sooner than planned today.

## 2021-06-19 NOTE — Progress Notes (Signed)
ACUTE VISIT Chief Complaint  Patient presents with   Asthma    Has been using albuterol in the middle of the night, symbicort 2x daily.   Cough    Ongoing since 2/6. Finished 5 day course of prednisone yesterday, has been using tessalon pearls.    HPI: Courtney [name] is a 76 y.o. female with history of asthma, OSA, hypertension, and GERD here today complaining of asthma symptoms as described above. Productive cough with clear sputum. SOB, exacerbated by exertion.  His husband was sick and she had similar symptoms, like a cold.  She completed 5 days of prednisone 40 mg , symptoms improved. Negative for fever,chills,sore throat, CP,palpitations, diaphoresis,orthopnea,PND,or edema. + Nocturnal cough and wheezing. Former smoker. Albuterol inh today 7 am. She is on Symbicort 160-4.5 mcg bid. She does not have frequent exacerbations and usually associated with viral illness. + Fatigue, nasal congestion,and rhinorrhea.  She had some Hycodan left from old Rx, did help some. She is taking Mucinex and Benzonatate.  Review of Systems  Constitutional:  Negative for appetite change and unexpected weight change.  HENT:  Positive for congestion and postnasal drip. Negative for ear pain, mouth sores, sinus pressure and voice change.   Eyes:  Negative for discharge, redness and itching.  Gastrointestinal:  Negative for abdominal pain, nausea and vomiting.  Genitourinary:  Negative for decreased urine volume, dysuria and hematuria.  Musculoskeletal:  Negative for gait problem and myalgias.  Skin:  Negative for rash.  Allergic/Immunologic: Positive for environmental allergies.  Neurological:  Negative for syncope, weakness and headaches.  Hematological:  Negative for adenopathy. Does not bruise/bleed easily.  Psychiatric/Behavioral:  Positive for sleep disturbance. Negative for confusion.   Rest see pertinent positives and negatives per HPI.  Current Outpatient Medications on File Prior to  Visit  Medication Sig Dispense Refill   albuterol (PROVENTIL) (2.5 MG/3ML) 0.083% nebulizer solution Take 3 mLs (2.5 mg total) by nebulization every 6 (six) hours as needed for wheezing or shortness of breath. 150 mL 1   albuterol (VENTOLIN HFA) 108 (90 Base) MCG/ACT inhaler Inhale 2 puffs into the lungs every 6 (six) hours as needed for wheezing or shortness of breath. 8 g 0   amLODipine (NORVASC) 5 MG tablet TAKE 1 TABLET(5 MG) BY MOUTH DAILY 90 tablet 1   aspirin 81 MG tablet Take 81 mg by mouth daily.     benzonatate (TESSALON PERLES) 100 MG capsule Take 1 capsule (100 mg total) by mouth 3 (three) times daily as needed for cough. 30 capsule 0   budesonide-formoterol (SYMBICORT) 160-4.5 MCG/ACT inhaler INHALE 1 TO 2 PUFFS EVERY 12 HOURS AS NEEDED. GARGLE AND SPIT AFTER USE 10.2 g 11   calcium citrate-vitamin D (CITRACAL+D) 315-200 MG-UNIT per tablet Take 1 tablet by mouth daily.     Carboxymethylcellulose Sod PF (RETAINE CMC) 0.5 % SOLN Apply to eye.     Cholecalciferol (VITAMIN D3) 1000 UNITS CAPS Take by mouth daily.     co-enzyme Q-10 30 MG capsule Take 50 mg by mouth daily.     FINACEA 15 % cream Apply 1 application topically 2 (two) times daily. For rosacea  0   hydrochlorothiazide (MICROZIDE) 12.5 MG capsule TAKE 1 CAPSULE(12.5 MG) BY MOUTH DAILY 90 capsule 1   losartan (COZAAR) 100 MG tablet TAKE 1 TABLET(100 MG) BY MOUTH DAILY 90 tablet 1   Misc Natural Products (TART CHERRY ADVANCED PO) Take 2,400 mg by mouth daily.     montelukast (SINGULAIR) 10 MG tablet TAKE 1 TABLET(10  MG) BY MOUTH AT BEDTIME 90 tablet 3   Multiple Vitamins-Minerals (MULTIVITAMIN WITH MINERALS) tablet Take 1 tablet by mouth daily.     Multiple Vitamins-Minerals (PRESERVISION AREDS 2 PO) Take by mouth.     OMEGA 3 1000 MG CAPS Take 1,000 mg by mouth daily.     omeprazole (PRILOSEC) 20 MG capsule TAKE 1 CAPSULE BY MOUTH TWICE DAILY BEFORE A MEAL 180 capsule 1   psyllium (METAMUCIL) 58.6 % powder Take 1 packet by  mouth daily.     rosuvastatin (CRESTOR) 5 MG tablet Take 1 tablet (5 mg total) by mouth daily. 90 tablet 1   Current Facility-Administered Medications on File Prior to Visit  Medication Dose Route Frequency Provider Last Rate Last Admin   albuterol (PROVENTIL) (2.5 MG/3ML) 0.083% nebulizer solution 2.5 mg  2.5 mg Nebulization Once Dorena Cookey, MD        Past Medical History:  Diagnosis Date   Breast cancer [facility]) July 2012   post double mastectomy; Dr Humphrey Rolls   Diverticulosis    Family history of breast cancer    GERD (gastroesophageal reflux disease)    S/P dilation X 1   Hemorrhoids    Hypertension    Night sweats    OSA (obstructive sleep apnea) 08/03/2017   Very mild with AHI 7.2/hr.     Prolapsed internal hemorrhoids, grade 3 06/27/2017   Grade 2 on anoscopy exam but grade 3 by history and seen a colonoscopy as well All 3 columns banded 06/27/2017  09/21/2017 banded RP and RA     Reactive airway disease    No Known Allergies  Social History   Socioeconomic History   Marital status: Married    Spouse name: Not on file   Number of children: Not on file   Years of education: Not on file   Highest education level: Bachelor's degree (e.g., BA, AB, BS)  Occupational History   Not on file  Tobacco Use   Smoking status: Former    Years: 5.00    Types: Cigarettes    Quit date: 05/03/1965    Years since quitting: 56.1   Smokeless tobacco: Never   Tobacco comments:    smoked 1962-1967, less than 1 ppd  Vaping Use   Vaping Use: Never used  Substance and Sexual Activity   Alcohol use: Yes    Alcohol/week: 14.0 standard drinks    Types: 14 Glasses of wine per week    Comment: 2-3 /day   Drug use: No   Sexual activity: Yes  Other Topics Concern   Not on file  Social History Narrative   Married   2-3 glasses wine/day   remote small vol smmoker   Social Determinants of Health   Financial Resource Strain: Low Risk    Difficulty of Paying Living Expenses: Not hard at all   Food Insecurity: No Food Insecurity   Worried About Charity fundraiser in the Last Year: Never true   Arboriculturist in the Last Year: Never true  Transportation Needs: No Transportation Needs   Lack of Transportation (Medical): No   Lack of Transportation (Non-Medical): No  Physical Activity: Sufficiently Active   Days of Exercise per Week: 6 days   Minutes of Exercise per Session: 40 min  Stress: No Stress Concern Present   Feeling of Stress : Only a little  Social Connections: Engineer, building services of Communication with Friends and Family: Three times a week   Frequency of Social  Gatherings with Friends and Family: More than three times a week   Attends Religious Services: More than 4 times per year   Active Member of Clubs or Organizations: Yes   Attends Archivist Meetings: 1 to 4 times per year   Marital Status: Married   Vitals:   06/19/21 1050  BP: 118/70  Pulse: 68  Resp: 16  Temp: 98.1 F (36.7 C)  SpO2: 98%   Body mass index is 26.48 kg/m.  Physical Exam Vitals and nursing note reviewed.  Constitutional:      General: She is not in acute distress.    Appearance: She is well-developed. She is not ill-appearing.  HENT:     Head: Normocephalic and atraumatic.     Right Ear: Tympanic membrane, ear canal and external ear normal.     Left Ear: Tympanic membrane, ear canal and external ear normal.     Nose: Congestion and rhinorrhea present.     Right Turbinates: Enlarged.     Left Turbinates: Enlarged.     Right Sinus: No maxillary sinus tenderness or frontal sinus tenderness.     Left Sinus: No maxillary sinus tenderness or frontal sinus tenderness.     Mouth/Throat:     Mouth: Mucous membranes are moist.     Pharynx: Oropharynx is clear.  Eyes:     Conjunctiva/sclera: Conjunctivae normal.  Cardiovascular:     Rate and Rhythm: Normal rate and regular rhythm.     Heart sounds: No murmur heard. Pulmonary:     Effort: Pulmonary effort  is normal. No respiratory distress.     Breath sounds: Normal breath sounds. No stridor.  Musculoskeletal:     Cervical back: No edema or erythema. No muscular tenderness.  Lymphadenopathy:     Cervical: No cervical adenopathy.  Skin:    General: Skin is warm.     Findings: No erythema or rash.  Neurological:     Mental Status: She is alert and oriented to person, place, and time.  Psychiatric:     Comments: Well groomed, good eye contact.   ASSESSMENT AND PLAN:  Courtney [name] was seen today for asthma and cough.  Diagnoses and all orders for this visit: Orders Placed This Encounter  Procedures   DG Chest 2 View   Mild asthma with exacerbation, unspecified whether persistent Lung auscultation negative, we tried 1/2 dose of Duoneb, which she tolerated well, mildly prolonged expiration right base. Because persistent symptoms it would be appropriate to start empiric abx treatment, she would like to hold on this. Will continue Prednisone for a few more days, taper.  Continue plain mucinex.  -     predniSONE (DELTASONE) 20 MG tablet; 2 tabs for 3 days then 1 tab for 3 days and 1/2 tab for 3 days. -     DG Chest 2 View; Future -     ipratropium-albuterol (DUONEB) 0.5-2.5 (3) MG/3ML nebulizer solution 3 mL  Cough, unspecified type Continue benzonatate. She has taken Hycodan before and well tolerated, recommend taking it at bedtime. CXR will be obtained today.  -     HYDROcodone bit-homatropine (HYCODAN) 5-1.5 MG/5ML syrup; Take 5 mLs by mouth every 12 (twelve) hours as needed for up to 10 days for cough. -     DG Chest 2 View; Future  Rhinitis, unspecified type Recommend Atrovent nasal spray qid prn and nasal saline irrigations prn. F/U as needed.  -     ipratropium (ATROVENT) 0.06 % nasal spray; Place 2 sprays into  both nostrils 4 (four) times daily.  Return in about 10 days (around 06/29/2021).  Amye Grego G. [name], MD  [facility]. [place] office.

## 2021-06-21 ENCOUNTER — Encounter: Payer: Self-pay | Admitting: Family Medicine

## 2021-06-29 ENCOUNTER — Encounter: Payer: Self-pay | Admitting: Family Medicine

## 2021-06-29 ENCOUNTER — Ambulatory Visit: Payer: Medicare PPO | Admitting: Family Medicine

## 2021-06-29 VITALS — BP 100/56 | HR 65 | Temp 98.2°F | Ht <= 58 in | Wt 116.2 lb

## 2021-06-29 DIAGNOSIS — K219 Gastro-esophageal reflux disease without esophagitis: Secondary | ICD-10-CM

## 2021-06-29 DIAGNOSIS — J4520 Mild intermittent asthma, uncomplicated: Secondary | ICD-10-CM

## 2021-06-29 DIAGNOSIS — E785 Hyperlipidemia, unspecified: Secondary | ICD-10-CM | POA: Diagnosis not present

## 2021-06-29 LAB — COMPREHENSIVE METABOLIC PANEL
ALT: 52 U/L — ABNORMAL HIGH (ref 0–35)
AST: 25 U/L (ref 0–37)
Albumin: 4.5 g/dL (ref 3.5–5.2)
Alkaline Phosphatase: 32 U/L — ABNORMAL LOW (ref 39–117)
BUN: 25 mg/dL — ABNORMAL HIGH (ref 6–23)
CO2: 32 mEq/L (ref 19–32)
Calcium: 10 mg/dL (ref 8.4–10.5)
Chloride: 103 mEq/L (ref 96–112)
Creatinine, Ser: 0.83 mg/dL (ref 0.40–1.20)
GFR: 67.94 mL/min (ref >60.00)
Glucose, Bld: 124 mg/dL — ABNORMAL HIGH (ref 70–99)
Potassium: 3.7 mEq/L (ref 3.5–5.1)
Sodium: 142 mEq/L (ref 135–145)
Total Bilirubin: 0.9 mg/dL (ref 0.2–1.2)
Total Protein: 7 g/dL (ref 6.0–8.3)

## 2021-06-29 LAB — LIPID PANEL
Cholesterol: 191 mg/dL (ref 0–200)
HDL: 88.7 mg/dL (ref >39.00)
LDL Cholesterol: 81 mg/dL (ref 0–99)
NonHDL: 101.87
Total CHOL/HDL Ratio: 2
Triglycerides: 105 mg/dL (ref 0.0–149.0)
VLDL: 21 mg/dL (ref 0.0–40.0)

## 2021-06-29 MED ORDER — FAMOTIDINE 40 MG PO TABS
20.0000 mg | ORAL_TABLET | Freq: Two times a day (BID) | ORAL | 1 refills | Status: DC
Start: 2021-06-29 — End: 2021-11-17

## 2021-06-29 NOTE — Progress Notes (Signed)
Courtney [name] DOB: 23-Jan-1946 Encounter date: 06/29/2021  This is a 76 y.o. female who presents with Chief Complaint  Patient presents with   Follow-up    History of present illness: Last visit in the office was 2/17 for asthma exacerbation, cough. She is feeling so much better. Just little cough, still a little post nasal drainage. Just taking mucinex at night now. Still with productive cough, but clear mucous now.   Illness started around 6th of this month. Just kept progressing and coughing get over cough. She was given another round of prednisone at her last visit and this really helped her turn the corner.  Using symbicort regularly. Albuterol just if needed at night. Last use was last night, but feels she might not have needed it.  Patient has not taken anything besides omeprazole for acid reflux in the past. She sometimes forgets to take bid and then will have breakthrough symptoms. Has had esophagus stretched in the past. Sometimes she has food sticking issues; like it doesn't go all the way down. Insurance sent letter that they would no longer cover omeprazole. She wanted a letter written to get this approved.   No Known Allergies Current Meds  Medication Sig   albuterol (PROVENTIL) (2.5 MG/3ML) 0.083% nebulizer solution Take 3 mLs (2.5 mg total) by nebulization every 6 (six) hours as needed for wheezing or shortness of breath.   albuterol (VENTOLIN HFA) 108 (90 Base) MCG/ACT inhaler Inhale 2 puffs into the lungs every 6 (six) hours as needed for wheezing or shortness of breath.   amLODipine (NORVASC) 5 MG tablet TAKE 1 TABLET(5 MG) BY MOUTH DAILY   aspirin 81 MG tablet Take 81 mg by mouth daily.   budesonide-formoterol (SYMBICORT) 160-4.5 MCG/ACT inhaler INHALE 1 TO 2 PUFFS EVERY 12 HOURS AS NEEDED. GARGLE AND SPIT AFTER USE   calcium citrate-vitamin D (CITRACAL+D) 315-200 MG-UNIT per tablet Take 1 tablet by mouth daily.   Carboxymethylcellulose Sod PF (RETAINE CMC) 0.5 % SOLN  Apply to eye.   Cholecalciferol (VITAMIN D3) 1000 UNITS CAPS Take by mouth daily.   co-enzyme Q-10 30 MG capsule Take 50 mg by mouth daily.   FINACEA 15 % cream Apply 1 application topically 2 (two) times daily. For rosacea   hydrochlorothiazide (MICROZIDE) 12.5 MG capsule TAKE 1 CAPSULE(12.5 MG) BY MOUTH DAILY   ipratropium (ATROVENT) 0.06 % nasal spray Place 2 sprays into both nostrils 4 (four) times daily.   losartan (COZAAR) 100 MG tablet TAKE 1 TABLET(100 MG) BY MOUTH DAILY   Misc Natural Products (TART CHERRY ADVANCED PO) Take 2,400 mg by mouth daily.   montelukast (SINGULAIR) 10 MG tablet TAKE 1 TABLET(10 MG) BY MOUTH AT BEDTIME   Multiple Vitamins-Minerals (MULTIVITAMIN WITH MINERALS) tablet Take 1 tablet by mouth daily.   Multiple Vitamins-Minerals (PRESERVISION AREDS 2 PO) Take by mouth.   OMEGA 3 1000 MG CAPS Take 1,000 mg by mouth daily.   omeprazole (PRILOSEC) 20 MG capsule TAKE 1 CAPSULE BY MOUTH TWICE DAILY BEFORE A MEAL   psyllium (METAMUCIL) 58.6 % powder Take 1 packet by mouth daily.   rosuvastatin (CRESTOR) 5 MG tablet Take 1 tablet (5 mg total) by mouth daily.   [DISCONTINUED] benzonatate (TESSALON PERLES) 100 MG capsule Take 1 capsule (100 mg total) by mouth 3 (three) times daily as needed for cough.   [DISCONTINUED] HYDROcodone bit-homatropine (HYCODAN) 5-1.5 MG/5ML syrup Take 5 mLs by mouth every 12 (twelve) hours as needed for up to 10 days for cough.   [DISCONTINUED]  predniSONE (DELTASONE) 20 MG tablet 2 tabs for 3 days then 1 tab for 3 days and 1/2 tab for 3 days.   Current Facility-Administered Medications for the 06/29/21 encounter (Office Visit) with Caren Macadam, MD  Medication   albuterol (PROVENTIL) (2.5 MG/3ML) 0.083% nebulizer solution 2.5 mg    Review of Systems  Constitutional:  Negative for chills, fatigue and fever.  HENT:  Negative for congestion.   Respiratory:  Positive for cough (significantly improved). Negative for chest tightness,  shortness of breath and wheezing.   Cardiovascular:  Negative for chest pain, palpitations and leg swelling.  Gastrointestinal:        Acid reflux - see hpi   Objective:  BP (!) 100/56 (BP Location: Left Arm, Patient Position: Sitting, Cuff Size: Normal)    Pulse 65    Temp 98.2 F (36.8 C) (Oral)    Ht 4\' 8"  (1.422 m)    Wt 116 lb 3.2 oz (52.7 kg)    SpO2 96%    BMI 26.05 kg/m   Weight: 116 lb 3.2 oz (52.7 kg)   BP Readings from Last 3 Encounters:  06/29/21 (!) 100/56  06/19/21 118/70  05/13/21 116/66   Wt Readings from Last 3 Encounters:  06/29/21 116 lb 3.2 oz (52.7 kg)  06/19/21 118 lb 2 oz (53.6 kg)  05/13/21 118 lb (53.5 kg)    Physical Exam Constitutional:      General: She is not in acute distress.    Appearance: She is well-developed.  Cardiovascular:     Rate and Rhythm: Normal rate and regular rhythm.     Heart sounds: Normal heart sounds. No murmur heard.   No friction rub.  Pulmonary:     Effort: Pulmonary effort is normal. No respiratory distress.     Breath sounds: Normal breath sounds. No wheezing or rales.  Musculoskeletal:     Right lower leg: No edema.     Left lower leg: No edema.  Neurological:     Mental Status: She is alert and oriented to person, place, and time.  Psychiatric:        Behavior: Behavior normal.    Assessment/Plan 1. Hyperlipidemia, unspecified hyperlipidemia type She started crestor in the fall;we will recheck to see where lipids are now. We discussed strict ldl control for stabilization of atherosclerosis (discussed calcifications seen on cxr).  - Lipid panel; Future - Comprehensive metabolic panel; Future - Comprehensive metabolic panel - Lipid panel  2. Mild intermittent asthma, unspecified whether complicated Stable.continue with increased symbicort x 1 week, then can return to regular dosing.   3. Gastroesophageal reflux disease, unspecified whether esophagitis present She hasdone well with omeprazole for years. Has  issues with coming down to single dose daily- recurrence of sx. Looks like pepcid is not a "step" therapy, so she is willing to try this. She will let me know how she does with this. If sx are not controlled, we can try to get prior auth for omeprazole,but I also asked her to check cash pay cost at pharmacy as this may end up being cheaper.   Return if symptoms worsen or fail to improve. 30 minute spent in chart review, time with patient, discussion of changes in med, ldl goals, exam, charting.   Micheline Rough, MD

## 2021-07-06 ENCOUNTER — Ambulatory Visit (INDEPENDENT_AMBULATORY_CARE_PROVIDER_SITE_OTHER): Payer: Medicare PPO

## 2021-07-06 VITALS — Ht <= 58 in | Wt 114.0 lb

## 2021-07-06 DIAGNOSIS — Z0000 Encounter for general adult medical examination without abnormal findings: Secondary | ICD-10-CM | POA: Diagnosis not present

## 2021-07-06 NOTE — Patient Instructions (Addendum)
Courtney [name] , Thank you for taking time to come for your Medicare Wellness Visit. I appreciate your ongoing commitment to your health goals. Please review the following plan we discussed and let me know if I can assist you in the future.   These are the goals we discussed:  Goals       Patient Stated (pt-stated)      I will continue to walk daily.        This is a list of the screening recommended for you and due dates:  Health Maintenance  Topic Date Due   COVID-19 Vaccine (4 - Booster for Pfizer series) 07/15/2021*   Tetanus Vaccine  02/17/2022   Pneumonia Vaccine  Completed   Flu Shot  Completed   DEXA scan (bone density measurement)  Completed   Hepatitis C Screening: USPSTF Recommendation to screen - Ages 46-79 yo.  Completed   Zoster (Shingles) Vaccine  Completed   HPV Vaccine  Aged Out   Colon Cancer Screening  Discontinued  *Topic was postponed. The date shown is not the original due date.      Advanced directives: Yes Patient will bring copy  Conditions/risks identified: None  Next appointment: Follow up in one year for your annual wellness visit    Preventive Care 65 Years and Older, Female Preventive care refers to lifestyle choices and visits with your health care provider that can promote health and wellness. What does preventive care include? A yearly physical exam. This is also called an annual well check. Dental exams once or twice a year. Routine eye exams. Ask your health care provider how often you should have your eyes checked. Personal lifestyle choices, including: Daily care of your teeth and gums. Regular physical activity. Eating a healthy diet. Avoiding tobacco and drug use. Limiting alcohol use. Practicing safe sex. Taking low-dose aspirin every day. Taking vitamin and mineral supplements as recommended by your health care provider. What happens during an annual well check? The services and screenings done by your health care provider during  your annual well check will depend on your age, overall health, lifestyle risk factors, and family history of disease. Counseling  Your health care provider may ask you questions about your: Alcohol use. Tobacco use. Drug use. Emotional well-being. Home and relationship well-being. Sexual activity. Eating habits. History of falls. Memory and ability to understand (cognition). Work and work Statistician. Reproductive health. Screening  You may have the following tests or measurements: Height, weight, and BMI. Blood pressure. Lipid and cholesterol levels. These may be checked every 5 years, or more frequently if you are over 65 years old. Skin check. Lung cancer screening. You may have this screening every year starting at age 56 if you have a 30-pack-year history of smoking and currently smoke or have quit within the past 15 years. Fecal occult blood test (FOBT) of the stool. You may have this test every year starting at age 47. Flexible sigmoidoscopy or colonoscopy. You may have a sigmoidoscopy every 5 years or a colonoscopy every 10 years starting at age 56. Hepatitis C blood test. Hepatitis B blood test. Sexually transmitted disease (STD) testing. Diabetes screening. This is done by checking your blood sugar (glucose) after you have not eaten for a while (fasting). You may have this done every 1-3 years. Bone density scan. This is done to screen for osteoporosis. You may have this done starting at age 66. Mammogram. This may be done every 1-2 years. Talk to your health care provider about  how often you should have regular mammograms. Talk with your health care provider about your test results, treatment options, and if necessary, the need for more tests. Vaccines  Your health care provider may recommend certain vaccines, such as: Influenza vaccine. This is recommended every year. Tetanus, diphtheria, and acellular pertussis (Tdap, Td) vaccine. You may need a Td booster every 10  years. Zoster vaccine. You may need this after age 66. Pneumococcal 13-valent conjugate (PCV13) vaccine. One dose is recommended after age 16. Pneumococcal polysaccharide (PPSV23) vaccine. One dose is recommended after age 61. Talk to your health care provider about which screenings and vaccines you need and how often you need them. This information is not intended to replace advice given to you by your health care provider. Make sure you discuss any questions you have with your health care provider. Document Released: 05/16/2015 Document Revised: 01/07/2016 Document Reviewed: 02/18/2015 Elsevier Interactive Patient Education  2017 [employer] Prevention in the Home Falls can cause injuries. They can happen to people of all ages. There are many things you can do to make your home safe and to help prevent falls. What can I do on the outside of my home? Regularly fix the edges of walkways and driveways and fix any cracks. Remove anything that might make you trip as you walk through a door, such as a raised step or threshold. Trim any bushes or trees on the path to your home. Use bright outdoor lighting. Clear any walking paths of anything that might make someone trip, such as rocks or tools. Regularly check to see if handrails are loose or broken. Make sure that both sides of any steps have handrails. Any raised decks and porches should have guardrails on the edges. Have any leaves, snow, or ice cleared regularly. Use sand or salt on walking paths during winter. Clean up any spills in your garage right away. This includes oil or grease spills. What can I do in the bathroom? Use night lights. Install grab bars by the toilet and in the tub and shower. Do not use towel bars as grab bars. Use non-skid mats or decals in the tub or shower. If you need to sit down in the shower, use a plastic, non-slip stool. Keep the floor dry. Clean up any water that spills on the floor as soon as it  happens. Remove soap buildup in the tub or shower regularly. Attach bath mats securely with double-sided non-slip rug tape. Do not have throw rugs and other things on the floor that can make you trip. What can I do in the bedroom? Use night lights. Make sure that you have a light by your bed that is easy to reach. Do not use any sheets or blankets that are too big for your bed. They should not hang down onto the floor. Have a firm chair that has side arms. You can use this for support while you get dressed. Do not have throw rugs and other things on the floor that can make you trip. What can I do in the kitchen? Clean up any spills right away. Avoid walking on wet floors. Keep items that you use a lot in easy-to-reach places. If you need to reach something above you, use a strong step stool that has a grab bar. Keep electrical cords out of the way. Do not use floor polish or wax that makes floors slippery. If you must use wax, use non-skid floor wax. Do not have throw rugs and other  things on the floor that can make you trip. What can I do with my stairs? Do not leave any items on the stairs. Make sure that there are handrails on both sides of the stairs and use them. Fix handrails that are broken or loose. Make sure that handrails are as long as the stairways. Check any carpeting to make sure that it is firmly attached to the stairs. Fix any carpet that is loose or worn. Avoid having throw rugs at the top or bottom of the stairs. If you do have throw rugs, attach them to the floor with carpet tape. Make sure that you have a light switch at the top of the stairs and the bottom of the stairs. If you do not have them, ask someone to add them for you. What else can I do to help prevent falls? Wear shoes that: Do not have high heels. Have rubber bottoms. Are comfortable and fit you well. Are closed at the toe. Do not wear sandals. If you use a stepladder: Make sure that it is fully opened.  Do not climb a closed stepladder. Make sure that both sides of the stepladder are locked into place. Ask someone to hold it for you, if possible. Clearly mark and make sure that you can see: Any grab bars or handrails. First and last steps. Where the edge of each step is. Use tools that help you move around (mobility aids) if they are needed. These include: Canes. Walkers. Scooters. Crutches. Turn on the lights when you go into a dark area. Replace any light bulbs as soon as they burn out. Set up your furniture so you have a clear path. Avoid moving your furniture around. If any of your floors are uneven, fix them. If there are any pets around you, be aware of where they are. Review your medicines with your doctor. Some medicines can make you feel dizzy. This can increase your chance of falling. Ask your doctor what other things that you can do to help prevent falls. This information is not intended to replace advice given to you by your health care provider. Make sure you discuss any questions you have with your health care provider. Document Released: 02/13/2009 Document Revised: 09/25/2015 Document Reviewed: 05/24/2014 Elsevier Interactive Patient Education  2017 [employer].

## 2021-07-06 NOTE — Progress Notes (Signed)
Subjective:   Courtney [name] is a 76 y.o. female who presents for Medicare Annual (Subsequent) preventive examination.  Review of Systems    Virtual Visit via Telephone Note  I connected with  Courtney [name] on 07/06/21 at  9:00 AM EST by telephone and verified that I am speaking with the correct person using two identifiers.  Location: Patient: Home Provider: Office Persons participating in the virtual visit: patient/Nurse Health Advisor   I discussed the limitations, risks, security and privacy concerns of performing an evaluation and management service by telephone and the availability of in person appointments. The patient expressed understanding and agreed to proceed.  Interactive audio and video telecommunications were attempted between this nurse and patient, however failed, due to patient having technical difficulties OR patient did not have access to video capability.  We continued and completed visit with audio only.  Some vital signs may be absent or patient reported.   Criselda Peaches, LPN  Cardiac Risk Factors include: advanced age (>65mn, >>26women);hypertension     Objective:    Today's Vitals   07/06/21 0904  Weight: 114 lb (51.7 kg)  Height: '4\' 8"'$  (1.422 m)   Body mass index is 25.56 kg/m.  Advanced Directives 07/06/2021 06/25/2020 02/01/2020 05/23/2017 08/23/2016 12/29/2015 03/18/2015  Does Patient Have a Medical Advance Directive? Yes Yes No Yes Yes Yes Yes  Type of [place]amed[place]of [place] will [place] - [place] Living will - Living will  Does patient want to make changes to medical advance directive? No - Patient declined No - Patient declined - - - - No - Patient declined  Copy of [place] Chart? No - copy requested No - copy requested - No - copy requested - - No - copy requested  Would patient like information on creating a medical advance  directive? - - No - Patient declined - - - -    Current Medications (verified) Outpatient Encounter Medications as of 07/06/2021  Medication Sig   albuterol (PROVENTIL) (2.5 MG/3ML) 0.083% nebulizer solution Take 3 mLs (2.5 mg total) by nebulization every 6 (six) hours as needed for wheezing or shortness of breath.   albuterol (VENTOLIN HFA) 108 (90 Base) MCG/ACT inhaler Inhale 2 puffs into the lungs every 6 (six) hours as needed for wheezing or shortness of breath.   amLODipine (NORVASC) 5 MG tablet TAKE 1 TABLET(5 MG) BY MOUTH DAILY   aspirin 81 MG tablet Take 81 mg by mouth daily.   budesonide-formoterol (SYMBICORT) 160-4.5 MCG/ACT inhaler INHALE 1 TO 2 PUFFS EVERY 12 HOURS AS NEEDED. GARGLE AND SPIT AFTER USE   calcium citrate-vitamin D (CITRACAL+D) 315-200 MG-UNIT per tablet Take 1 tablet by mouth daily.   Carboxymethylcellulose Sod PF (RETAINE CMC) 0.5 % SOLN Apply to eye.   Cholecalciferol (VITAMIN D3) 1000 UNITS CAPS Take by mouth daily.   co-enzyme Q-10 30 MG capsule Take 50 mg by mouth daily.   famotidine (PEPCID) 40 MG tablet Take 0.5-1 tablets (20-40 mg total) by mouth 2 (two) times daily.   FINACEA 15 % cream Apply 1 application topically 2 (two) times daily. For rosacea   hydrochlorothiazide (MICROZIDE) 12.5 MG capsule TAKE 1 CAPSULE(12.5 MG) BY MOUTH DAILY   ipratropium (ATROVENT) 0.06 % nasal spray Place 2 sprays into both nostrils 4 (four) times daily.   losartan (COZAAR) 100 MG tablet TAKE 1 TABLET(100 MG) BY MOUTH DAILY   Misc Natural Products (TART  CHERRY ADVANCED PO) Take 2,400 mg by mouth daily.   montelukast (SINGULAIR) 10 MG tablet TAKE 1 TABLET(10 MG) BY MOUTH AT BEDTIME   Multiple Vitamins-Minerals (MULTIVITAMIN WITH MINERALS) tablet Take 1 tablet by mouth daily.   Multiple Vitamins-Minerals (PRESERVISION AREDS 2 PO) Take by mouth.   OMEGA 3 1000 MG CAPS Take 1,000 mg by mouth daily.   omeprazole (PRILOSEC) 20 MG capsule TAKE 1 CAPSULE BY MOUTH TWICE DAILY BEFORE A  MEAL   psyllium (METAMUCIL) 58.6 % powder Take 1 packet by mouth daily.   rosuvastatin (CRESTOR) 5 MG tablet Take 1 tablet (5 mg total) by mouth daily.   Facility-Administered Encounter Medications as of 07/06/2021  Medication   albuterol (PROVENTIL) (2.5 MG/3ML) 0.083% nebulizer solution 2.5 mg    Allergies (verified) Patient has no known allergies.   History: Past Medical History:  Diagnosis Date   Breast cancer [facility]) July 2012   post double mastectomy; Dr Humphrey Rolls   Diverticulosis    Family history of breast cancer    GERD (gastroesophageal reflux disease)    S/P dilation X 1   Hemorrhoids    Hypertension    Night sweats    OSA (obstructive sleep apnea) 08/03/2017   Very mild with AHI 7.2/hr.     Prolapsed internal hemorrhoids, grade 3 06/27/2017   Grade 2 on anoscopy exam but grade 3 by history and seen a colonoscopy as well All 3 columns banded 06/27/2017  09/21/2017 banded RP and RA     Reactive airway disease    Past Surgical History:  Procedure Laterality Date   ABDOMINAL HYSTERECTOMY     bilateral breast reconstruction  Jan 2013   BREAST SURGERY  01/13/11   bilateral total mastectomy; Dr Dalbert Batman   COLONOSCOPY  2014   Dr Olevia Perches; hyperplastic polyp   ESOPHAGEAL DILATION  2014   HEMORRHOID BANDING  2019   February and May   NASAL SEPTUM SURGERY     RECONSTRUCTION / [place] / Tresa Moore  June 2013   THYROGLOSSAL DUCT CYST     Family History  Problem Relation Age of Onset   Breast cancer Mother 73   Colon cancer Mother 35   Cervical cancer Mother 5       s/p hysterectomy   Heart attack Father 74   Diabetes Father        probable pre DM   Skin cancer Father        maybe melanoma   Cancer Sister 2       appendiceal   Heart attack Maternal Grandfather 57   Heart attack Paternal Uncle        X2 ; >50y   Heart attack Maternal Uncle        d. 55   Parkinson's disease Maternal Grandmother        d. 26   Heart attack Paternal Grandfather    Breast cancer  Other        unspecified age; maternal great aunt (MGM's sister)   Breast cancer Cousin        maternal 1st cousin, once-removed; unspecified age   Heart attack Paternal Uncle        >50y   Stroke Neg Hx    Social History   Socioeconomic History   Marital status: Married    Spouse name: Not on file   Number of children: Not on file   Years of education: Not on file   Highest education level: Bachelor's degree (e.g., BA, AB, BS)  Occupational History   Not on file  Tobacco Use   Smoking status: Former    Years: 5.00    Types: Cigarettes    Quit date: 05/03/1965    Years since quitting: 56.2   Smokeless tobacco: Never   Tobacco comments:    smoked 1962-1967, less than 1 ppd  Vaping Use   Vaping Use: Never used  Substance and Sexual Activity   Alcohol use: Yes    Alcohol/week: 14.0 standard drinks    Types: 14 Glasses of wine per week    Comment: 2-3 /day   Drug use: No   Sexual activity: Yes  Other Topics Concern   Not on file  Social History Narrative   Married   2-3 glasses wine/day   remote small vol smmoker   Social Determinants of Health   Financial Resource Strain: Low Risk    Difficulty of Paying Living Expenses: Not hard at all  Food Insecurity: No Food Insecurity   Worried About Charity fundraiser in the Last Year: Never true   Arboriculturist in the Last Year: Never true  Transportation Needs: No Transportation Needs   Lack of Transportation (Medical): No   Lack of Transportation (Non-Medical): No  Physical Activity: Sufficiently Active   Days of Exercise per Week: 6 days   Minutes of Exercise per Session: 60 min  Stress: No Stress Concern Present   Feeling of Stress : Not at all  Social Connections: Socially Integrated   Frequency of Communication with Friends and Family: More than three times a week   Frequency of Social Gatherings with Friends and Family: More than three times a week   Attends Religious Services: More than 4 times per year    Active Member of [employer] or Organizations: Yes   Attends Music therapist: More than 4 times per year   Marital Status: Married    Tobacco Counseling Counseling given: Not Answered Tobacco comments: smoked [phone], less than 1 ppd   Clinical Intake:  Pre-visit preparation completed: YesDiabetic?  No  Interpreter Needed?: NoActivities of Daily Living In your present state of health, do you have any difficulty performing the following activities: 07/06/2021  Hearing? N  Vision? N  Difficulty concentrating or making decisions? N  Walking or climbing stairs? N  Dressing or bathing? N  Doing errands, shopping? N  Preparing Food and eating ? N  Using the Toilet? N  In the past six months, have you accidently leaked urine? Y  Comment Patient leaks urine when coughing  Do you have problems with loss of bowel control? N  Managing your Medications? N  Managing your Finances? N  Housekeeping or managing your Housekeeping? N  Some recent data might be hidden    Patient Care Team: Caren Macadam, MD as PCP - General (Family Medicine) Magrinat, Virgie Dad, MD (Inactive) as Consulting Physician (Oncology) Delice Bison, Charlestine Massed, NP as Nurse Practitioner (Hematology and Oncology) Rolm Bookbinder, MD as Consulting Physician (Dermatology)  Indicate any recent Medical Services you may have received from other than [facility] providers in the past year (date may be approximate).     Assessment:   This is a routine wellness examination for Courtney [name].  Hearing/Vision screen Hearing Screening - Comments:: No difficulty hearing Vision Screening - Comments:: Wears one contact for reading. Followed by Dr Marigene Ehlers  Dietary issues and exercise activities discussed: Exercise limited by: None identified   Goals Addressed  This Visit's Progress     Patient Stated (pt-stated)        I will continue to walk daily.       Depression Screen PHQ 2/9 Scores 07/06/2021  03/16/2021 06/25/2020 02/18/2020 01/29/2019 01/10/2018 01/10/2017  PHQ - 2 Score 0 0 0 0 0 0 0  PHQ- 9 Score - 3 - - - - -    Fall Risk Fall Risk  07/06/2021 06/18/2021 03/16/2021 06/25/2020 02/18/2020  Falls in the past year? 0 0 0 0 0  Number falls in past yr: 0 - 0 0 0  Injury with Fall? 0 - - 0 0  Risk for fall due to : No Fall Risks - - No Fall Risks -  Follow up - - - Falls evaluation completed;Falls prevention discussed -    FALL RISK PREVENTION PERTAINING TO THE HOME:  Any stairs in or around the home? Yes  If so, are there any without handrails? No  Home free of loose throw rugs in walkways, pet beds, electrical cords, etc? Yes  Adequate lighting in your home to reduce risk of falls? Yes   ASSISTIVE DEVICES UTILIZED TO PREVENT FALLS:  Life alert? No  Use of a cane, walker or w/c? No  Grab bars in the bathroom? No  Shower chair or bench in shower? No  Elevated toilet seat or a handicapped toilet? No   TIMED UP AND GO:  Was the test performed? No . Audio Visit  Cognitive Function:     Immunizations Immunization History  Administered Date(s) Administered   Fluad Quad(high Dose 65+) 01/29/2019, 02/23/2021   Influenza Split 02/18/2012   Influenza, High Dose Seasonal PF 02/16/2013, 03/06/2015, 01/06/2016, 01/10/2017, 03/07/2018   Influenza,inj,Quad PF,6+ Mos 02/19/2014   Influenza-Unspecified 12/31/2015, 01/29/2019, 01/24/2020   PFIZER(Purple Top)SARS-COV-2 Vaccination 05/24/2019, 06/14/2019, 01/24/2020   PNEUMOCOCCAL CONJUGATE-20 03/16/2021   PPD Test 04/17/2012   Pneumococcal Conjugate-13 01/03/2015   Pneumococcal Polysaccharide-23 05/03/2008, 02/22/2013   Tdap 02/18/2012   Zoster Recombinat (Shingrix) 03/21/2017, 03/16/2018, 03/16/2018   Zoster, Live 01/14/2010    TDAP status: Up to date  Flu Vaccine status: Up to date  Pneumococcal vaccine status: Up to date  Covid-19 vaccine status: Completed vaccines  Qualifies for Shingles Vaccine? Yes   Zostavax  completed Yes   Shingrix Completed?: Yes  Screening Tests Health Maintenance  Topic Date Due   COVID-19 Vaccine (4 - Booster for Pfizer series) 07/15/2021 (Originally 03/20/2020)   TETANUS/TDAP  02/17/2022   Pneumonia Vaccine 53+ Years old  Completed   INFLUENZA VACCINE  Completed   DEXA SCAN  Completed   Hepatitis C Screening  Completed   Zoster Vaccines- Shingrix  Completed   HPV VACCINES  Aged Out   COLONOSCOPY (Pts 45-33yr Insurance coverage will need to be confirmed)  Discontinued    Health Maintenance  There are no preventive care reminders to display for this patient.  Colorectal cancer screening: Type of screening: Colonoscopy. Completed 04/08/17. Repeat every 0 years    Bone Density status: Completed 05/07/21. Results reflect: Bone density results: OSTEOPOROSIS. Repeat every 2 years.  Lung Cancer Screening: (Low Dose CT Chest recommended if Age 78-80years, 30 pack-year currently smoking OR have quit w/in 15years.) does not qualify.     Additional Screening:  Hepatitis C Screening: does qualify; Completed 12/10/15  Vision Screening: Recommended annual ophthalmology exams for early detection of glaucoma and other disorders of the eye. Is the patient up to date with their annual eye exam?  Yes  Who is the  provider or what is the name of the office in which the patient attends annual eye exams? Dr Marigene Ehlers If pt is not established with a provider, would they like to be referred to a provider to establish care? No .   Dental Screening: Recommended annual dental exams for proper oral hygiene  Community Resource Referral / Chronic Care Management:  CRR required this visit?  No   CCM required this visit?  No      Plan:     I have personally reviewed and noted the following in the patients chart:   Medical and social history Use of alcohol, tobacco or illicit drugs  Current medications and supplements including opioid prescriptions.  Functional ability and  status Nutritional status Physical activity Advanced directives List of other physicians Hospitalizations, surgeries, and ER visits in previous 12 months Vitals Screenings to include cognitive, depression, and falls Referrals and appointments  In addition, I have reviewed and discussed with patient certain preventive protocols, quality metrics, and best practice recommendations. A written personalized care plan for preventive services as well as general preventive health recommendations were provided to patient.     Criselda Peaches, LPN   05/08/1094   Nurse Notes: None

## 2021-07-30 DIAGNOSIS — Z853 Personal history of malignant neoplasm of breast: Secondary | ICD-10-CM | POA: Diagnosis not present

## 2021-07-30 DIAGNOSIS — Z6823 Body mass index (BMI) 23.0-23.9, adult: Secondary | ICD-10-CM | POA: Diagnosis not present

## 2021-07-30 DIAGNOSIS — Z01419 Encounter for gynecological examination (general) (routine) without abnormal findings: Secondary | ICD-10-CM | POA: Diagnosis not present

## 2021-08-18 ENCOUNTER — Encounter: Payer: Self-pay | Admitting: Family Medicine

## 2021-08-19 ENCOUNTER — Other Ambulatory Visit: Payer: Self-pay | Admitting: Family Medicine

## 2021-08-19 DIAGNOSIS — Z85828 Personal history of other malignant neoplasm of skin: Secondary | ICD-10-CM | POA: Diagnosis not present

## 2021-08-19 DIAGNOSIS — L570 Actinic keratosis: Secondary | ICD-10-CM | POA: Diagnosis not present

## 2021-08-24 NOTE — Telephone Encounter (Signed)
Message forwarded to PCP as there is nothing I can do online as the insurance denied the medication. ?

## 2021-08-27 NOTE — Telephone Encounter (Signed)
I called [employer], spoke with Sunday Spillers [phone]) and she stated to fax the letter to [place] review at [phone].  Letter was faxed as below.  ?

## 2021-09-05 ENCOUNTER — Other Ambulatory Visit: Payer: Self-pay | Admitting: Family Medicine

## 2021-11-16 NOTE — Progress Notes (Signed)
HPI: [name] is a 76 y.o. female, who is here today to establish care.  Former PCP: Dr. Ethlyn [name] Last preventive routine visit: 03/16/21  Chronic medical problems: OSA,osteopenia,rosacea,asthma,chronic cough,GERD,HTN,OA, and breast cancer among some.  Hypertension:  Medications: Losartan 100 mg daily, HCTZ 12.5 mg daily, and amlodipine 5 mg daily. Side effects: None. Negative for unusual or severe headache, visual changes, exertional chest pain, dyspnea,  focal weakness, or edema.  Lab Results  Component Value Date   CREATININE 0.83 06/29/2021   BUN 25 (H) 06/29/2021   NA 142 06/29/2021   K 3.7 06/29/2021   CL 103 06/29/2021   CO2 32 06/29/2021   Hyperlipidemia: Currently on rosuvastatin 5 mg daily. She has tolerated medication well. Lab Results  Component Value Date   CHOL 191 06/29/2021   HDL 88.70 06/29/2021   LDLCALC 81 06/29/2021   LDLDIRECT 131.3 02/29/2012   TRIG 105.0 06/29/2021   CHOLHDL 2 06/29/2021   Prediabetes: Negative for polydipsia, polyuria, polyphagia. Lab Results  Component Value Date   HGBA1C 6.2 03/16/2021   Today she is complaining about cough, which is chronic. Nonproductive cough. No associated wheezing. It seems to be exacerbated by eating, sometimes she has some difficulty swallowing, mainly vitamins and sometimes solid foods.  This has been going on for years and reported as stable. She has EGD in 05/2012, when mild distal esophageal stricture was found and underwent esophageal dilation.  She drinks wine, about 3 glasses daily.  Currently she is on omeprazole 20 mg daily, she has been taking medication for years. Asthma has been well controlled, she has exacerbations associated with acute URI. Currently she is on Symbicort 160-4.5 mcg twice daily as needed and Singulair 10 mg daily..  Review of Systems  Constitutional:  Negative for activity change, appetite change and fever.  HENT:  Negative for mouth sores, nosebleeds  and sore throat.   Gastrointestinal:  Negative for abdominal pain, nausea and vomiting.       Negative for changes in bowel habits.  Genitourinary:  Negative for decreased urine volume and hematuria.  Neurological:  Negative for syncope, facial asymmetry and weakness.  Rest see pertinent positives and negatives per HPI.  Current Outpatient Medications on File Prior to Visit  Medication Sig Dispense Refill   albuterol (PROVENTIL) (2.5 MG/3ML) 0.083% nebulizer solution Take 3 mLs (2.5 mg total) by nebulization every 6 (six) hours as needed for wheezing or shortness of breath. 150 mL 1   albuterol (VENTOLIN HFA) 108 (90 Base) MCG/ACT inhaler Inhale 2 puffs into the lungs every 6 (six) hours as needed for wheezing or shortness of breath. 8 g 0   aspirin 81 MG tablet Take 81 mg by mouth daily.     budesonide-formoterol (SYMBICORT) 160-4.5 MCG/ACT inhaler INHALE 1 TO 2 PUFFS EVERY 12 HOURS AS NEEDED. GARGLE AND SPIT AFTER USE 10.2 g 11   calcium citrate-vitamin D (CITRACAL+D) 315-200 MG-UNIT per tablet Take 1 tablet by mouth daily.     Carboxymethylcellulose Sod PF (RETAINE CMC) 0.5 % SOLN Apply to eye.     Cholecalciferol (VITAMIN D3) 1000 UNITS CAPS Take by mouth daily.     co-enzyme Q-10 30 MG capsule Take 50 mg by mouth daily.     FINACEA 15 % cream Apply 1 application topically 2 (two) times daily. For rosacea  0   ipratropium (ATROVENT) 0.06 % nasal spray Place 2 sprays into both nostrils 4 (four) times daily. 15 mL 3   Misc Natural Products (TART  CHERRY ADVANCED PO) Take 2,400 mg by mouth daily.     montelukast (SINGULAIR) 10 MG tablet TAKE 1 TABLET(10 MG) BY MOUTH AT BEDTIME 90 tablet 3   Multiple Vitamins-Minerals (MULTIVITAMIN WITH MINERALS) tablet Take 1 tablet by mouth daily.     OMEGA 3 1000 MG CAPS Take 1,000 mg by mouth daily.     psyllium (METAMUCIL) 58.6 % powder Take 1 packet by mouth daily.     rosuvastatin (CRESTOR) 5 MG tablet TAKE 1 TABLET(5 MG) BY MOUTH DAILY 90 tablet 1    Current Facility-Administered Medications on File Prior to Visit  Medication Dose Route Frequency Provider Last Rate Last Admin   albuterol (PROVENTIL) (2.5 MG/3ML) 0.083% nebulizer solution 2.5 mg  2.5 mg Nebulization Once Dorena Cookey, MD       Past Medical History:  Diagnosis Date   Breast cancer [facility]) July 2012   post double mastectomy; Dr Humphrey Rolls   Diverticulosis    Family history of breast cancer    GERD (gastroesophageal reflux disease)    S/P dilation X 1   Hemorrhoids    Hypertension    Night sweats    OSA (obstructive sleep apnea) 08/03/2017   Very mild with AHI 7.2/hr.     Prolapsed internal hemorrhoids, grade 3 06/27/2017   Grade 2 on anoscopy exam but grade 3 by history and seen a colonoscopy as well All 3 columns banded 06/27/2017  09/21/2017 banded RP and RA     Reactive airway disease    No Known Allergies  Family History  Problem Relation Age of Onset   Breast cancer Mother 4   Colon cancer Mother 33   Cervical cancer Mother 36       s/p hysterectomy   Heart attack Father 51   Diabetes Father        probable pre DM   Skin cancer Father        maybe melanoma   Cancer Sister 63       appendiceal   Heart attack Maternal Grandfather 57   Heart attack Paternal Uncle        X20 ; >50y   Heart attack Maternal Uncle        d. 13   Parkinson's disease Maternal Grandmother        d. 87   Heart attack Paternal Grandfather    Breast cancer Other        unspecified age; maternal great aunt (MGM's sister)   Breast cancer Cousin        maternal 1st cousin, once-removed; unspecified age   Heart attack Paternal Uncle        >50y   Stroke Neg Hx    Social History   Socioeconomic History   Marital status: Married    Spouse name: Not on file   Number of children: Not on file   Years of education: Not on file   Highest education level: Bachelor's degree (e.g., BA, AB, BS)  Occupational History   Not on file  Tobacco Use   Smoking status: Former    Years:  5.00    Types: Cigarettes    Quit date: 05/03/1965    Years since quitting: 56.5   Smokeless tobacco: Never   Tobacco comments:    smoked 1962-1967, less than 1 ppd  Vaping Use   Vaping Use: Never used  Substance and Sexual Activity   Alcohol use: Yes    Alcohol/week: 14.0 standard drinks of alcohol    Types: 14  Glasses of wine per week    Comment: 2-3 /day   Drug use: No   Sexual activity: Yes  Other Topics Concern   Not on file  Social History Narrative   Married   2-3 glasses wine/day   remote small vol smmoker   Social Determinants of Health   Financial Resource Strain: Low Risk  (07/06/2021)   Overall Financial Resource Strain (CARDIA)    Difficulty of Paying Living Expenses: Not hard at all  Food Insecurity: No Food Insecurity (07/06/2021)   Hunger Vital Sign    Worried About Running Out of Food in the Last Year: Never true    Ran Out of Food in the Last Year: Never true  Transportation Needs: No Transportation Needs (07/06/2021)   PRAPARE - Hydrologist (Medical): No    Lack of Transportation (Non-Medical): No  Physical Activity: Sufficiently Active (07/06/2021)   Exercise Vital Sign    Days of Exercise per Week: 6 days    Minutes of Exercise per Session: 60 min  Stress: No Stress Concern Present (07/06/2021)   [employer]ry    Feeling of Stress : Not at all  Social Connections: [place] (07/06/2021)   Social Connection and Isolation Panel [NHANES]    Frequency of Communication with Friends and Family: More than three times a week    Frequency of Social Gatherings with Friends and Family: More than three times a week    Attends Religious Services: More than 4 times per year    Active Member of [employer] or Organizations: Yes    Attends Archivist Meetings: More than 4 times per year    Marital Status: Married   Vitals:   11/17/21 0959  BP: 102/70  Pulse: 70   Resp: 16  SpO2: 97%  Body mass index is 25.67 kg/m.  Physical Exam Vitals and nursing note reviewed.  Constitutional:      General: She is not in acute distress.    Appearance: She is well-developed.  HENT:     Head: Normocephalic and atraumatic.     Mouth/Throat:     Mouth: Mucous membranes are moist.     Pharynx: Oropharynx is clear.  Eyes:     Conjunctiva/sclera: Conjunctivae normal.  Cardiovascular:     Rate and Rhythm: Normal rate and regular rhythm.     Pulses:          Dorsalis pedis pulses are 2+ on the right side and 2+ on the left side.     Heart sounds: No murmur heard.    Comments: DP pulses present. Varicose veins LE,bilateral. Pulmonary:     Effort: Pulmonary effort is normal. No respiratory distress.     Breath sounds: Normal breath sounds.  Abdominal:     Palpations: Abdomen is soft. There is no hepatomegaly or mass.     Tenderness: There is no abdominal tenderness.  Lymphadenopathy:     Cervical: No cervical adenopathy.  Skin:    General: Skin is warm.     Findings: No erythema or rash.  Neurological:     General: No focal deficit present.     Mental Status: She is alert and oriented to person, place, and time.     Cranial Nerves: No cranial nerve deficit.     Gait: Gait normal.  Psychiatric:     Comments: Well groomed, good eye contact.   ASSESSMENT AND PLAN:  [name] was seen today for  establish care.  Diagnoses and all orders for this visit: Orders Placed This Encounter  Procedures   Basic metabolic panel   Hemoglobin A1c   Lab Results  Component Value Date   CREATININE 0.68 11/17/2021   BUN 17 11/17/2021   NA 143 11/17/2021   K 4.1 11/17/2021   CL 104 11/17/2021   CO2 30 11/17/2021   Lab Results  Component Value Date   HGBA1C 6.3 11/17/2021   Prediabetes Consistency with a healthy lifestyle recommended for diabetes prevention. Further recommendation will be given according to hemoglobin A1c result.  Gastroesophageal reflux  disease, unspecified whether esophagitis present This problem could be causing her chronic cough. Recommend stopping omeprazole and try pantoprazole 40 mg daily, 30 minutes before breakfast. GERD precautions recommended, including decreasing alcohol intake.  -     pantoprazole (PROTONIX) 40 MG tablet; Take 1 tablet (40 mg total) by mouth daily.  Chronic cough Problem seems to be stable. We discussed possible etiologies, some of her chronic comorbidities could be contributing factors. GERD treatment adjusted today.  Dysphagia, unspecified type Chronic and stable. If not improved with Protonix, she may need another EGD. Instructed about warning signs.  Primary hypertension BP adequately controlled. Continue losartan, HCTZ, and amlodipine same dose. Low-salt diet also recommended.  -     losartan (COZAAR) 100 MG tablet; TAKE 1 TABLET(100 MG) BY MOUTH DAILY -     hydrochlorothiazide (MICROZIDE) 12.5 MG capsule; TAKE 1 CAPSULE(12.5 MG) BY MOUTH DAILY -     amLODipine (NORVASC) 5 MG tablet; TAKE 1 TABLET(5 MG) BY MOUTH DAILY  Mild intermittent asthma, unspecified whether complicated Problem is well controlled. Continue Symbicort 160-4.5 mcg twice daily as needed and albuterol inhaler 1 to 2 puff every 6 hours as needed. Recommend taking Singulair 10 mg at bedtime during seasons she feels affect her allergies the most, taking a couple breaks during the year from medication.  Malignant neoplasm of left breast in female, estrogen receptor positive, unspecified site of breast ([place]) Completed 5 years of Letrozole, follows with oncologist and gyn annually.  Return in about 6 months (around 05/20/2022).  Kazandra Forstrom G. [name], MD  [facility]. [place] office.

## 2021-11-17 ENCOUNTER — Encounter: Payer: Self-pay | Admitting: Family Medicine

## 2021-11-17 ENCOUNTER — Ambulatory Visit: Payer: Medicare PPO | Admitting: Family Medicine

## 2021-11-17 VITALS — BP 102/70 | HR 70 | Resp 16 | Ht <= 58 in | Wt 114.5 lb

## 2021-11-17 DIAGNOSIS — R053 Chronic cough: Secondary | ICD-10-CM | POA: Diagnosis not present

## 2021-11-17 DIAGNOSIS — C50912 Malignant neoplasm of unspecified site of left female breast: Secondary | ICD-10-CM | POA: Diagnosis not present

## 2021-11-17 DIAGNOSIS — R7303 Prediabetes: Secondary | ICD-10-CM | POA: Insufficient documentation

## 2021-11-17 DIAGNOSIS — K219 Gastro-esophageal reflux disease without esophagitis: Secondary | ICD-10-CM

## 2021-11-17 DIAGNOSIS — R1310 Dysphagia, unspecified: Secondary | ICD-10-CM

## 2021-11-17 DIAGNOSIS — Z170 Estrogen receptor positive status [ER+]: Secondary | ICD-10-CM

## 2021-11-17 DIAGNOSIS — I10 Essential (primary) hypertension: Secondary | ICD-10-CM | POA: Diagnosis not present

## 2021-11-17 DIAGNOSIS — J4520 Mild intermittent asthma, uncomplicated: Secondary | ICD-10-CM

## 2021-11-17 LAB — BASIC METABOLIC PANEL
BUN: 17 mg/dL (ref 6–23)
CO2: 30 mEq/L (ref 19–32)
Calcium: 9.9 mg/dL (ref 8.4–10.5)
Chloride: 104 mEq/L (ref 96–112)
Creatinine, Ser: 0.68 mg/dL (ref 0.40–1.20)
GFR: 83.71 mL/min (ref >60.00)
Glucose, Bld: 96 mg/dL (ref 70–99)
Potassium: 4.1 mEq/L (ref 3.5–5.1)
Sodium: 143 mEq/L (ref 135–145)

## 2021-11-17 LAB — HEMOGLOBIN A1C: Hgb A1c MFr Bld: 6.3 % (ref 4.6–6.5)

## 2021-11-17 MED ORDER — LOSARTAN POTASSIUM 100 MG PO TABS: ORAL_TABLET | ORAL | 2 refills | Status: DC

## 2021-11-17 MED ORDER — PANTOPRAZOLE SODIUM 40 MG PO TBEC: 40.0000 mg | DELAYED_RELEASE_TABLET | Freq: Every day | ORAL | 3 refills | Status: DC

## 2021-11-17 MED ORDER — AMLODIPINE BESYLATE 5 MG PO TABS: ORAL_TABLET | ORAL | 2 refills | Status: DC

## 2021-11-17 MED ORDER — HYDROCHLOROTHIAZIDE 12.5 MG PO CAPS: ORAL_CAPSULE | ORAL | 2 refills | Status: DC

## 2021-11-17 NOTE — Patient Instructions (Addendum)
A few things to remember from today's visit:   Prediabetes - Plan: Basic metabolic panel, Hemoglobin A1c  Gastroesophageal reflux disease, unspecified whether esophagitis present - Plan: pantoprazole (PROTONIX) 40 MG tablet  Chronic cough  Dysphagia, unspecified type  Primary hypertension - Plan: losartan (COZAAR) 100 MG tablet, hydrochlorothiazide (MICROZIDE) 12.5 MG capsule, amLODipine (NORVASC) 5 MG tablet  If you need refills please call your pharmacy. Do not use My Chart to request refills or for acute issues that need immediate attention.   Stop Omeprazole and try Pantoprazole 40 mg 30 min before breakfast. You are due for colonoscopy in 04/2022. No changes in rest.  Please be sure medication list is accurate. If a new problem present, please set up appointment sooner than planned today.

## 2021-11-18 ENCOUNTER — Encounter: Payer: Self-pay | Admitting: Family Medicine

## 2021-12-17 DIAGNOSIS — Z85828 Personal history of other malignant neoplasm of skin: Secondary | ICD-10-CM | POA: Diagnosis not present

## 2021-12-17 DIAGNOSIS — L821 Other seborrheic keratosis: Secondary | ICD-10-CM | POA: Diagnosis not present

## 2021-12-17 DIAGNOSIS — L570 Actinic keratosis: Secondary | ICD-10-CM | POA: Diagnosis not present

## 2021-12-17 DIAGNOSIS — D2239 Melanocytic nevi of other parts of face: Secondary | ICD-10-CM | POA: Diagnosis not present

## 2022-01-12 DIAGNOSIS — H2513 Age-related nuclear cataract, bilateral: Secondary | ICD-10-CM | POA: Diagnosis not present

## 2022-01-12 DIAGNOSIS — H353121 Nonexudative age-related macular degeneration, left eye, early dry stage: Secondary | ICD-10-CM | POA: Diagnosis not present

## 2022-01-12 DIAGNOSIS — H5202 Hypermetropia, left eye: Secondary | ICD-10-CM | POA: Diagnosis not present

## 2022-01-21 ENCOUNTER — Telehealth: Payer: Self-pay | Admitting: Family Medicine

## 2022-01-21 NOTE — Telephone Encounter (Signed)
All set. Virtual visit tomorrow at 4:30 pm.

## 2022-01-21 NOTE — Telephone Encounter (Signed)
Pt called to request a refill of a prescription she used in the past called Hydromet for cough.  Pt was offered an OV and said she was just here -  LOV:  11/17/2021 = TOC  Please advise.  [id number] [address], [place] Phone:  [phone]  Fax:  [phone]

## 2022-01-21 NOTE — Telephone Encounter (Signed)
I agree - I offered - She said no. I'll call her back now to let her know - thanks.

## 2022-01-21 NOTE — Telephone Encounter (Signed)
She hasn't been seen since July, it can be a virtual visit & we should be able to see her tomorrow.

## 2022-01-22 ENCOUNTER — Telehealth (INDEPENDENT_AMBULATORY_CARE_PROVIDER_SITE_OTHER): Payer: Medicare PPO | Admitting: Family Medicine

## 2022-01-22 ENCOUNTER — Encounter: Payer: Self-pay | Admitting: Family Medicine

## 2022-01-22 VITALS — Ht <= 58 in

## 2022-01-22 DIAGNOSIS — J069 Acute upper respiratory infection, unspecified: Secondary | ICD-10-CM | POA: Diagnosis not present

## 2022-01-22 DIAGNOSIS — J4521 Mild intermittent asthma with (acute) exacerbation: Secondary | ICD-10-CM

## 2022-01-22 MED ORDER — HYDROCODONE BIT-HOMATROP MBR 5-1.5 MG/5ML PO SOLN: 5.0000 mL | Freq: Two times a day (BID) | ORAL | 0 refills | Status: AC | PRN

## 2022-01-22 MED ORDER — PREDNISONE 20 MG PO TABS: 40.0000 mg | ORAL_TABLET | Freq: Every day | ORAL | 0 refills | Status: AC

## 2022-01-22 MED ORDER — DOXYCYCLINE HYCLATE 100 MG PO TABS: 100.0000 mg | ORAL_TABLET | Freq: Two times a day (BID) | ORAL | 0 refills | Status: AC

## 2022-01-22 MED ORDER — BENZONATATE 100 MG PO CAPS: 100.0000 mg | ORAL_CAPSULE | Freq: Two times a day (BID) | ORAL | 0 refills | Status: AC | PRN

## 2022-01-22 NOTE — Progress Notes (Signed)
Virtual Visit via Video Note I connected with Courtney [name] on 01/22/22 by a video enabled telemedicine application and verified that I am speaking with the correct person using two identifiers.  Location patient: home Location provider:work office Persons participating in the virtual visit: patient, provider  I discussed the limitations of evaluation and management by telemedicine and the availability of in person appointments. The patient expressed understanding and agreed to proceed.  Chief Complaint  Patient presents with   Cough    Bad cough, requesting a refill on her cough syrup.    HPI: Courtney [name] is a 76 year old female with history of GERD, hypertension, asthma, OSA, and prediabetes complaining of 6 days of respiratory symptoms as described above.  She is requesting a refill on Hydromet cough syrup, which she has taken several times throughout the years and helps with cough.  On 01/16/2022 she started with fatigue, rhinorrhea, nasal congestion, sore throat. On 01/18/2022 chest congestion, productive cough with "little" sputum, and wheezing. Denies hemoptysis.  She has not had fever, chills, anosmia,ageusia,CP, dyspnea, abdominal pain, nausea, vomiting, changes in bowel habits, urinary symptoms, or skin rash. Symptoms are stable.  COVID-19 test x2 negative. No sick contact.  Asthma: She been using her Symbicort 160-4.5 mcg twice daily and albuterol inhaler as needed. She is also on Singulair 10 mg daily.  ROS: See pertinent positives and negatives per HPI.  Past Medical History:  Diagnosis Date   Breast cancer [facility]) July 2012   post double mastectomy; Dr Humphrey Rolls   Diverticulosis    Family history of breast cancer    GERD (gastroesophageal reflux disease)    S/P dilation X 1   Hemorrhoids    Hypertension    Night sweats    OSA (obstructive sleep apnea) 08/03/2017   Very mild with AHI 7.2/hr.     Prolapsed internal hemorrhoids, grade 3 06/27/2017   Grade 2 on anoscopy exam but  grade 3 by history and seen a colonoscopy as well All 3 columns banded 06/27/2017  09/21/2017 banded RP and RA     Reactive airway disease    Past Surgical History:  Procedure Laterality Date   ABDOMINAL HYSTERECTOMY     bilateral breast reconstruction  Jan 2013   BREAST SURGERY  01/13/11   bilateral total mastectomy; Dr Dalbert Batman   COLONOSCOPY  2014   Dr Olevia Perches; hyperplastic polyp   ESOPHAGEAL DILATION  2014   HEMORRHOID BANDING  2019   February and May   NASAL SEPTUM SURGERY     RECONSTRUCTION / [place] / Tresa Moore  June 2013   THYROGLOSSAL DUCT CYST     Family History  Problem Relation Age of Onset   Breast cancer Mother 65   Colon cancer Mother 30   Cervical cancer Mother 77       s/p hysterectomy   Heart attack Father 48   Diabetes Father        probable pre DM   Skin cancer Father        maybe melanoma   Cancer Sister 39       appendiceal   Heart attack Maternal Grandfather 57   Heart attack Paternal Uncle        X2 ; >50y   Heart attack Maternal Uncle        d. 42   Parkinson's disease Maternal Grandmother        d. 11   Heart attack Paternal Grandfather    Breast cancer Other  unspecified age; maternal great aunt (MGM's sister)   Breast cancer Cousin        maternal 1st cousin, once-removed; unspecified age   Heart attack Paternal Uncle        >50y   Stroke Neg Hx     Social History   Socioeconomic History   Marital status: Married    Spouse name: Not on file   Number of children: Not on file   Years of education: Not on file   Highest education level: Bachelor's degree (e.g., BA, AB, BS)  Occupational History   Not on file  Tobacco Use   Smoking status: Former    Years: 5.00    Types: Cigarettes    Quit date: 05/03/1965    Years since quitting: 56.7   Smokeless tobacco: Never   Tobacco comments:    smoked 1962-1967, less than 1 ppd  Vaping Use   Vaping Use: Never used  Substance and Sexual Activity   Alcohol use: Yes     Alcohol/week: 14.0 standard drinks of alcohol    Types: 14 Glasses of wine per week    Comment: 2-3 /day   Drug use: No   Sexual activity: Yes  Other Topics Concern   Not on file  Social History Narrative   Married   2-3 glasses wine/day   remote small vol smmoker   Social Determinants of Health   Financial Resource Strain: Low Risk  (07/06/2021)   Overall Financial Resource Strain (CARDIA)    Difficulty of Paying Living Expenses: Not hard at all  Food Insecurity: No Food Insecurity (07/06/2021)   Hunger Vital Sign    Worried About Running Out of Food in the Last Year: Never true    Ran Out of Food in the Last Year: Never true  Transportation Needs: No Transportation Needs (07/06/2021)   PRAPARE - Hydrologist (Medical): No    Lack of Transportation (Non-Medical): No  Physical Activity: Sufficiently Active (07/06/2021)   Exercise Vital Sign    Days of Exercise per Week: 6 days    Minutes of Exercise per Session: 60 min  Stress: No Stress Concern Present (07/06/2021)   [place]    Feeling of Stress : Not at all  Social Connections: [place] (07/06/2021)   Social Connection and Isolation Panel [NHANES]    Frequency of Communication with Friends and Family: More than three times a week    Frequency of Social Gatherings with Friends and Family: More than three times a week    Attends Religious Services: More than 4 times per year    Active Member of [employer] or Organizations: Yes    Attends Music therapist: More than 4 times per year    Marital Status: Married  Human resources officer Violence: Not At Risk (07/06/2021)   Humiliation, Afraid, Rape, and Kick questionnaire    Fear of Current or Ex-Partner: No    Emotionally Abused: No    Physically Abused: No    Sexually Abused: No      Current Outpatient Medications:    albuterol (VENTOLIN HFA) 108 (90 Base) MCG/ACT inhaler,  Inhale 2 puffs into the lungs every 6 (six) hours as needed for wheezing or shortness of breath., Disp: 8 g, Rfl: 0   amLODipine (NORVASC) 5 MG tablet, TAKE 1 TABLET(5 MG) BY MOUTH DAILY, Disp: 90 tablet, Rfl: 2   aspirin 81 MG tablet, Take 81 mg by mouth daily.,  Disp: , Rfl:    benzonatate (TESSALON) 100 MG capsule, Take 1 capsule (100 mg total) by mouth 2 (two) times daily as needed for up to 10 days., Disp: 20 capsule, Rfl: 0   budesonide-formoterol (SYMBICORT) 160-4.5 MCG/ACT inhaler, INHALE 1 TO 2 PUFFS EVERY 12 HOURS AS NEEDED. GARGLE AND SPIT AFTER USE, Disp: 10.2 g, Rfl: 11   calcium citrate-vitamin D (CITRACAL+D) 315-200 MG-UNIT per tablet, Take 1 tablet by mouth daily., Disp: , Rfl:    Carboxymethylcellulose Sod PF (RETAINE CMC) 0.5 % SOLN, Apply to eye., Disp: , Rfl:    Cholecalciferol (VITAMIN D3) 1000 UNITS CAPS, Take by mouth daily., Disp: , Rfl:    co-enzyme Q-10 30 MG capsule, Take 50 mg by mouth daily., Disp: , Rfl:    doxycycline (VIBRA-TABS) 100 MG tablet, Take 1 tablet (100 mg total) by mouth 2 (two) times daily for 7 days., Disp: 14 tablet, Rfl: 0   FINACEA 15 % cream, Apply 1 application topically 2 (two) times daily. For rosacea, Disp: , Rfl: 0   hydrochlorothiazide (MICROZIDE) 12.5 MG capsule, TAKE 1 CAPSULE(12.5 MG) BY MOUTH DAILY, Disp: 90 capsule, Rfl: 2   HYDROcodone bit-homatropine (HYDROMET) 5-1.5 MG/5ML syrup, Take 5 mLs by mouth every 12 (twelve) hours as needed for up to 10 days for cough., Disp: 100 mL, Rfl: 0   ipratropium (ATROVENT) 0.06 % nasal spray, Place 2 sprays into both nostrils 4 (four) times daily., Disp: 15 mL, Rfl: 3   losartan (COZAAR) 100 MG tablet, TAKE 1 TABLET(100 MG) BY MOUTH DAILY, Disp: 90 tablet, Rfl: 2   Misc Natural Products (TART CHERRY ADVANCED PO), Take 2,400 mg by mouth daily., Disp: , Rfl:    montelukast (SINGULAIR) 10 MG tablet, TAKE 1 TABLET(10 MG) BY MOUTH AT BEDTIME, Disp: 90 tablet, Rfl: 3   Multiple Vitamins-Minerals (MULTIVITAMIN  WITH MINERALS) tablet, Take 1 tablet by mouth daily., Disp: , Rfl:    OMEGA 3 1000 MG CAPS, Take 1,000 mg by mouth daily., Disp: , Rfl:    pantoprazole (PROTONIX) 40 MG tablet, Take 1 tablet (40 mg total) by mouth daily., Disp: 30 tablet, Rfl: 3   predniSONE (DELTASONE) 20 MG tablet, Take 2 tablets (40 mg total) by mouth daily with breakfast for 5 days., Disp: 10 tablet, Rfl: 0   psyllium (METAMUCIL) 58.6 % powder, Take 1 packet by mouth daily., Disp: , Rfl:    rosuvastatin (CRESTOR) 5 MG tablet, TAKE 1 TABLET(5 MG) BY MOUTH DAILY, Disp: 90 tablet, Rfl: 1  EXAM:  VITALS per patient if applicable:Ht '4\' 8"'$  (1.422 m)   BMI 25.67 kg/m   GENERAL: alert, oriented, appears well and in no acute distress  HEENT: atraumatic, conjunctiva clear, no obvious abnormalities on inspection of external nose and ears  NECK: normal movements of the head and neck  LUNGS: on inspection no signs of respiratory distress, breathing rate appears normal, no obvious gross SOB, gasping.  Wheezing and cough with forced expiration.  CV: no obvious cyanosis  Courtney: moves all visible extremities without noticeable abnormality  PSYCH/NEURO: pleasant and cooperative, no obvious depression or anxiety, speech and thought processing grossly intact  ASSESSMENT AND PLAN:  Discussed the following assessment and plan:  Intermittent asthma with acute exacerbation, unspecified asthma severity - Plan: predniSONE (DELTASONE) 20 MG tablet Albuterol inh 2 puff every 6 hours for a week then as needed for wheezing or shortness of breath.  Continue Symbicort 160-4.5 mcg twice daily. She has taken prednisone in the past, it has been  well-tolerated.  We discussed some side effects, recommend prednisone 40 mg daily with breakfast for 3 to 5 days. She was clearly instructed about warning signs.  URI, acute - Plan: HYDROcodone bit-homatropine (HYDROMET) 5-1.5 MG/5ML syrup, benzonatate (TESSALON) 100 MG capsule Symptoms suggests a viral  etiology, I explained patient that symptomatic treatment is usually recommended in this case, so I do not think abx is needed at this time. Sent prescription for doxycycline 100 mg to start taking if symptoms get worse or she is not elevated in 3 to 4 days. Hydromet prescription sent as requested.  Also recommend trying benzonatate. Adequate hydration and plain Mucinex. Instructed to monitor for signs of complications, including new onset of fever among some, clearly instructed about warning signs. I also explained that cough and nasal congestion can last a few days and sometimes weeks. F/U in 10-14 days if not any better, before if worsening symptoms.  We discussed possible serious and likely etiologies, options for evaluation and workup, limitations of telemedicine visit vs in person visit, treatment, treatment risks and precautions. The patient was advised to call back or seek an in-person evaluation if the symptoms worsen or if the condition fails to improve as anticipated. I discussed the assessment and treatment plan with the patient. The patient was provided an opportunity to ask questions and all were answered. The patient agreed with the plan and demonstrated an understanding of the instructions.  Return if symptoms worsen or fail to improve.  Noelene Gang [name], MD

## 2022-01-25 LAB — HM MAMMOGRAPHY

## 2022-01-25 NOTE — Progress Notes (Unsigned)
HPI: [name] is a 76 y.o. female, who is here today to follow on recent visit. Virtual visit on 01/22/22 because 6 days of respiratory symptoms. She is on prednisone, recommended to take for 3 to 5 days. She reported that her symptoms are not better. Negative for fever, chills, exertional CP, palpitations, orthopnea,or PND. + Nasal congestion, rhinorrhea, frontal pressure headache, postnasal drainage, wheezing, chest tightness,and "little" DOE. She did not start abx. She is on albuterol 2 puffs every 4-6 hours as needed and Symbicort 160-4.5 mcg twice daily. She is taking Hydromet at bedtime. Coughing spells during the day, she is not taking benzonatate. She has been evaluated by pulmonologist in the past, saw Dr Melvyn Novas in 2017. According to noted PFT's was normal on 08/28/2015.?  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  GERD: She is on Protonix 40 mg daily which has helped with heartburn and acid reflux but seems to be causing "indigestion." Bloating sensation and easy satiety.Tums 1-2  per day helps with these symptoms.  Negative for abdominal pain, nausea, vomiting, changes in bowel habits, blood in stool or melena.  Review of Systems  Constitutional:  Positive for fatigue. Negative for activity change and appetite change.  HENT:  Positive for congestion. Negative for ear pain, facial swelling, mouth sores, sore throat and trouble swallowing.   Eyes:  Negative for discharge, redness and itching.  Musculoskeletal:  Positive for arthralgias. Negative for gait problem and myalgias.  Skin:  Negative for rash.  Allergic/Immunologic: Positive for environmental allergies.  Neurological:  Negative for syncope, facial asymmetry and weakness.  Hematological:  Negative for adenopathy. Does not bruise/bleed easily.  Rest see pertinent positives and negatives per HPI.  Current Outpatient Medications on File Prior to Visit  Medication Sig Dispense Refill    amLODipine (NORVASC) 5 MG tablet TAKE 1 TABLET(5 MG) BY MOUTH DAILY 90 tablet 2   aspirin 81 MG tablet Take 81 mg by mouth daily.     benzonatate (TESSALON) 100 MG capsule Take 1 capsule (100 mg total) by mouth 2 (two) times daily as needed for up to 10 days. 20 capsule 0   budesonide-formoterol (SYMBICORT) 160-4.5 MCG/ACT inhaler INHALE 1 TO 2 PUFFS EVERY 12 HOURS AS NEEDED. GARGLE AND SPIT AFTER USE 10.2 g 11   calcium citrate-vitamin D (CITRACAL+D) 315-200 MG-UNIT per tablet Take 1 tablet by mouth daily.     Carboxymethylcellulose Sod PF (RETAINE CMC) 0.5 % SOLN Apply to eye.     Cholecalciferol (VITAMIN D3) 1000 UNITS CAPS Take by mouth daily.     co-enzyme Q-10 30 MG capsule Take 50 mg by mouth daily.     doxycycline (VIBRA-TABS) 100 MG tablet Take 1 tablet (100 mg total) by mouth 2 (two) times daily for 7 days. 14 tablet 0   FINACEA 15 % cream Apply 1 application topically 2 (two) times daily. For rosacea  0   hydrochlorothiazide (MICROZIDE) 12.5 MG capsule TAKE 1 CAPSULE(12.5 MG) BY MOUTH DAILY 90 capsule 2   HYDROcodone bit-homatropine (HYDROMET) 5-1.5 MG/5ML syrup Take 5 mLs by mouth every 12 (twelve) hours as needed for up to 10 days for cough. 100 mL 0   ipratropium (ATROVENT) 0.06 % nasal spray Place 2 sprays into both nostrils 4 (four) times daily. 15 mL 3   losartan (COZAAR) 100 MG tablet TAKE 1 TABLET(100 MG) BY MOUTH DAILY 90 tablet 2   Misc Natural Products (TART CHERRY ADVANCED PO) Take 2,400 mg by mouth daily.  montelukast (SINGULAIR) 10 MG tablet TAKE 1 TABLET(10 MG) BY MOUTH AT BEDTIME 90 tablet 3   Multiple Vitamins-Minerals (MULTIVITAMIN WITH MINERALS) tablet Take 1 tablet by mouth daily.     OMEGA 3 1000 MG CAPS Take 1,000 mg by mouth daily.     predniSONE (DELTASONE) 20 MG tablet Take 2 tablets (40 mg total) by mouth daily with breakfast for 5 days. 10 tablet 0   psyllium (METAMUCIL) 58.6 % powder Take 1 packet by mouth daily.     rosuvastatin (CRESTOR) 5 MG tablet  TAKE 1 TABLET(5 MG) BY MOUTH DAILY 90 tablet 1   No current facility-administered medications on file prior to visit.   Past Medical History:  Diagnosis Date   Breast cancer [facility]) July 2012   post double mastectomy; Dr Humphrey Rolls   Diverticulosis    Family history of breast cancer    GERD (gastroesophageal reflux disease)    S/P dilation X 1   Hemorrhoids    Hypertension    Night sweats    OSA (obstructive sleep apnea) 08/03/2017   Very mild with AHI 7.2/hr.     Prolapsed internal hemorrhoids, grade 3 06/27/2017   Grade 2 on anoscopy exam but grade 3 by history and seen a colonoscopy as well All 3 columns banded 06/27/2017  09/21/2017 banded RP and RA     Reactive airway disease    No Known Allergies  Social History   Socioeconomic History   Marital status: Married    Spouse name: Not on file   Number of children: Not on file   Years of education: Not on file   Highest education level: Bachelor's degree (e.g., BA, AB, BS)  Occupational History   Not on file  Tobacco Use   Smoking status: Former    Years: 5.00    Types: Cigarettes    Quit date: 05/03/1965    Years since quitting: 56.7   Smokeless tobacco: Never   Tobacco comments:    smoked 1962-1967, less than 1 ppd  Vaping Use   Vaping Use: Never used  Substance and Sexual Activity   Alcohol use: Yes    Alcohol/week: 14.0 standard drinks of alcohol    Types: 14 Glasses of wine per week    Comment: 2-3 /day   Drug use: No   Sexual activity: Yes  Other Topics Concern   Not on file  Social History Narrative   Married   2-3 glasses wine/day   remote small vol smmoker   Social Determinants of Health   Financial Resource Strain: Low Risk  (07/06/2021)   Overall Financial Resource Strain (CARDIA)    Difficulty of Paying Living Expenses: Not hard at all  Food Insecurity: No Food Insecurity (07/06/2021)   Hunger Vital Sign    Worried About Running Out of Food in the Last Year: Never true    Ran Out of Food in the Last Year:  Never true  Transportation Needs: No Transportation Needs (07/06/2021)   PRAPARE - Hydrologist (Medical): No    Lack of Transportation (Non-Medical): No  Physical Activity: Sufficiently Active (07/06/2021)   Exercise Vital Sign    Days of Exercise per Week: 6 days    Minutes of Exercise per Session: 60 min  Stress: No Stress Concern Present (07/06/2021)   [place]    Feeling of Stress : Not at all  Social Connections: [place] (07/06/2021)   Social Connection and Isolation  Panel [NHANES]    Frequency of Communication with Friends and Family: More than three times a week    Frequency of Social Gatherings with Friends and Family: More than three times a week    Attends Religious Services: More than 4 times per year    Active Member of Clubs or Organizations: Yes    Attends Archivist Meetings: More than 4 times per year    Marital Status: Married   Vitals:   01/26/22 1427  BP: 100/60  Pulse: 71  Resp: 16  Temp: 98.9 F (37.2 C)  SpO2: 97%  Body mass index is 26.03 kg/m.  Physical Exam Vitals and nursing note reviewed.  Constitutional:      General: She is not in acute distress.    Appearance: She is well-developed. She is not ill-appearing.  HENT:     Head: Normocephalic and atraumatic.     Right Ear: Tympanic membrane, ear canal and external ear normal.     Left Ear: Tympanic membrane, ear canal and external ear normal.     Nose: Septal deviation present. No congestion or rhinorrhea.     Right Turbinates: Enlarged.     Left Turbinates: Enlarged.     Right Sinus: No maxillary sinus tenderness or frontal sinus tenderness.     Left Sinus: No maxillary sinus tenderness or frontal sinus tenderness.     Mouth/Throat:     Mouth: Mucous membranes are moist.     Pharynx: Oropharynx is clear.  Eyes:     Conjunctiva/sclera: Conjunctivae normal.  Cardiovascular:      Rate and Rhythm: Normal rate and regular rhythm.     Heart sounds: No murmur heard. Pulmonary:     Effort: Pulmonary effort is normal. No respiratory distress.     Breath sounds: No stridor. Wheezing and rhonchi present. No decreased breath sounds or rales.  Lymphadenopathy:     Cervical: No cervical adenopathy.  Skin:    General: Skin is warm.     Findings: No erythema or rash.  Neurological:     Mental Status: She is alert and oriented to person, place, and time.  Psychiatric:        Mood and Affect: Mood and affect normal.   ASSESSMENT AND PLAN:  [name] was seen today for follow-up.  Diagnoses and all orders for this visit: Orders Placed This Encounter  Procedures   DG Chest 2 View   Acute bronchitis, unspecified organism Most likely viral but because of persistent symptoms, recommend starting doxycycline 100 mg twice daily x7 days. Continue albuterol inhaler 2 puff every 4-6 hours as needed. She has incentive spirometer at home, recommend using it 3-4 times per day. Clearly instructed about warning signs. Further recommendation will be given according to chest x-ray results.  Mild intermittent asthma With acute exacerbation due to respiratory tract infection. Today wheezing is not significant. Complete prednisone treatment. Continue albuterol inhaler 2 puffs every 4-6 hours as needed. Continue Symbicort 160-4.5 mcg 2 puff twice daily. Instructed about warning signs. Follow-up in 4 weeks, before if needed.  GERD (gastroesophageal reflux disease) Symptoms have greatly improved with pantoprazole. We discussed some side effects. She prefers to continue for now. Continue GERD precautions.   I spent a total of 38 minutes in both face to face and non face to face activities for this visit on the date of this encounter. During this time history was obtained and documented, examination was performed, and assessment/plan discussed.  Return in about 4 weeks (around  02/23/2022).  Yohan Samons G. [name], MD  [facility]. [place] office.

## 2022-01-26 ENCOUNTER — Ambulatory Visit: Payer: Medicare PPO | Admitting: Family Medicine

## 2022-01-26 ENCOUNTER — Encounter: Payer: Self-pay | Admitting: Family Medicine

## 2022-01-26 ENCOUNTER — Ambulatory Visit (INDEPENDENT_AMBULATORY_CARE_PROVIDER_SITE_OTHER): Payer: Medicare PPO

## 2022-01-26 VITALS — BP 100/60 | HR 71 | Temp 98.9°F | Resp 16 | Ht <= 58 in | Wt 116.1 lb

## 2022-01-26 DIAGNOSIS — J209 Acute bronchitis, unspecified: Secondary | ICD-10-CM

## 2022-01-26 DIAGNOSIS — K219 Gastro-esophageal reflux disease without esophagitis: Secondary | ICD-10-CM | POA: Diagnosis not present

## 2022-01-26 DIAGNOSIS — J4520 Mild intermittent asthma, uncomplicated: Secondary | ICD-10-CM | POA: Diagnosis not present

## 2022-01-26 DIAGNOSIS — K449 Diaphragmatic hernia without obstruction or gangrene: Secondary | ICD-10-CM | POA: Diagnosis not present

## 2022-01-26 MED ORDER — ALBUTEROL SULFATE HFA 108 (90 BASE) MCG/ACT IN AERS: 2.0000 | INHALATION_SPRAY | Freq: Four times a day (QID) | RESPIRATORY_TRACT | 3 refills | Status: DC | PRN

## 2022-01-26 MED ORDER — PANTOPRAZOLE SODIUM 40 MG PO TBEC: 40.0000 mg | DELAYED_RELEASE_TABLET | Freq: Every day | ORAL | 2 refills | Status: DC

## 2022-01-26 NOTE — Patient Instructions (Addendum)
A few things to remember from today's visit:   Acute bronchitis, unspecified organism  Gastroesophageal reflux disease, unspecified whether esophagitis present - Plan: pantoprazole (PROTONIX) 40 MG tablet  Mild intermittent asthma, unspecified whether complicated - Plan: DG Chest 2 View, albuterol (VENTOLIN HFA) 108 (90 Base) MCG/ACT inhaler  Shortness of breath - Plan: albuterol (VENTOLIN HFA) 108 (90 Base) MCG/ACT inhaler  Continue Albuterol, Symbicort,and Mucinex for noq. Start using incentive spirometry a few times per day. Monitor for fever. Start taking Doxycycline.  If you need refills for medications you take chronically, please call your pharmacy. Do not use My Chart to request refills or for acute issues that need immediate attention. If you send a my chart message, it may take a few days to be addressed, specially if I am not in the office.  Please be sure medication list is accurate. If a new problem present, please set up appointment sooner than planned today.

## 2022-01-26 NOTE — Assessment & Plan Note (Signed)
Symptoms have greatly improved with pantoprazole. We discussed some side effects. She prefers to continue for now. Continue GERD precautions.

## 2022-01-26 NOTE — Assessment & Plan Note (Signed)
With acute exacerbation due to respiratory tract infection. Today wheezing is not significant. Complete prednisone treatment. Continue albuterol inhaler 2 puffs every 4-6 hours as needed. Continue Symbicort 160-4.5 mcg 2 puff twice daily. Instructed about warning signs. Follow-up in 4 weeks, before if needed.

## 2022-01-29 ENCOUNTER — Encounter: Payer: Self-pay | Admitting: Family Medicine

## 2022-02-04 ENCOUNTER — Other Ambulatory Visit: Payer: Self-pay

## 2022-02-04 ENCOUNTER — Encounter: Payer: Self-pay | Admitting: Adult Health

## 2022-02-04 ENCOUNTER — Inpatient Hospital Stay: Payer: Medicare PPO | Attending: Adult Health | Admitting: Adult Health

## 2022-02-04 VITALS — BP 123/64 | HR 75 | Temp 97.7°F | Resp 16 | Ht <= 58 in | Wt 111.9 lb

## 2022-02-04 DIAGNOSIS — Z853 Personal history of malignant neoplasm of breast: Secondary | ICD-10-CM | POA: Diagnosis not present

## 2022-02-04 DIAGNOSIS — Z08 Encounter for follow-up examination after completed treatment for malignant neoplasm: Secondary | ICD-10-CM | POA: Diagnosis not present

## 2022-02-04 DIAGNOSIS — C50912 Malignant neoplasm of unspecified site of left female breast: Secondary | ICD-10-CM

## 2022-02-04 NOTE — Progress Notes (Signed)
[facility] Follow up:    [name], [name], MD [address] [place] [address]   DIAGNOSIS:  Cancer Staging  No matching staging information was found for the patient.   SUMMARY OF ONCOLOGIC HISTORY: (1) status post bilateral mastectomies with bilateral sentinel lymph node sampling 01/13/2011, showing             (a) on the left, 2 foci of invasive ductal carcinoma, the larger measuring 1.2 cm, grade 2, both tumors being strongly estrogen and progesterone receptor positive, with MIB-once of 13-14%, and HER-2/neu negative. Her (pT1c pN0 = stage IA)             (b) on the right, apT1c pN0, stage IA invasive ductal carcinoma, grade 2, estrogen receptor 100% positive, progesterone receptor 9% positive, with an MIB-1 of 12% and no HER-2 amplification   (2) Oncotype DX on the largest tumor showed a score of 10, suggesting a risk of recurrence outside of the breast within the next 10 years would be 7% if the patient only systemic therapy was tamoxifen for 5 years. It also predicts no benefit from chemotherapy   (3) letrozole from October of 2012 through 02/2016  A.  Most recent bone density in January 2023 was normal.     CURRENT THERAPY: observation  INTERVAL HISTORY: Courtney [name] 76 y.o. female returns for follow-up of her history of breast cancer.  She returns to [place] every year for her breast exam.  She does have concern about the implants as they are 79 years old.  She continues to see primary care regularly and is up-to-date with her health maintenance.  Her most recent bone density was completed on May 07, 2021 at [facility] and showed a normal bone density with a T score of -1.0 in the left femur.   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   Bursitis of left hip 04/01/2021   Degenerative arthritis of right knee 12/05/2020   Metatarsalgia 12/05/2020   Scoliosis 09/11/2018    Rosacea 07/11/2018   OSA (obstructive sleep apnea) 08/03/2017   Prolapsed internal hemorrhoids, grade 3 max 06/27/2017   Wheezing 06/22/2017   Diverticulosis    Night sweats    Intermittent palpitations 01/10/2017   Mild intermittent asthma 08/31/2015   GERD (gastroesophageal reflux disease) 07/25/2015   Varicose veins of bilateral lower extremities with other complications 32/35/5732   Arthritis 05/27/2014   Skin cancer, basal cell 02/20/2014   Fasting hyperglycemia 02/29/2012   History of bilateral breast cancer 02/29/2012   Osteopenia 02/13/2012   HTN (hypertension) 04/06/2011   COLONIC POLYPS, HX OF 01/14/2010   DIVERTICULOSIS, COLON 01/25/2007    has No Known Allergies.  MEDICAL HISTORY: Past Medical History:  Diagnosis Date   Breast cancer [facility]) July 2012   post double mastectomy; Dr Humphrey Rolls   Diverticulosis    Family history of breast cancer    GERD (gastroesophageal reflux disease)    S/P dilation X 1   Hemorrhoids    Hypertension    Night sweats    OSA (obstructive sleep apnea) 08/03/2017   Very mild with AHI 7.2/hr.     Prolapsed internal hemorrhoids, grade 3 06/27/2017   Grade 2 on anoscopy exam but grade 3 by history and seen a colonoscopy as well All 3 columns banded 06/27/2017  09/21/2017 banded RP and RA     Reactive airway disease  SURGICAL HISTORY: Past Surgical History:  Procedure Laterality Date   ABDOMINAL HYSTERECTOMY     bilateral breast reconstruction  Jan 2013   BREAST SURGERY  01/13/11   bilateral total mastectomy; Dr Dalbert Batman   COLONOSCOPY  2014   Dr Olevia Perches; hyperplastic polyp   ESOPHAGEAL DILATION  2014   HEMORRHOID BANDING  2019   February and May   NASAL SEPTUM SURGERY     RECONSTRUCTION / CORRECTION OF NIPPLE / Tresa Moore  June 2013   THYROGLOSSAL DUCT CYST      SOCIAL HISTORY: Social History   Socioeconomic History   Marital status: Married    Spouse name: Not on file   Number of children: Not on file   Years of education: Not on  file   Highest education level: Bachelor's degree (e.g., BA, AB, BS)  Occupational History   Not on file  Tobacco Use   Smoking status: Former    Years: 5.00    Types: Cigarettes    Quit date: 05/03/1965    Years since quitting: 56.7   Smokeless tobacco: Never   Tobacco comments:    smoked 1962-1967, less than 1 ppd  Vaping Use   Vaping Use: Never used  Substance and Sexual Activity   Alcohol use: Yes    Alcohol/week: 14.0 standard drinks of alcohol    Types: 14 Glasses of wine per week    Comment: 2-3 /day   Drug use: No   Sexual activity: Yes  Other Topics Concern   Not on file  Social History Narrative   Married   2-3 glasses wine/day   remote small vol smmoker   Social Determinants of Health   Financial Resource Strain: Low Risk  (07/06/2021)   Overall Financial Resource Strain (CARDIA)    Difficulty of Paying Living Expenses: Not hard at all  Food Insecurity: No Food Insecurity (07/06/2021)   Hunger Vital Sign    Worried About Running Out of Food in the Last Year: Never true    Ran Out of Food in the Last Year: Never true  Transportation Needs: No Transportation Needs (07/06/2021)   PRAPARE - Hydrologist (Medical): No    Lack of Transportation (Non-Medical): No  Physical Activity: Sufficiently Active (07/06/2021)   Exercise Vital Sign    Days of Exercise per Week: 6 days    Minutes of Exercise per Session: 60 min  Stress: No Stress Concern Present (07/06/2021)   [place]    Feeling of Stress : Not at all  Social Connections: [place] (07/06/2021)   Social Connection and Isolation Panel [NHANES]    Frequency of Communication with Friends and Family: More than three times a week    Frequency of Social Gatherings with Friends and Family: More than three times a week    Attends Religious Services: More than 4 times per year    Active Member of [employer] or Organizations:  Yes    Attends Music therapist: More than 4 times per year    Marital Status: Married  Human resources officer Violence: Not At Risk (07/06/2021)   Humiliation, Afraid, Rape, and Kick questionnaire    Fear of Current or Ex-Partner: No    Emotionally Abused: No    Physically Abused: No    Sexually Abused: No    FAMILY HISTORY: Family History  Problem Relation Age of Onset   Breast cancer Mother 78   Colon cancer Mother 75  Cervical cancer Mother 31       s/p hysterectomy   Heart attack Father 52   Diabetes Father        probable pre DM   Skin cancer Father        maybe melanoma   Cancer Sister 62       appendiceal   Heart attack Maternal Grandfather 57   Heart attack Paternal Uncle        X2 ; >50y   Heart attack Maternal Uncle        d. 1   Parkinson's disease Maternal Grandmother        d. 43   Heart attack Paternal Grandfather    Breast cancer Other        unspecified age; maternal great aunt (MGM's sister)   Breast cancer Cousin        maternal 1st cousin, once-removed; unspecified age   Heart attack Paternal Uncle        >50y   Stroke Neg Hx     Review of Systems  Constitutional:  Negative for appetite change, chills, fatigue, fever and unexpected weight change.  HENT:   Negative for hearing loss, lump/mass and trouble swallowing.   Eyes:  Negative for eye problems and icterus.  Respiratory:  Negative for chest tightness, cough and shortness of breath.   Cardiovascular:  Negative for chest pain, leg swelling and palpitations.  Gastrointestinal:  Negative for abdominal distention, abdominal pain, constipation, diarrhea, nausea and vomiting.  Endocrine: Negative for hot flashes.  Genitourinary:  Negative for difficulty urinating.   Musculoskeletal:  Negative for arthralgias.  Skin:  Negative for itching and rash.  Neurological:  Negative for dizziness, extremity weakness, headaches and numbness.  Hematological:  Negative for adenopathy. Does not  bruise/bleed easily.  Psychiatric/Behavioral:  Negative for depression. The patient is not nervous/anxious.       PHYSICAL EXAMINATION  ECOG PERFORMANCE STATUS: 0 - Asymptomatic  Vitals:   02/04/22 1121  BP: 123/64  Pulse: 75  Resp: 16  Temp: 97.7 F (36.5 C)  SpO2: 99%    Physical Exam Constitutional:      General: She is not in acute distress.    Appearance: Normal appearance. She is not toxic-appearing.  HENT:     Head: Normocephalic and atraumatic.  Eyes:     General: No scleral icterus. Cardiovascular:     Rate and Rhythm: Normal rate and regular rhythm.     Pulses: Normal pulses.     Heart sounds: Normal heart sounds.  Pulmonary:     Effort: Pulmonary effort is normal.     Breath sounds: Normal breath sounds.  Chest:     Comments: Status post bilateral mastectomies with reconstruction no sign of local recurrence. Abdominal:     General: Abdomen is flat. Bowel sounds are normal. There is no distension.     Palpations: Abdomen is soft.     Tenderness: There is no abdominal tenderness.  Musculoskeletal:        General: No swelling.     Cervical back: Neck supple.  Lymphadenopathy:     Cervical: No cervical adenopathy.  Skin:    General: Skin is warm and dry.     Findings: No rash.  Neurological:     General: No focal deficit present.     Mental Status: She is alert.  Psychiatric:        Mood and Affect: Mood normal.        Behavior: Behavior normal.  LABORATORY DATA: None for this visit   ASSESSMENT and THERAPY PLAN:   History of bilateral breast cancer Courtney [name] is a 76 year old woman with history of bilateral breast cancer diagnosed in 2012 status post bilateral mastectomies followed by 5 years of antiestrogen therapy with letrozole that completed in October 2017.  Courtney [name] has no clinical signs of breast cancer recurrence.  We discussed upcoming vaccinations and she is still contemplating whether she will receive COVID or RSV but does plan on  receiving the flu vaccine.  We also discussed her breast implants and being that they are 78 years old I recommended that she follow-up with plastic surgery to evaluate them.  She would like to see Dr. Harlow Mares unless he is no longer practicing and if he is not practicing she would like to see Dr. Iran Planas.  I recommended continued healthy diet, exercise, and we will see her back in 1 year for continued long-term follow-up of her history of breast cancer.    All questions were answered. The patient knows to call the clinic with any problems, questions or concerns. We can certainly see the patient much sooner if necessary.  Total encounter time:20 minutes*in face-to-face visit time, chart review, lab review, care coordination, order entry, and documentation of the encounter time.    Wilber Bihari, NP 02/04/22 12:00 PM Medical Oncology and Hematology [facility] [place], [place] [address] Tel. [phone]    Fax. [phone]  *Total Encounter Time as defined by the Centers for Medicare and Medicaid Services includes, in addition to the face-to-face time of a patient visit (documented in the note above) non-face-to-face time: obtaining and reviewing outside history, ordering and reviewing medications, tests or procedures, care coordination (communications with other health care professionals or caregivers) and documentation in the medical record.

## 2022-02-04 NOTE — Assessment & Plan Note (Addendum)
Courtney [name] is a 76 year old woman with history of bilateral breast cancer diagnosed in 2012 status post bilateral mastectomies followed by 5 years of antiestrogen therapy with letrozole that completed in October 2017.  Courtney [name] has no clinical signs of breast cancer recurrence.  We discussed upcoming vaccinations and she is still contemplating whether she will receive COVID or RSV but does plan on receiving the flu vaccine.  We also discussed her breast implants and being that they are 78 years old I recommended that she follow-up with plastic surgery to evaluate them.  She would like to see Dr. Harlow Mares unless he is no longer practicing and if he is not practicing she would like to see Dr. Iran Planas.  I recommended continued healthy diet, exercise, and we will see her back in 1 year for continued long-term follow-up of her history of breast cancer.

## 2022-02-04 NOTE — Progress Notes (Signed)
Referral faxed to Dr Harlow Mares' office for f/u. Fax confirmation received.

## 2022-02-09 ENCOUNTER — Ambulatory Visit: Payer: Medicare PPO | Admitting: Family Medicine

## 2022-02-15 DIAGNOSIS — Z9889 Other specified postprocedural states: Secondary | ICD-10-CM | POA: Diagnosis not present

## 2022-02-15 DIAGNOSIS — Z853 Personal history of malignant neoplasm of breast: Secondary | ICD-10-CM | POA: Diagnosis not present

## 2022-03-05 ENCOUNTER — Ambulatory Visit (INDEPENDENT_AMBULATORY_CARE_PROVIDER_SITE_OTHER): Payer: Medicare PPO

## 2022-03-05 DIAGNOSIS — Z23 Encounter for immunization: Secondary | ICD-10-CM

## 2022-03-08 ENCOUNTER — Telehealth: Payer: Self-pay | Admitting: Family Medicine

## 2022-03-08 MED ORDER — ROSUVASTATIN CALCIUM 5 MG PO TABS: ORAL_TABLET | ORAL | 1 refills | Status: DC

## 2022-03-08 NOTE — Telephone Encounter (Signed)
Pharmacy called to get refill on rosuvastatin (CRESTOR) 5 MG tablet       Please send to [place] [place] [address] Phone: [phone]  Fax: [phone]         [facility] is now patient's primary pharmacy so all current and new prescriptions will need to go to them.    Please advise

## 2022-03-08 NOTE — Telephone Encounter (Signed)
Rx sent in

## 2022-03-17 ENCOUNTER — Encounter: Payer: Medicare PPO | Admitting: Family Medicine

## 2022-03-24 DIAGNOSIS — Z9882 Breast implant status: Secondary | ICD-10-CM | POA: Diagnosis not present

## 2022-03-24 DIAGNOSIS — Z853 Personal history of malignant neoplasm of breast: Secondary | ICD-10-CM | POA: Diagnosis not present

## 2022-03-24 DIAGNOSIS — Z9889 Other specified postprocedural states: Secondary | ICD-10-CM | POA: Diagnosis not present

## 2022-03-24 DIAGNOSIS — Z9013 Acquired absence of bilateral breasts and nipples: Secondary | ICD-10-CM | POA: Diagnosis not present

## 2022-03-30 ENCOUNTER — Other Ambulatory Visit: Payer: Self-pay | Admitting: *Deleted

## 2022-03-30 MED ORDER — MONTELUKAST SODIUM 10 MG PO TABS: ORAL_TABLET | ORAL | 3 refills | Status: DC

## 2022-03-30 NOTE — Addendum Note (Signed)
Addended by: Agnes Lawrence on: 03/30/2022 11:32 AM   Modules accepted: Orders

## 2022-04-02 ENCOUNTER — Other Ambulatory Visit: Payer: Self-pay

## 2022-04-02 MED ORDER — MONTELUKAST SODIUM 10 MG PO TABS: ORAL_TABLET | ORAL | 3 refills | Status: DC

## 2022-04-12 ENCOUNTER — Encounter: Payer: Self-pay | Admitting: Internal Medicine

## 2022-04-12 DIAGNOSIS — Z800 Family history of malignant neoplasm of digestive organs: Secondary | ICD-10-CM | POA: Insufficient documentation

## 2022-04-14 DIAGNOSIS — M79644 Pain in right finger(s): Secondary | ICD-10-CM | POA: Diagnosis not present

## 2022-04-14 DIAGNOSIS — M13841 Other specified arthritis, right hand: Secondary | ICD-10-CM | POA: Diagnosis not present

## 2022-04-14 DIAGNOSIS — M13839 Other specified arthritis, unspecified wrist: Secondary | ICD-10-CM | POA: Diagnosis not present

## 2022-04-14 DIAGNOSIS — M79642 Pain in left hand: Secondary | ICD-10-CM | POA: Diagnosis not present

## 2022-05-25 DIAGNOSIS — D2272 Melanocytic nevi of left lower limb, including hip: Secondary | ICD-10-CM | POA: Diagnosis not present

## 2022-05-25 DIAGNOSIS — L814 Other melanin hyperpigmentation: Secondary | ICD-10-CM | POA: Diagnosis not present

## 2022-05-25 DIAGNOSIS — D2271 Melanocytic nevi of right lower limb, including hip: Secondary | ICD-10-CM | POA: Diagnosis not present

## 2022-05-25 DIAGNOSIS — D2261 Melanocytic nevi of right upper limb, including shoulder: Secondary | ICD-10-CM | POA: Diagnosis not present

## 2022-05-25 DIAGNOSIS — D225 Melanocytic nevi of trunk: Secondary | ICD-10-CM | POA: Diagnosis not present

## 2022-05-25 DIAGNOSIS — C44519 Basal cell carcinoma of skin of other part of trunk: Secondary | ICD-10-CM | POA: Diagnosis not present

## 2022-05-25 DIAGNOSIS — L821 Other seborrheic keratosis: Secondary | ICD-10-CM | POA: Diagnosis not present

## 2022-05-25 DIAGNOSIS — Z85828 Personal history of other malignant neoplasm of skin: Secondary | ICD-10-CM | POA: Diagnosis not present

## 2022-05-25 DIAGNOSIS — D2262 Melanocytic nevi of left upper limb, including shoulder: Secondary | ICD-10-CM | POA: Diagnosis not present

## 2022-06-10 ENCOUNTER — Encounter: Payer: Self-pay | Admitting: Internal Medicine

## 2022-06-10 ENCOUNTER — Ambulatory Visit: Payer: Medicare PPO | Admitting: Internal Medicine

## 2022-06-10 VITALS — BP 106/60 | HR 69 | Ht <= 58 in | Wt 115.0 lb

## 2022-06-10 DIAGNOSIS — K641 Second degree hemorrhoids: Secondary | ICD-10-CM | POA: Diagnosis not present

## 2022-06-10 DIAGNOSIS — K449 Diaphragmatic hernia without obstruction or gangrene: Secondary | ICD-10-CM

## 2022-06-10 DIAGNOSIS — Z800 Family history of malignant neoplasm of digestive organs: Secondary | ICD-10-CM

## 2022-06-10 DIAGNOSIS — R1319 Other dysphagia: Secondary | ICD-10-CM

## 2022-06-10 DIAGNOSIS — K219 Gastro-esophageal reflux disease without esophagitis: Secondary | ICD-10-CM

## 2022-06-10 NOTE — Patient Instructions (Addendum)
HEMORRHOID BANDING PROCEDURE    FOLLOW-UP CARE   The procedure you have had should have been relatively painless since the banding of the area involved does not have nerve endings and there is no pain sensation.  The rubber band cuts off the blood supply to the hemorrhoid and the band may fall off as soon as 48 hours after the banding (the band may occasionally be seen in the toilet bowl following a bowel movement). You may notice a temporary feeling of fullness in the rectum which should respond adequately to plain Tylenol or Motrin.  Following the banding, avoid strenuous exercise that evening and resume full activity the next day.  A sitz bath (soaking in a warm tub) or bidet is soothing, and can be useful for cleansing the area after bowel movements.     To avoid constipation, take two tablespoons of natural wheat bran, natural oat bran, flax, Benefiber or any over the counter fiber supplement and increase your water intake to 7-8 glasses daily.    Unless you have been prescribed anorectal medication, do not put anything inside your rectum for two weeks: No suppositories, enemas, fingers, etc.  Occasionally, you may have more bleeding than usual after the banding procedure.  This is often from the untreated hemorrhoids rather than the treated one.  Don't be concerned if there is a tablespoon or so of blood.  If there is more blood than this, lie flat with your bottom higher than your head and apply an ice pack to the area. If the bleeding does not stop within a half an hour or if you feel faint, call our office at [phone] or go to the emergency room.  Problems are not common; however, if there is a substantial amount of bleeding, severe pain, chills, fever or difficulty passing urine (very rare) or other problems, you should call us at [phone] or report to the nearest emergency room.  Do not stay seated continuously for more than 2-3 hours for a day or two after the procedure.   Tighten your buttock muscles 10-15 times every two hours and take 10-15 deep breaths every 1-2 hours.  Do not spend more than a few minutes on the toilet if you cannot empty your bowel; instead re-visit the toilet at a later time.   You have been scheduled for an endoscopy and colonoscopy. Please follow the written instructions given to you at your visit today. Please pick up your prep supplies at the pharmacy within the next 1-3 days. If you use inhalers (even only as needed), please bring them with you on the day of your procedure.   Due to recent changes in healthcare laws, you may see the results of your imaging and laboratory studies on MyChart before your provider has had a chance to review them.  We understand that in some cases there may be results that are confusing or concerning to you. Not all laboratory results come back in the same time frame and the provider may be waiting for multiple results in order to interpret others.  Please give [place] 48 hours in order for your provider to thoroughly review all the results before contacting the office for clarification of your results.   It is okay to use your pantoprazole twice a day as needed.   I appreciate the opportunity to care for you. Silvano Rusk, MD, [facility]

## 2022-06-10 NOTE — Progress Notes (Signed)
Courtney [name] 77 y.o. [birth year] [id number]  Assessment & Plan:   Encounter Diagnoses  Name Primary?   Gastroesophageal reflux disease, unspecified whether esophagitis present Yes   Esophageal dysphagia    Hiatal hernia    Family history of colon cancer    Prolapsed internal hemorrhoids, grade 2    She is having flare of GERD, recurrent dysphagia and has a hiatal hernia.  Evaluate with EGD and likely treat with esophageal dilation.  Further plans pending that.  She wants to know if she can use twice daily pantoprazole at times which she has been doing occasionally and that is fine for now.  Screening colonoscopy will be scheduled for the same day of her EGD.  The risks and benefits as well as alternatives of endoscopic procedure(s) have been reviewed. All questions answered. The patient agrees to proceed.  Right posterior hemorrhoid banded today reassess at colonoscopy.   CC: Courtney [name], [name], MD   Subjective:   Chief Complaint: Reflux, hemorrhoids  HPI 77 year old white woman with a history of GERD as well as prolapsed internal hemorrhoids status post multiple banding procedures 20 19-20 20.  Also has a family history of colon cancer.  She has been having some GERD flareups with increasing heartburn issues.  She had been on omeprazole was having more problems with "burping up acid intermittently" and this was occurring about an hour after meals.  Things are better after changing to Protonix 40 mg daily.  However she still has symptoms she is having some intermittent dysphagia and has a history of esophageal stricture dilation in 2014.  Sometimes she coughs with the first couple of bites of food.  She had a chest x-ray recently and is asking questions about her hiatal hernia.  We reviewed the pathophysiology of hiatal hernias. 01/26/2022 visit with Dr. [name], PCP is reviewed.  She had an asthma exacerbation.   EGD with savary dilation to 17 mm 05/19/2012 Courtney Edis, MD also  with fundic gland polyps and 3 cm hiatal hernia   Family history of colon cancer in her mother and sister with last colonoscopy December 2018.  Hemorrhoids and diverticulosis.  She does not have a history of polyps.  She is interested in pursuing another screening colonoscopy.  She has had a good response to banding of grade 3 internal hemorrhoids in 2019 and 2020 but is starting to have some symptoms again with difficulty cleansing.  She does not have any need to manually reduce but she has noticed some swelling there.  She is interested in reassessment and possible repeat banding.  No bleeding. No Known Allergies Current Meds  Medication Sig   amLODipine (NORVASC) 5 MG tablet TAKE 1 TABLET(5 MG) BY MOUTH DAILY   aspirin 81 MG tablet Take 81 mg by mouth daily.   calcium citrate-vitamin D (CITRACAL+D) 315-200 MG-UNIT per tablet Take 1 tablet by mouth daily.   Carboxymethylcellulose Sod PF (RETAINE CMC) 0.5 % SOLN Apply to eye.   Cholecalciferol (VITAMIN D3) 1000 UNITS CAPS Take by mouth daily.   co-enzyme Q-10 30 MG capsule Take 50 mg by mouth daily.   FINACEA 15 % cream Apply 1 application topically 2 (two) times daily. For rosacea   hydrochlorothiazide (MICROZIDE) 12.5 MG capsule TAKE 1 CAPSULE(12.5 MG) BY MOUTH DAILY   losartan (COZAAR) 100 MG tablet TAKE 1 TABLET(100 MG) BY MOUTH DAILY   Misc Natural Products (TART CHERRY ADVANCED PO) Take 2,400 mg by mouth daily.   montelukast (SINGULAIR) 10 MG tablet TAKE  1 TABLET(10 MG) BY MOUTH AT BEDTIME   Multiple Vitamins-Minerals (MULTIVITAMIN WITH MINERALS) tablet Take 1 tablet by mouth daily.   OMEGA 3 1000 MG CAPS Take 1,000 mg by mouth daily.   pantoprazole (PROTONIX) 40 MG tablet Take 1 tablet (40 mg total) by mouth daily.   psyllium (METAMUCIL) 58.6 % powder Take 1 packet by mouth daily.   rosuvastatin (CRESTOR) 5 MG tablet TAKE 1 TABLET(5 MG) BY MOUTH DAILY   Past Medical History:  Diagnosis Date   Breast cancer [facility]) July 2012   post  double mastectomy; Dr Courtney [name]   Diverticulosis    Family history of breast cancer    GERD (gastroesophageal reflux disease)    S/P dilation X 1   Hemorrhoids    Hypertension    Night sweats    OSA (obstructive sleep apnea) 08/03/2017   Very mild with AHI 7.2/hr.     Prolapsed internal hemorrhoids, grade 3 06/27/2017   Grade 2 on anoscopy exam but grade 3 by history and seen a colonoscopy as well All 3 columns banded 06/27/2017  09/21/2017 banded RP and RA     Reactive airway disease    Past Surgical History:  Procedure Laterality Date   ABDOMINAL HYSTERECTOMY     bilateral breast reconstruction  Jan 2013   BREAST SURGERY  01/13/11   bilateral total mastectomy; Dr Courtney [name]   COLONOSCOPY  2014   Dr Courtney [name]; hyperplastic polyp   ESOPHAGEAL DILATION  2014   HEMORRHOID BANDING  2019   February and May   NASAL SEPTUM SURGERY     RECONSTRUCTION / CORRECTION OF NIPPLE / Courtney [name]  June 2013   THYROGLOSSAL DUCT CYST     Social History   Social History Narrative   Married   2-3 glasses wine/day   remote small vol smmoker   family history includes Breast cancer in her cousin and another family member; Breast cancer (age of onset: 22) in her mother; Cancer (age of onset: 40) in her sister; Cervical cancer (age of onset: 5) in her mother; Colon cancer (age of onset: 94) in her mother; Diabetes in her father; Heart attack in her maternal uncle, paternal grandfather, paternal uncle, and paternal uncle; Heart attack (age of onset: 50) in her maternal grandfather; Heart attack (age of onset: 40) in her father; Parkinson's disease in her maternal grandmother; Skin cancer in her father.   Review of Systems As per HPI.  Otherwise negative.  Objective:   Physical Exam '@BP'$  106/60   Pulse 69   Ht '4\' 8"'$  (1.422 m)   Wt 115 lb (52.2 kg)   BMI 25.78 kg/m @  General:  Well-developed, well-nourished and in no acute distress Eyes:  anicteric.  Lungs: Clear to auscultation bilaterally., somewhat  kyphotic Heart:   S1S2, no rubs, murmurs, gallops. Abdomen:  soft, non-tender, no hepatosplenomegaly, hernia, or mass and BS+.  Rectal:  Courtney [name], [place] present. Anoderm - NL - Gr 2 RP hemorrhoid w/ Valsalva  DRE NL  Anoscopy Gr 2 RP Internal hemorrhoid   Neuro:  A&O x 3.  Psych:  appropriate mood and  Affect.   Data Reviewed: See HPI   PROCEDURE NOTE: Hemorrhoid ligation The patient presents with symptomatic grade 2  hemorrhoids, requesting rubber band ligation of his/her hemorrhoidal disease.  All risks, benefits and alternative forms of therapy were described and informed consent was obtained.   The anorectum was pre-medicated with 0.125%NTG and 5% lidocaine The decision was made to band the RP internal hemorrhoid,  and the [place] was used to perform band ligation without complication.  Digital anorectal examination was then performed to assure proper positioning of the band, and to adjust the banded tissue as required.  The patient was discharged home without pain or other issues.  Dietary and behavioral recommendations were given and along with follow-up instructions.

## 2022-07-02 DIAGNOSIS — D131 Benign neoplasm of stomach: Secondary | ICD-10-CM

## 2022-07-02 HISTORY — DX: Benign neoplasm of stomach: D13.1

## 2022-07-08 ENCOUNTER — Telehealth: Payer: Medicare PPO | Admitting: Family Medicine

## 2022-07-08 DIAGNOSIS — Z0000 Encounter for general adult medical examination without abnormal findings: Secondary | ICD-10-CM

## 2022-07-08 NOTE — Patient Instructions (Addendum)
I really enjoyed getting to talk with you today! I am available on Tuesdays and Thursdays for virtual visits if you have any questions or concerns, or if I can be of any further assistance.   CHECKLIST FROM ANNUAL WELLNESS VISIT:  -Follow up (please call to schedule if not scheduled after visit):   -yearly for annual wellness visit with primary care office  Here is a list of your preventive care/health maintenance measures and the plan for each if any are due:  Health Maintenance  Topic Date Due   COVID-19 Vaccine (4 - 2023-24 season) 01/01/2022 - can get at pharmacy, due yearly   DTaP/Tdap/Td (2 - Td or Tdap) 02/17/2022 - can get at office or at the pharmacy   Medicare Annual Wellness ([place])  07/08/2023   Pneumonia Vaccine 45+ Years old  Completed   INFLUENZA VACCINE  Completed   DEXA SCAN  Completed   Hepatitis C Screening  Completed   Zoster Vaccines- Shingrix  Completed   HPV VACCINES  Aged Out   COLONOSCOPY (Pts 45-65yr Insurance coverage will need to be confirmed)  Discontinued    -See a dentist at least yearly  -Get your eyes checked and then per your eye specialist's recommendations  -Other issues addressed today:   -consider adding balance exercises 3 days per week - see below  -please limit alcohol intake to no more than one drink (one 5oz glass of wine) per 24 hour period -consider increasing unprocessed fresh (or frozen and then prepped) fruits and veggies in diet to at least 5 servings per day  -I have included below further information regarding a healthy whole foods based diet, physical activity guidelines for adults, stress management and opportunities for social connections. I hope you find this information useful.    -----------------------------------------------------------------------------------------------------------------------------------------------------------------------------------------------------------------------------------------------------------  NUTRITION: -eat real food: lots of colorful vegetables (half the plate) and fruits -5-7 servings of vegetables and fruits per day (fresh or steamed is best), exp. 2 servings of vegetables with lunch and dinner and 2 servings of fruit per day. Berries and greens such as kale and collards are great choices.  -consume on a regular basis: whole grains (make sure first ingredient on label contains the word "whole"), fresh fruits, fish, nuts, seeds, healthy oils (such as olive oil, avocado oil, grape seed oil) -may eat small amounts of dairy and lean meat on occasion, but avoid processed meats such as ham, bacon, lunch meat, etc. -drink water -try to avoid fast food and pre-packaged foods, processed meat -most experts advise limiting sodium to < '2300mg'$  per day, should limit further is any chronic conditions such as high blood pressure, heart disease, diabetes, etc. The American Heart Association advised that < '1500mg'$  is is ideal -try to avoid foods that contain any ingredients with names you do not recognize  -try to avoid sugar/sweets (except for the natural sugar that occurs in fresh fruit) -try to avoid sweet drinks -try to avoid white rice, white bread, pasta (unless whole grain), white or yellow potatoes  EXERCISE GUIDELINES FOR ADULTS: -if you wish to increase your physical activity, do so gradually and with the approval of your doctor -STOP and seek medical care immediately if you have any chest pain, chest discomfort or trouble breathing when starting or increasing exercise  -move and stretch your body, legs, feet and arms when sitting for long periods -Physical activity guidelines for optimal health in adults: -least 150 minutes per week of  aerobic exercise (can talk, but not sing) once approved by  your doctor, 20-30 minutes of sustained activity or two 10 minute episodes of sustained activity every day.  -resistance training at least 2 days per week if approved by your doctor -balance exercises 3+ days per week:   Stand somewhere where you have something sturdy to hold onto if you lose balance.    1) lift up on toes, start with 5x per day and work up to 20x   2) stand and lift on leg straight out to the side so that foot is a few inches of the floor, start with 5x each side and work up to 20x each side   3) stand on one foot, start with 5 seconds each side and work up to 20 seconds on each side  If you need ideas or help with getting more active:  -Silver sneakers https://tools.silversneakers.com  -Walk with a Doc: http://stephens-thompson.biz/  -try to include resistance (weight lifting/strength building) and balance exercises twice per week: or the following link for ideas: ChessContest.fr  UpdateClothing.com.cy  STRESS MANAGEMENT: -can try meditating, or just sitting quietly with deep breathing while intentionally relaxing all parts of your body for 5 minutes daily -if you need further help with stress, anxiety or depression please follow up with your primary doctor or contact the wonderful folks at [employer]: [place]: -options in [place] if you wish to engage in more social and exercise related activities:  -Silver sneakers https://tools.silversneakers.com  -Walk with a Doc: http://stephens-thompson.biz/  -Check out the [place] 50+ section on the [employer] (hiking clubs, book clubs, cards and games, chess, exercise classes, aquatic classes and much more) - see the website for  details: https://www.[place].gov/departments/parks-recreation/active-adults50  -YouTube has lots of exercise videos for different ages and abilities as well  -[place] (a variety of indoor and outdoor inperson activities for adults). (980)106-1724. [address]  -Virtual Online Classes (a variety of topics): see seniorplanet.org or call [phone]  -consider volunteering at a school, hospice center, church, senior center or elsewhere

## 2022-07-08 NOTE — Progress Notes (Signed)
PATIENT CHECK-IN and HEALTH RISK ASSESSMENT QUESTIONNAIRE:  -completed by phone/video for upcoming Medicare Preventive Visit  Pre-Visit Check-in: 1)Vitals (height, wt, BP, etc) - record in vitals section for visit on day of visit 2)Review and Update Medications, Allergies PMH, Surgeries, Social history in Epic 3)Hospitalizations in the last year with date/reason?  No 4)Review and Update Care Team (patient's specialists) in Epic 5) Complete PHQ9 in Epic  6) Complete Fall Screening in Epic 7)Review all Health Maintenance Due and order under PCP if not done.  8)Medicare Wellness Questionnaire: Answer theses question about your habits: Do you drink alcohol? Yes,  If yes, how many drinks do you have a day?Wine, daily Have you ever smoked?Yes,  Quit date if applicable?  quit in 1968 How many packs a day do/did you smoke? less than a pack Do you use smokeless tobacco?No Do you use an illicit drugs?No Do you exercises? Yes IF so, what type and how many days/minutes per week?water aerobics, walking.45 mins to 1 hour exercise for 6 days per week - uses dumbbells in the class Are you sexually active? Yes Number of partners?1 Typical breakfast: leftovers from [employer] Typical lunch: no lunch due to eating late breakfast Typical dinner:fish, seafood, vegetable, salad Typical snacks:crackers with cheese  Beverages: water, iced coffee, V8 juice  Answer theses question about you: Can you perform most household chores?Yes Do you find it hard to follow a conversation in a noisy room?No Do you often ask people to speak up or repeat themselves?No Do you feel that you have a problem with memory?No  Do you balance your checkbook and or bank acounts?No  Do you feel safe at home?Yes Last dentist visit? Feb 2024 Do you need assistance with any of the following: Please note if so No assistance needed with below  Driving?-  Feeding yourself?  Getting from bed to chair?  Getting to the toilet?  Bathing or  showering?  Dressing yourself?  Managing money?  Climbing a flight of stairs  Preparing meals?  Do you have Advanced Directives in place (Living Will, Healthcare Power or Attorney)? Yes   Last eye Exam and location?Sept 2023-[facility] in [place] you currently use prescribed or non-prescribed narcotic or opioid pain medications?No, Advil prn  Do you have a history or close family history of breast, ovarian, tubal or peritoneal cancer or a family member with BRCA (breast cancer susceptibility 1 and 2) gene mutations?  Nurse/Assistant Credentials/time stamp: St    ----------------------------------------------------------------------------------------------------------------------------------------------------------------------------------------------------------------------   MEDICARE ANNUAL PREVENTIVE VISIT WITH PROVIDER: (Welcome to [employer], initial annual wellness or annual wellness exam)  Virtual Visit via Phone Note  I connected with MAILAH RIERSON on 07/08/22 by a video enabled telemedicine application and verified that I am speaking with the correct person using two identifiers.  Location patient: home Location provider:work or home office Persons participating in the virtual visit: patient, provider  Concerns and/or follow up today: reports stable.   See HM section in Epic for other details of completed HM.    ROS: negative for report of fevers, unintentional weight loss, vision changes, vision loss, hearing loss or change, chest pain, sob, hemoptysis, melena, hematochezia, hematuria, genital discharge or lesions, falls, bleeding or bruising, loc, thoughts of suicide or self harm, memory loss  Patient-completed extensive health risk assessment - reviewed and discussed with the patient: See Health Risk Assessment completed with patient prior to the visit either above or in recent phone note. This was reviewed in detailed with the patient today  and  appropriate recommendations, orders and referrals were placed as needed per Summary below and patient instructions.   Review of Medical History: -PMH, [place]ina, Family History and current specialty and care providers reviewed and updated and listed below   Patient Care Team: [name], [name], MD as PCP - General (Family Medicine) Magrinat, Virgie Dad, MD (Inactive) as Consulting Physician (Oncology) Delice Bison, Charlestine Massed, NP as Nurse Practitioner (Hematology and Oncology) Rolm Bookbinder, MD as Consulting Physician (Dermatology)   Past Medical History:  Diagnosis Date   Breast cancer [facility]) July 2012   post double mastectomy; Dr Humphrey Rolls   Diverticulosis    Family history of breast cancer    GERD (gastroesophageal reflux disease)    S/P dilation X 1   Hemorrhoids    Hypertension    Night sweats    OSA (obstructive sleep apnea) 08/03/2017   Very mild with AHI 7.2/hr.     Prolapsed internal hemorrhoids, grade 3 06/27/2017   Grade 2 on anoscopy exam but grade 3 by history and seen a colonoscopy as well All 3 columns banded 06/27/2017  09/21/2017 banded RP and RA     Reactive airway disease     Past Surgical History:  Procedure Laterality Date   ABDOMINAL HYSTERECTOMY     bilateral breast reconstruction  Jan 2013   BREAST SURGERY  01/13/11   bilateral total mastectomy; Dr Dalbert Batman   COLONOSCOPY  2014   Dr Olevia Perches; hyperplastic polyp   ESOPHAGEAL DILATION  2014   HEMORRHOID BANDING  2019   February and May   NASAL SEPTUM SURGERY     RECONSTRUCTION / CORRECTION OF NIPPLE / Tresa Moore  June 2013   THYROGLOSSAL DUCT CYST      Social History   Socioeconomic History   Marital status: Married    Spouse name: Not on file   Number of children: Not on file   Years of education: Not on file   Highest education level: Bachelor's degree (e.g., BA, AB, BS)  Occupational History   Not on file  Tobacco Use   Smoking status: Former    Years: 5.00    Types: Cigarettes    Quit date: 05/03/1965    Years  since quitting: 57.2   Smokeless tobacco: Never   Tobacco comments:    smoked 1962-1967, less than 1 ppd  Vaping Use   Vaping Use: Never used  Substance and Sexual Activity   Alcohol use: Yes    Alcohol/week: 14.0 standard drinks of alcohol    Types: 14 Glasses of wine per week    Comment: 2-3 /day   Drug use: No   Sexual activity: Yes  Other Topics Concern   Not on file  Social History Narrative   Married   2-3 glasses wine/day   remote small vol smmoker   Social Determinants of Health   Financial Resource Strain: Low Risk  (07/06/2021)   Overall Financial Resource Strain (CARDIA)    Difficulty of Paying Living Expenses: Not hard at all  Food Insecurity: No Food Insecurity (07/06/2021)   Hunger Vital Sign    Worried About Running Out of Food in the Last Year: Never true    Ran Out of Food in the Last Year: Never true  Transportation Needs: No Transportation Needs (07/06/2021)   PRAPARE - Hydrologist (Medical): No    Lack of Transportation (Non-Medical): No  Physical Activity: Sufficiently Active (07/06/2021)   Exercise Vital Sign    Days of Exercise per  Week: 6 days    Minutes of Exercise per Session: 60 min  Stress: No Stress Concern Present (07/06/2021)   [place]    Feeling of Stress : Not at all  Social Connections: [place] (07/06/2021)   Social Connection and Isolation Panel [NHANES]    Frequency of Communication with Friends and Family: More than three times a week    Frequency of Social Gatherings with Friends and Family: More than three times a week    Attends Religious Services: More than 4 times per year    Active Member of [employer] or Organizations: Yes    Attends Music therapist: More than 4 times per year    Marital Status: Married  Human resources officer Violence: Not At Risk (07/06/2021)   Humiliation, Afraid, Rape, and Kick questionnaire    Fear of  Current or Ex-Partner: No    Emotionally Abused: No    Physically Abused: No    Sexually Abused: No    Family History  Problem Relation Age of Onset   Breast cancer Mother 35   Colon cancer Mother 95   Cervical cancer Mother 42       s/p hysterectomy   Heart attack Father 65   Diabetes Father        probable pre DM   Skin cancer Father        maybe melanoma   Cancer Sister 64       appendiceal   Heart attack Maternal Grandfather 57   Heart attack Paternal Uncle        X2 ; >50y   Heart attack Maternal Uncle        d. 12   Parkinson's disease Maternal Grandmother        d. 76   Heart attack Paternal Grandfather    Breast cancer Other        unspecified age; maternal great aunt (MGM's sister)   Breast cancer Cousin        maternal 1st cousin, once-removed; unspecified age   Heart attack Paternal Uncle        >50y   Stroke Neg Hx     Current Outpatient Medications on File Prior to Visit  Medication Sig Dispense Refill   albuterol (VENTOLIN HFA) 108 (90 Base) MCG/ACT inhaler Inhale 2 puffs into the lungs every 6 (six) hours as needed for wheezing or shortness of breath. (Patient not taking: Reported on 06/10/2022) 8 g 3   amLODipine (NORVASC) 5 MG tablet TAKE 1 TABLET(5 MG) BY MOUTH DAILY 90 tablet 2   aspirin 81 MG tablet Take 81 mg by mouth daily.     budesonide-formoterol (SYMBICORT) 160-4.5 MCG/ACT inhaler INHALE 1 TO 2 PUFFS EVERY 12 HOURS AS NEEDED. GARGLE AND SPIT AFTER USE (Patient not taking: Reported on 06/10/2022) 10.2 g 11   calcium citrate-vitamin D (CITRACAL+D) 315-200 MG-UNIT per tablet Take 1 tablet by mouth daily.     Carboxymethylcellulose Sod PF (RETAINE CMC) 0.5 % SOLN Apply to eye.     Cholecalciferol (VITAMIN D3) 1000 UNITS CAPS Take by mouth daily.     co-enzyme Q-10 30 MG capsule Take 50 mg by mouth daily.     FINACEA 15 % cream Apply 1 application topically 2 (two) times daily. For rosacea  0   hydrochlorothiazide (MICROZIDE) 12.5 MG capsule TAKE 1  CAPSULE(12.5 MG) BY MOUTH DAILY 90 capsule 2   losartan (COZAAR) 100 MG tablet TAKE 1 TABLET(100 MG) BY MOUTH  DAILY 90 tablet 2   Misc Natural Products (TART CHERRY ADVANCED PO) Take 2,400 mg by mouth daily.     montelukast (SINGULAIR) 10 MG tablet TAKE 1 TABLET(10 MG) BY MOUTH AT BEDTIME 90 tablet 3   Multiple Vitamins-Minerals (MULTIVITAMIN WITH MINERALS) tablet Take 1 tablet by mouth daily.     OMEGA 3 1000 MG CAPS Take 1,000 mg by mouth daily.     pantoprazole (PROTONIX) 40 MG tablet Take 1 tablet (40 mg total) by mouth daily. 90 tablet 2   psyllium (METAMUCIL) 58.6 % powder Take 1 packet by mouth daily.     rosuvastatin (CRESTOR) 5 MG tablet TAKE 1 TABLET(5 MG) BY MOUTH DAILY 90 tablet 1   No current facility-administered medications on file prior to visit.    No Known Allergies     Physical Exam There were no vitals filed for this visit. Estimated body mass index is 25.78 kg/m as calculated from the following:   Height as of 06/10/22: '4\' 8"'$  (1.422 m).   Weight as of 06/10/22: 115 lb (52.2 kg).  EKG (optional): deferred due to virtual visit  GENERAL: alert, oriented, no acute distress detected, full vision exam deferred due to pandemic and/or virtual encounter   HEENT: atraumatic, conjunttiva clear, no obvious abnormalities on inspection of external nose and ears  NECK: normal movements of the head and neck  LUNGS: on inspection no signs of respiratory distress, breathing rate appears normal, no obvious gross SOB, gasping or wheezing  CV: no obvious cyanosis  MS: moves all visible extremities without noticeable abnormality  PSYCH/NEURO: pleasant and cooperative, no obvious depression or anxiety, speech and thought processing grossly intact, Cognitive function grossly intact  Flowsheet Row Video Visit from 07/08/2022 in [facility]  PHQ-9 Total Score 1           07/08/2022    9:46 AM 07/08/2022    9:45 AM 01/26/2022    2:39 PM 11/17/2021    10:03 AM 07/06/2021    9:10 AM  Depression screen PHQ 2/9  Decreased Interest 0 0 0 0 0  Down, Depressed, Hopeless 0 0 0 0 0  PHQ - 2 Score 0 0 0 0 0  Altered sleeping 1      Tired, decreased energy 0      Change in appetite 0      Feeling bad or failure about yourself  0      Trouble concentrating 0      Moving slowly or fidgety/restless 0      Suicidal thoughts 0      PHQ-9 Score 1      Difficult doing work/chores Not difficult at all           07/06/2021    9:14 AM 11/17/2021   10:03 AM 01/26/2022    2:39 PM 02/04/2022   11:00 AM 07/08/2022    9:45 AM  [place] in the past year? 0 0 0  0  Was there an injury with Fall? 0 0 0  0  Fall Risk Category Calculator 0 0 0  0  Fall Risk Category (Retired) Low Low Low    (RETIRED) Patient Fall Risk Level Low fall risk Low fall risk Low fall risk Low fall risk   Patient at Risk for Falls Due to No Fall Risks No Fall Risks No Fall Risks    Fall risk Follow up  Falls evaluation completed Falls evaluation completed  SUMMARY AND PLAN:  Encounter for Medicare annual wellness exam    Discussed applicable health maintenance/preventive health measures and advised and referred or ordered per patient preferences:  Health Maintenance  Topic Date Due   COVID-19 Vaccine (4 - 2023-24 season) 01/01/2022, considering and plans to get soon at pharmacy   DTaP/Tdap/Td (2 - Td or Tdap) 02/17/2022, plans to get next time she is in the office   Medicare Annual Wellness (AWV)  07/07/2022   Pneumonia Vaccine 53+ Years old  Completed   INFLUENZA VACCINE  Completed   DEXA SCAN  Completed   Hepatitis C Screening  Completed   Zoster Vaccines- Shingrix  Completed   HPV VACCINES  Aged Out   COLONOSCOPY (Pts 45-51yr Insurance coverage will need to be confirmed)  Discontinued  Had Breast MRI in December and reports was good.   Education and counseling on the following was provided based on the above review of health and a plan/checklist for the  patient, along with additional information discussed, was provided for the patient in the patient instructions :   -congratulated on fairly healthy diet and regular exercise!  -discussed considering addition of balance exercises 3 x per week and discussed safe balance exercises --dicussed considering increasing servings of whole food in diet, particularly fruit and veggies servings -Advise yearly dental visits at minimum and regular eye exams -Advised and counseled on alcohol use and advised no more than one 5oz glass of wine per day Follow up: see patient instructions     Patient Instructions  I really enjoyed getting to talk with you today! I am available on Tuesdays and Thursdays for virtual visits if you have any questions or concerns, or if I can be of any further assistance.   CHECKLIST FROM ANNUAL WELLNESS VISIT:  -Follow up (please call to schedule if not scheduled after visit):   -yearly for annual wellness visit with primary care office  Here is a list of your preventive care/health maintenance measures and the plan for each if any are due:  Health Maintenance  Topic Date Due   COVID-19 Vaccine (4 - 2023-24 season) 01/01/2022 - can get at pharmacy, due yearly   DTaP/Tdap/Td (2 - Td or Tdap) 02/17/2022 - can get at office or at the pharmacy   Medicare Annual Wellness ([place]  07/08/2023   Pneumonia Vaccine 77 Years old  Completed   INFLUENZA VACCINE  Completed   DEXA SCAN  Completed   Hepatitis C Screening  Completed   Zoster Vaccines- Shingrix  Completed   HPV VACCINES  Aged Out   COLONOSCOPY (Pts 45-468yrInsurance coverage will need to be confirmed)  Discontinued    -See a dentist at least yearly  -Get your eyes checked and then per your eye specialist's recommendations  -Other issues addressed today:   -consider adding balance exercises 3 days per week - see below  -please limit alcohol intake to no more than one drink (one 5oz glass of wine) per 24 hour  period -consider increasing unprocessed fresh (or frozen and then prepped) fruits and veggies in diet to at least 5 servings per day  -I have included below further information regarding a healthy whole foods based diet, physical activity guidelines for adults, stress management and opportunities for social connections. I hope you find this information useful.   -----------------------------------------------------------------------------------------------------------------------------------------------------------------------------------------------------------------------------------------------------------  NUTRITION: -eat real food: lots of colorful vegetables (half the plate) and fruits -5-7 servings of vegetables and fruits per day (fresh or steamed is best), exp. 2  servings of vegetables with lunch and dinner and 2 servings of fruit per day. Berries and greens such as kale and collards are great choices.  -consume on a regular basis: whole grains (make sure first ingredient on label contains the word "whole"), fresh fruits, fish, nuts, seeds, healthy oils (such as olive oil, avocado oil, grape seed oil) -may eat small amounts of dairy and lean meat on occasion, but avoid processed meats such as ham, bacon, lunch meat, etc. -drink water -try to avoid fast food and pre-packaged foods, processed meat -most experts advise limiting sodium to < '2300mg'$  per day, should limit further is any chronic conditions such as high blood pressure, heart disease, diabetes, etc. The American Heart Association advised that < '1500mg'$  is is ideal -try to avoid foods that contain any ingredients with names you do not recognize  -try to avoid sugar/sweets (except for the natural sugar that occurs in fresh fruit) -try to avoid sweet drinks -try to avoid white rice, white bread, pasta (unless whole grain), white or yellow potatoes  EXERCISE GUIDELINES FOR ADULTS: -if you wish to increase your physical activity, do so  gradually and with the approval of your doctor -STOP and seek medical care immediately if you have any chest pain, chest discomfort or trouble breathing when starting or increasing exercise  -move and stretch your body, legs, feet and arms when sitting for long periods -Physical activity guidelines for optimal health in adults: -least 150 minutes per week of aerobic exercise (can talk, but not sing) once approved by your doctor, 20-30 minutes of sustained activity or two 10 minute episodes of sustained activity every day.  -resistance training at least 2 days per week if approved by your doctor -balance exercises 3+ days per week:   Stand somewhere where you have something sturdy to hold onto if you lose balance.    1) lift up on toes, start with 5x per day and work up to 20x   2) stand and lift on leg straight out to the side so that foot is a few inches of the floor, start with 5x each side and work up to 20x each side   3) stand on one foot, start with 5 seconds each side and work up to 20 seconds on each side  If you need ideas or help with getting more active:  -Silver sneakers https://tools.silversneakers.com  -Walk with a Doc: http://stephens-thompson.biz/  -try to include resistance (weight lifting/strength building) and balance exercises twice per week: or the following link for ideas: ChessContest.fr  UpdateClothing.com.cy  STRESS MANAGEMENT: -can try meditating, or just sitting quietly with deep breathing while intentionally relaxing all parts of your body for 5 minutes daily -if you need further help with stress, anxiety or depression please follow up with your primary doctor or contact the wonderful folks at [place]: [place]: -options in [place] if you wish to engage in more social and exercise related activities:  -Silver  sneakers https://tools.silversneakers.com  -Walk with a Doc: http://stephens-thompson.biz/  -Check out the [employer] 50+ section on the [place] [employer] (hiking clubs, book clubs, cards and games, chess, exercise classes, aquatic classes and much more) - see the website for details: https://www.[place].gov/departments/parks-recreation/active-adults50  -YouTube has lots of exercise videos for different ages and abilities as well  -[employer] (a variety of indoor and outdoor inperson activities for adults). ([phone]. [address].  -Virtual Online Classes (a variety of topics): see seniorplanet.org or call [phone]  -  consider volunteering at a school, hospice center, church, senior center or elsewhere           Lucretia Kern, DO

## 2022-07-21 ENCOUNTER — Telehealth: Payer: Self-pay | Admitting: Internal Medicine

## 2022-07-21 ENCOUNTER — Telehealth (INDEPENDENT_AMBULATORY_CARE_PROVIDER_SITE_OTHER): Payer: Medicare PPO | Admitting: Family Medicine

## 2022-07-21 ENCOUNTER — Encounter: Payer: Self-pay | Admitting: Family Medicine

## 2022-07-21 VITALS — Ht <= 58 in

## 2022-07-21 DIAGNOSIS — J4521 Mild intermittent asthma with (acute) exacerbation: Secondary | ICD-10-CM

## 2022-07-21 DIAGNOSIS — R051 Acute cough: Secondary | ICD-10-CM | POA: Diagnosis not present

## 2022-07-21 MED ORDER — PREDNISONE 20 MG PO TABS: 40.0000 mg | ORAL_TABLET | Freq: Every day | ORAL | 0 refills | Status: AC

## 2022-07-21 MED ORDER — HYDROCODONE BIT-HOMATROP MBR 5-1.5 MG/5ML PO SOLN: 5.0000 mL | Freq: Two times a day (BID) | ORAL | 0 refills | Status: AC | PRN

## 2022-07-21 MED ORDER — BUDESONIDE-FORMOTEROL FUMARATE 160-4.5 MCG/ACT IN AERO: INHALATION_SPRAY | RESPIRATORY_TRACT | 11 refills | Status: DC

## 2022-07-21 NOTE — Telephone Encounter (Signed)
Inbound call from patient requesting to speak with a nurse in regards her up coming appointment 3/22.Please advise

## 2022-07-21 NOTE — Telephone Encounter (Signed)
Inbound call from patent returning call pease advise.

## 2022-07-21 NOTE — Progress Notes (Signed)
Virtual Visit via Video Note I connected with Courtney [name] on 07/21/22 by a video enabled telemedicine application and verified that I am speaking with the correct person using two identifiers. Location patient: home Location provider:work office Persons participating in the virtual visit: patient, provider  I discussed the limitations of evaluation and management by telemedicine and the availability of in person appointments. The patient expressed understanding and agreed to proceed.  Chief Complaint  Patient presents with   Cough    Started 3/19, requesting refill on Hycodan to help with sleep.   HPI: Courtney [name] is a 77 year old female with history of asthma, hypertension, OSA,GERD, and breast cancer s/p double mastectomy (2012) complaining of nonproductive cough for the past 1 to 2 days and requesting a prescription for Hycodan. She has taking Hycodan before to manage cough, she denies any side effects and it has helped her sleep better. She has intermittent episodes of wheezing, denies CP,orthopnea,PND, or dyspnea. Cough has been disrupting her sleep.  She also reports rhinorrhea and nasal congestion.  Negative for fever,chills,body aches,abdominal pain,nausea,vomiting,urinary symptoms,or skin rash. No sick contact or recent travel.  Asthma: She is on Symbicort 160-4.5 mcg bid prn. Exacerbations typically occurring around this time of year. She recently initiated Symbicort, administered twice daily, has not used Albuterol inh recently. She has not required prednisone for approximately one year.  ROS: See pertinent positives and negatives per HPI.  Past Medical History:  Diagnosis Date   Breast cancer [facility]) July 2012   post double mastectomy; Dr Humphrey Rolls   Diverticulosis    Family history of breast cancer    GERD (gastroesophageal reflux disease)    S/P dilation X 1   Hemorrhoids    Hypertension    Night sweats    OSA (obstructive sleep apnea) 08/03/2017   Very mild with AHI  7.2/hr.     Prolapsed internal hemorrhoids, grade 3 06/27/2017   Grade 2 on anoscopy exam but grade 3 by history and seen a colonoscopy as well All 3 columns banded 06/27/2017  09/21/2017 banded RP and RA     Reactive airway disease    Past Surgical History:  Procedure Laterality Date   ABDOMINAL HYSTERECTOMY     bilateral breast reconstruction  Jan 2013   BREAST SURGERY  01/13/11   bilateral total mastectomy; Dr Dalbert Batman   COLONOSCOPY  2014   Dr Olevia Perches; hyperplastic polyp   ESOPHAGEAL DILATION  2014   HEMORRHOID BANDING  2019   February and May   NASAL SEPTUM SURGERY     RECONSTRUCTION / [place] / Tresa Moore  June 2013   THYROGLOSSAL DUCT CYST     Family History  Problem Relation Age of Onset   Breast cancer Mother 70   Colon cancer Mother 40   Cervical cancer Mother 1       s/p hysterectomy   Heart attack Father 10   Diabetes Father        probable pre DM   Skin cancer Father        maybe melanoma   Cancer Sister 65       appendiceal   Heart attack Maternal Grandfather 57   Heart attack Paternal Uncle        X2 ; >50y   Heart attack Maternal Uncle        d. 23   Parkinson's disease Maternal Grandmother        d. 74   Heart attack Paternal Grandfather    Breast cancer Other  unspecified age; maternal great aunt (MGM's sister)   Breast cancer Cousin        maternal 1st cousin, once-removed; unspecified age   Heart attack Paternal Uncle        >50y   Stroke Neg Hx    Social History   Socioeconomic History   Marital status: Married    Spouse name: Not on file   Number of children: Not on file   Years of education: Not on file   Highest education level: Bachelor's degree (e.g., BA, AB, BS)  Occupational History   Not on file  Tobacco Use   Smoking status: Former    Years: 5    Types: Cigarettes    Quit date: 05/03/1965    Years since quitting: 57.2   Smokeless tobacco: Never   Tobacco comments:    smoked 1962-1967, less than 1 ppd  Vaping Use    Vaping Use: Never used  Substance and Sexual Activity   Alcohol use: Yes    Alcohol/week: 14.0 standard drinks of alcohol    Types: 14 Glasses of wine per week    Comment: 2-3 /day   Drug use: No   Sexual activity: Yes  Other Topics Concern   Not on file  Social History Narrative   Married   2-3 glasses wine/day   remote small vol smmoker   Social Determinants of Health   Financial Resource Strain: Low Risk  (07/06/2021)   Overall Financial Resource Strain (CARDIA)    Difficulty of Paying Living Expenses: Not hard at all  Food Insecurity: No Food Insecurity (07/06/2021)   Hunger Vital Sign    Worried About Running Out of Food in the Last Year: Never true    Ran Out of Food in the Last Year: Never true  Transportation Needs: No Transportation Needs (07/06/2021)   PRAPARE - Hydrologist (Medical): No    Lack of Transportation (Non-Medical): No  Physical Activity: Sufficiently Active (07/06/2021)   Exercise Vital Sign    Days of Exercise per Week: 6 days    Minutes of Exercise per Session: 60 min  Stress: No Stress Concern Present (07/06/2021)   [place]    Feeling of Stress : Not at all  Social Connections: [place] (07/06/2021)   Social Connection and Isolation Panel [NHANES]    Frequency of Communication with Friends and Family: More than three times a week    Frequency of Social Gatherings with Friends and Family: More than three times a week    Attends Religious Services: More than 4 times per year    Active Member of [employer] or Organizations: Yes    Attends Music therapist: More than 4 times per year    Marital Status: Married  Human resources officer Violence: Not At Risk (07/06/2021)   Humiliation, Afraid, Rape, and Kick questionnaire    Fear of Current or Ex-Partner: No    Emotionally Abused: No    Physically Abused: No    Sexually Abused: No    Current  Outpatient Medications:    albuterol (VENTOLIN HFA) 108 (90 Base) MCG/ACT inhaler, Inhale 2 puffs into the lungs every 6 (six) hours as needed for wheezing or shortness of breath., Disp: 8 g, Rfl: 3   amLODipine (NORVASC) 5 MG tablet, TAKE 1 TABLET(5 MG) BY MOUTH DAILY, Disp: 90 tablet, Rfl: 2   aspirin 81 MG tablet, Take 81 mg by mouth daily., Disp: , Rfl:  calcium citrate-vitamin D (CITRACAL+D) 315-200 MG-UNIT per tablet, Take 1 tablet by mouth daily., Disp: , Rfl:    Carboxymethylcellulose Sod PF (RETAINE CMC) 0.5 % SOLN, Apply to eye., Disp: , Rfl:    Cholecalciferol (VITAMIN D3) 1000 UNITS CAPS, Take by mouth daily., Disp: , Rfl:    co-enzyme Q-10 30 MG capsule, Take 50 mg by mouth daily., Disp: , Rfl:    FINACEA 15 % cream, Apply 1 application topically 2 (two) times daily. For rosacea, Disp: , Rfl: 0   hydrochlorothiazide (MICROZIDE) 12.5 MG capsule, TAKE 1 CAPSULE(12.5 MG) BY MOUTH DAILY, Disp: 90 capsule, Rfl: 2   HYDROcodone bit-homatropine (HYCODAN) 5-1.5 MG/5ML syrup, Take 5 mLs by mouth 2 (two) times daily as needed for up to 10 days for cough., Disp: 80 mL, Rfl: 0   losartan (COZAAR) 100 MG tablet, TAKE 1 TABLET(100 MG) BY MOUTH DAILY, Disp: 90 tablet, Rfl: 2   Misc Natural Products (TART CHERRY ADVANCED PO), Take 2,400 mg by mouth daily., Disp: , Rfl:    montelukast (SINGULAIR) 10 MG tablet, TAKE 1 TABLET(10 MG) BY MOUTH AT BEDTIME, Disp: 90 tablet, Rfl: 3   Multiple Vitamins-Minerals (MULTIVITAMIN WITH MINERALS) tablet, Take 1 tablet by mouth daily., Disp: , Rfl:    OMEGA 3 1000 MG CAPS, Take 1,000 mg by mouth daily., Disp: , Rfl:    pantoprazole (PROTONIX) 40 MG tablet, Take 1 tablet (40 mg total) by mouth daily., Disp: 90 tablet, Rfl: 2   predniSONE (DELTASONE) 20 MG tablet, Take 2 tablets (40 mg total) by mouth daily with breakfast for 5 days., Disp: 10 tablet, Rfl: 0   psyllium (METAMUCIL) 58.6 % powder, Take 1 packet by mouth daily., Disp: , Rfl:    rosuvastatin (CRESTOR) 5  MG tablet, TAKE 1 TABLET(5 MG) BY MOUTH DAILY, Disp: 90 tablet, Rfl: 1   budesonide-formoterol (SYMBICORT) 160-4.5 MCG/ACT inhaler, INHALE 1 TO 2 PUFFS EVERY 12 HOURS AS NEEDED. GARGLE AND SPIT AFTER USE, Disp: 10.2 g, Rfl: 11  EXAM:  VITALS per patient if applicable:Ht 4\' 8"  (1.422 m)   BMI 25.78 kg/m   GENERAL: alert, oriented, appears well and in no acute distress  HEENT: atraumatic, conjunctiva clear, no obvious abnormalities on inspection of external nose and ears  NECK: normal movements of the head and neck  LUNGS: on inspection no signs of respiratory distress, breathing rate appears normal, no obvious gross SOB, or gasping.  + Wheezing.  CV: no obvious cyanosis  MS: moves all visible extremities without noticeable abnormality  PSYCH/NEURO: pleasant and cooperative, no obvious depression or anxiety, speech and thought processing grossly intact  ASSESSMENT AND PLAN:  Discussed the following assessment and plan:  Acute cough Most likely caused by asthma exacerbation and allergies. Requesting Rx for Hycodan, which has helped with cough at night. We discussed side effects. I do not think imaging is needed at this time.  -     HYDROcodone Bit-Homatrop MBr; Take 5 mLs by mouth 2 (two) times daily as needed for up to 10 days for cough.  Dispense: 80 mL; Refill: 0  Mild intermittent asthma with acute exacerbation She has tolerated Prednisone well in the past, some side effects discussed. Prednisone x 3-5 days with breakfast. Albuterol inh 2 puff every 6 hours for a week then as needed for wheezing or shortness of breath.  Continue Symbicort 160-4.5 2 puff bid, rinse after use, she can change to prn in 4-5 months. Next year, recommend  starting Symbicort 3-4 weeks before Spring. Instructed  about warning signs.  -     predniSONE; Take 2 tablets (40 mg total) by mouth daily with breakfast for 5 days.  Dispense: 10 tablet; Refill: 0 -     Budesonide-Formoterol Fumarate; INHALE  1 TO 2 PUFFS EVERY 12 HOURS AS NEEDED. GARGLE AND SPIT AFTER USE  Dispense: 10.2 g; Refill: 11  We discussed possible serious and likely etiologies, options for evaluation and workup, limitations of telemedicine visit vs in person visit, treatment, treatment risks and precautions. The patient was advised to call back or seek an in-person evaluation if the symptoms worsen or if the condition fails to improve as anticipated. I discussed the assessment and treatment plan with the patient. The patient was provided an opportunity to ask questions and all were answered. The patient agreed with the plan and demonstrated an understanding of the instructions.  Return if symptoms worsen or fail to improve, for keep next appointment. Shamarr Faucett G. [name], MD  [facility]. [place] office.

## 2022-07-21 NOTE — Telephone Encounter (Signed)
Left message for pt to call back  °

## 2022-07-22 NOTE — Telephone Encounter (Signed)
Pt stated that she had a cold and cough earlier in the week but feels a lot better today. Only coughed once last night. No Fever.Pt questioned if she could proceed with her procedure tomorrow.  Pt was instructed to take a Covid test today to ensure that she is negative. Pt stated that she would take the Covid test. Pt was notified that if the test was negative and she continues to feel better then she should proceed with the procedure.  Pt verbalized understanding with all questions answered.

## 2022-07-23 ENCOUNTER — Ambulatory Visit (AMBULATORY_SURGERY_CENTER): Payer: Medicare PPO | Admitting: Internal Medicine

## 2022-07-23 ENCOUNTER — Encounter: Payer: Self-pay | Admitting: Internal Medicine

## 2022-07-23 VITALS — BP 110/65 | HR 66 | Temp 97.8°F | Resp 19 | Ht <= 58 in | Wt 115.0 lb

## 2022-07-23 DIAGNOSIS — G4733 Obstructive sleep apnea (adult) (pediatric): Secondary | ICD-10-CM | POA: Diagnosis not present

## 2022-07-23 DIAGNOSIS — K222 Esophageal obstruction: Secondary | ICD-10-CM | POA: Diagnosis not present

## 2022-07-23 DIAGNOSIS — I10 Essential (primary) hypertension: Secondary | ICD-10-CM | POA: Diagnosis not present

## 2022-07-23 DIAGNOSIS — K219 Gastro-esophageal reflux disease without esophagitis: Secondary | ICD-10-CM

## 2022-07-23 DIAGNOSIS — Z1211 Encounter for screening for malignant neoplasm of colon: Secondary | ICD-10-CM | POA: Diagnosis not present

## 2022-07-23 DIAGNOSIS — Z800 Family history of malignant neoplasm of digestive organs: Secondary | ICD-10-CM | POA: Diagnosis not present

## 2022-07-23 DIAGNOSIS — K317 Polyp of stomach and duodenum: Secondary | ICD-10-CM | POA: Diagnosis not present

## 2022-07-23 DIAGNOSIS — K449 Diaphragmatic hernia without obstruction or gangrene: Secondary | ICD-10-CM | POA: Diagnosis not present

## 2022-07-23 DIAGNOSIS — D122 Benign neoplasm of ascending colon: Secondary | ICD-10-CM | POA: Diagnosis not present

## 2022-07-23 MED ORDER — NYSTATIN 100000 UNIT/ML MT SUSP: 5.0000 mL | Freq: Four times a day (QID) | OROMUCOSAL | 0 refills | Status: DC

## 2022-07-23 MED ORDER — SODIUM CHLORIDE 0.9 % IV SOLN: 500.0000 mL | Freq: Once | INTRAVENOUS | Status: DC

## 2022-07-23 MED ORDER — AMOXICILLIN-POT CLAVULANATE 875-125 MG PO TABS: 1.0000 | ORAL_TABLET | Freq: Two times a day (BID) | ORAL | 0 refills | Status: DC

## 2022-07-23 NOTE — Progress Notes (Signed)
Called to room to assist during endoscopic procedure.  Patient ID and intended procedure confirmed with present staff. Received instructions for my participation in the procedure from the performing physician.  

## 2022-07-23 NOTE — Progress Notes (Signed)
Pt's states no medical or surgical changes since previsit or office visit. 

## 2022-07-23 NOTE — Op Note (Signed)
[name] Patient Name: Courtney [name] Procedure Date: 07/23/2022 11:07 AM MRN: [id number] Endoscopist: Gatha Mayer , MD, [id number] Age: 77 Referring MD:  Date of Birth: 1945-12-23 Gender: Female Account #: [id number] Procedure:                Upper GI endoscopy Indications:              Dysphagia, Heartburn Medicines:                Monitored Anesthesia Care Procedure:                Pre-Anesthesia Assessment:                           - Prior to the procedure, a History and Physical                            was performed, and patient medications and                            allergies were reviewed. The patient's tolerance of                            previous anesthesia was also reviewed. The risks                            and benefits of the procedure and the sedation                            options and risks were discussed with the patient.                            All questions were answered, and informed consent                            was obtained. Prior Anticoagulants: The patient has                            taken no anticoagulant or antiplatelet agents. ASA                            Grade Assessment: II - A patient with mild systemic                            disease. After reviewing the risks and benefits,                            the patient was deemed in satisfactory condition to                            undergo the procedure.                           After obtaining informed consent, the endoscope was  passed under direct vision. Throughout the                            procedure, the patient's blood pressure, pulse, and                            oxygen saturations were monitored continuously. The                            GIF [id number] was introduced through the                            mouth, and advanced to the second part of duodenum.                            The upper GI endoscopy was  accomplished without                            difficulty. The patient tolerated the procedure. Scope In: Scope Out: Findings:                 One benign-appearing, intrinsic moderate                            (circumferential scarring or stenosis; an endoscope                            may pass) stenosis was found at the                            gastroesophageal junction. The stenosis was                            traversed. A TTS dilator was passed through the                            scope. Dilation with an 18-19-20 mm balloon dilator                            was performed to 18 mm. The dilation site was                            examined and showed mild mucosal disruption.                            Estimated blood loss was minimal.                           A 5 cm hiatal hernia was present.                           The gastroesophageal flap valve was visualized                            endoscopically and  classified as Hill Grade IV (no                            fold, wide open lumen, hiatal hernia present).                           Multiple diminutive sessile polyps were found in                            the gastric body. Biopsies were taken with a cold                            forceps for histology. Verification of patient                            identification for the specimen was done. Estimated                            blood loss was minimal.                           The exam was otherwise without abnormality.                           The cardia and gastric fundus were otherwise normal                            on retroflexion. Complications:            No immediate complications. Estimated Blood Loss:     Estimated blood loss was minimal. Impression:               - Benign-appearing esophageal stenosis. Dilated.                           - 5 cm hiatal hernia.                           - Gastroesophageal flap valve classified as Hill                             Grade IV (no fold, wide open lumen, hiatal hernia                            present).                           - Multiple gastric polyps. Biopsied.                           - The examination was otherwise normal. Recommendation:           - Patient has a contact number available for                            emergencies. The signs and symptoms of potential  delayed complications were discussed with the                            patient. Return to normal activities tomorrow.                            Written discharge instructions were provided to the                            patient.                           - See the other procedure note for documentation of                            additional recommendations.                           - Clear liquids x 1 hour then soft foods rest of                            day. Start prior diet tomorrow.                           - Continue present medications.                           - Await pathology results.                           - also had some tongue thrush - on prednisone for a                            "cold" had rhinorrhea and some regurgitation                            immediately after preocedure (and during                            colonoscopy) nystatin Rx Gatha Mayer, MD 07/23/2022 11:49:34 AM This report has been signed electronically.

## 2022-07-23 NOTE — Progress Notes (Signed)
Pt alert and awake in recovery.  She can cough and swallow on command.  BS more noisy in R lower lobe.  Talked to Dr Darnell Level about possible aspiration.  He said he would put her on antibiotic just in case.  Recommended to Janett Billow RN that she uses her inhalers again ASAP

## 2022-07-23 NOTE — Progress Notes (Signed)
[facility] History and Physical   Primary Care Physician:  [name], [name], MD   Reason for Procedure:   Dysphagia + GERD, FHx CRCA  Plan:    EGD,esophageal dilation,  colonoscopy     HPI: Courtney [name] is a 77 y.o. female seen 06/10/22 as below who is here to assess GERD + dysphagia and screen for CRCA  She is on prednisone due to URI sxs     Assessment & Plan:        Encounter Diagnoses  Name Primary?   Gastroesophageal reflux disease, unspecified whether esophagitis present Yes   Esophageal dysphagia     Hiatal hernia     Family history of colon cancer     Prolapsed internal hemorrhoids, grade 2      She is having flare of GERD, recurrent dysphagia and has a hiatal hernia.  Evaluate with EGD and likely treat with esophageal dilation.  Further plans pending that.  She wants to know if she can use twice daily pantoprazole at times which she has been doing occasionally and that is fine for now.   Screening colonoscopy will be scheduled for the same day of her EGD.   The risks and benefits as well as alternatives of endoscopic procedure(s) have been reviewed. All questions answered. The patient agrees to proceed.   Right posterior hemorrhoid banded today reassess at colonoscopy.     CC: [name], [name], MD     Subjective:    Chief Complaint: Reflux, hemorrhoids   HPI 77 year old white woman with a history of GERD as well as prolapsed internal hemorrhoids status post multiple banding procedures 20 19-20 20.  Also has a family history of colon cancer.  She has been having some GERD flareups with increasing heartburn issues.  She had been on omeprazole was having more problems with "burping up acid intermittently" and this was occurring about an hour after meals.  Things are better after changing to Protonix 40 mg daily.  However she still has symptoms she is having some intermittent dysphagia and has a history of esophageal stricture dilation in 2014.   Sometimes she coughs with the first couple of bites of food.  She had a chest x-ray recently and is asking questions about her hiatal hernia.  We reviewed the pathophysiology of hiatal hernias. 01/26/2022 visit with Dr. [name], PCP is reviewed.  She had an asthma exacerbation.     EGD with savary dilation to 17 mm 05/19/2012 Delfin Edis, MD also with fundic gland polyps and 3 cm hiatal hernia     Family history of colon cancer in her mother and sister with last colonoscopy December 2018.  Hemorrhoids and diverticulosis.  She does not have a history of polyps.  She is interested in pursuing another screening colonoscopy.   She has had a good response to banding of grade 3 internal hemorrhoids in 2019 and 2020 but is starting to have some symptoms again with difficulty cleansing.  She does not have any need to manually reduce but she has noticed some swelling there.  She is interested in reassessment and possible repeat banding.  No bleeding Past Medical History:  Diagnosis Date   Asthma    Breast cancer ([place]) 11/2010   post double mastectomy; Dr Humphrey Rolls   Diverticulosis    Family history of breast cancer    GERD (gastroesophageal reflux disease)    S/P dilation X 1   Hemorrhoids    Hypertension    Night sweats  OSA (obstructive sleep apnea) 08/03/2017   Very mild with AHI 7.2/hr.     Prolapsed internal hemorrhoids, grade 3 06/27/2017   Grade 2 on anoscopy exam but grade 3 by history and seen a colonoscopy as well All 3 columns banded 06/27/2017  09/21/2017 banded RP and RA     Reactive airway disease     Past Surgical History:  Procedure Laterality Date   ABDOMINAL HYSTERECTOMY     bilateral breast reconstruction  Jan 2013   BREAST SURGERY  01/13/11   bilateral total mastectomy; Dr Dalbert Batman   COLONOSCOPY  2014   Dr Olevia Perches; hyperplastic polyp   ESOPHAGEAL DILATION  2014   HEMORRHOID BANDING  2019   February and May   NASAL SEPTUM SURGERY     RECONSTRUCTION / CORRECTION OF NIPPLE /  Tresa Moore  June 2013   THYROGLOSSAL DUCT CYST      Prior to Admission medications   Medication Sig Start Date End Date Taking? Authorizing Provider  albuterol (VENTOLIN HFA) 108 (90 Base) MCG/ACT inhaler Inhale 2 puffs into the lungs every 6 (six) hours as needed for wheezing or shortness of breath. 01/26/22  Yes [name], [name], MD  amLODipine (NORVASC) 5 MG tablet TAKE 1 TABLET(5 MG) BY MOUTH DAILY 11/17/21  Yes [name], [name], MD  aspirin 81 MG tablet Take 81 mg by mouth daily.   Yes [provider]  budesonide-formoterol (SYMBICORT) 160-4.5 MCG/ACT inhaler INHALE 1 TO 2 PUFFS EVERY 12 HOURS AS NEEDED. GARGLE AND SPIT AFTER USE 07/21/22  Yes [name], [name], MD  calcium citrate-vitamin D (CITRACAL+D) 315-200 MG-UNIT per tablet Take 1 tablet by mouth daily.   Yes [provider]  Carboxymethylcellulose Sod PF (RETAINE CMC) 0.5 % SOLN Apply to eye.   Yes [provider]  Cholecalciferol (VITAMIN D3) 1000 UNITS CAPS Take by mouth daily.   Yes [provider]  co-enzyme Q-10 30 MG capsule Take 50 mg by mouth daily.   Yes [provider]  FINACEA 15 % cream Apply 1 application topically 2 (two) times daily. For rosacea 07/13/16  Yes [provider]  hydrochlorothiazide (MICROZIDE) 12.5 MG capsule TAKE 1 CAPSULE(12.5 MG) BY MOUTH DAILY 11/17/21  Yes [name], [name], MD  HYDROcodone bit-homatropine (HYCODAN) 5-1.5 MG/5ML syrup Take 5 mLs by mouth 2 (two) times daily as needed for up to 10 days for cough. 07/21/22 07/31/22 Yes [name], [name], MD  losartan (COZAAR) 100 MG tablet TAKE 1 TABLET(100 MG) BY MOUTH DAILY 11/17/21  Yes [name], [name], MD  Misc Natural Products (TART CHERRY ADVANCED PO) Take 2,400 mg by mouth daily.   Yes [provider]  montelukast (SINGULAIR) 10 MG tablet TAKE 1 TABLET(10 MG) BY MOUTH AT BEDTIME 04/02/22  Yes [name], [name], MD  Multiple Vitamins-Minerals (MULTIVITAMIN WITH MINERALS) tablet Take 1 tablet by mouth daily.    Yes [provider]  OMEGA 3 1000 MG CAPS Take 1,000 mg by mouth daily.   Yes [provider]  pantoprazole (PROTONIX) 40 MG tablet Take 1 tablet (40 mg total) by mouth daily. 01/26/22  Yes [name], [name], MD  predniSONE (DELTASONE) 20 MG tablet Take 2 tablets (40 mg total) by mouth daily with breakfast for 5 days. 07/21/22 07/26/22 Yes [name], [name], MD  psyllium (METAMUCIL) 58.6 % powder Take 1 packet by mouth daily.   Yes [provider]  rosuvastatin (CRESTOR) 5 MG tablet TAKE 1 TABLET(5 MG) BY MOUTH DAILY 03/08/22  Yes [name], [name],  MD    Current Outpatient Medications  Medication Sig Dispense Refill   albuterol (VENTOLIN HFA) 108 (90 Base) MCG/ACT inhaler Inhale 2 puffs into the lungs every 6 (six) hours as needed for wheezing or shortness of breath. 8 g 3   amLODipine (NORVASC) 5 MG tablet TAKE 1 TABLET(5 MG) BY MOUTH DAILY 90 tablet 2   aspirin 81 MG tablet Take 81 mg by mouth daily.     budesonide-formoterol (SYMBICORT) 160-4.5 MCG/ACT inhaler INHALE 1 TO 2 PUFFS EVERY 12 HOURS AS NEEDED. GARGLE AND SPIT AFTER USE 10.2 g 11   calcium citrate-vitamin D (CITRACAL+D) 315-200 MG-UNIT per tablet Take 1 tablet by mouth daily.     Carboxymethylcellulose Sod PF (RETAINE CMC) 0.5 % SOLN Apply to eye.     Cholecalciferol (VITAMIN D3) 1000 UNITS CAPS Take by mouth daily.     co-enzyme Q-10 30 MG capsule Take 50 mg by mouth daily.     FINACEA 15 % cream Apply 1 application topically 2 (two) times daily. For rosacea  0   hydrochlorothiazide (MICROZIDE) 12.5 MG capsule TAKE 1 CAPSULE(12.5 MG) BY MOUTH DAILY 90 capsule 2   HYDROcodone bit-homatropine (HYCODAN) 5-1.5 MG/5ML syrup Take 5 mLs by mouth 2 (two) times daily as needed for up to 10 days for cough. 80 mL 0   losartan (COZAAR) 100 MG tablet TAKE 1 TABLET(100 MG) BY MOUTH DAILY 90 tablet 2   Misc Natural Products (TART CHERRY ADVANCED PO) Take 2,400 mg by mouth daily.     montelukast (SINGULAIR) 10 MG tablet TAKE  1 TABLET(10 MG) BY MOUTH AT BEDTIME 90 tablet 3   Multiple Vitamins-Minerals (MULTIVITAMIN WITH MINERALS) tablet Take 1 tablet by mouth daily.     OMEGA 3 1000 MG CAPS Take 1,000 mg by mouth daily.     pantoprazole (PROTONIX) 40 MG tablet Take 1 tablet (40 mg total) by mouth daily. 90 tablet 2   predniSONE (DELTASONE) 20 MG tablet Take 2 tablets (40 mg total) by mouth daily with breakfast for 5 days. 10 tablet 0   psyllium (METAMUCIL) 58.6 % powder Take 1 packet by mouth daily.     rosuvastatin (CRESTOR) 5 MG tablet TAKE 1 TABLET(5 MG) BY MOUTH DAILY 90 tablet 1   No current facility-administered medications for this visit.    Allergies as of 07/23/2022   (No Known Allergies)    Family History  Problem Relation Age of Onset   Breast cancer Mother 67   Colon cancer Mother 69   Cervical cancer Mother 50       s/p hysterectomy   Heart attack Father 14   Diabetes Father        probable pre DM   Skin cancer Father        maybe melanoma   Cancer Sister 41       appendiceal   Heart attack Maternal Grandfather 36   Heart attack Paternal Uncle        X52 ; >50y   Heart attack Maternal Uncle        d. 9   Parkinson's disease Maternal Grandmother        d. 72   Heart attack Paternal Grandfather    Breast cancer Other        unspecified age; maternal great aunt (MGM's sister)   Breast cancer Cousin        maternal 1st cousin, once-removed; unspecified age   Heart attack Paternal Uncle        >50y  Stroke Neg Hx     Social History   Socioeconomic History   Marital status: Married    Spouse name: Not on file   Number of children: Not on file   Years of education: Not on file   Highest education level: Bachelor's degree (e.g., BA, AB, BS)  Occupational History   Not on file  Tobacco Use   Smoking status: Former    Years: 5    Types: Cigarettes    Quit date: 05/03/1965    Years since quitting: 57.2   Smokeless tobacco: Never   Tobacco comments:    smoked 1962-1967, less  than 1 ppd  Vaping Use   Vaping Use: Never used  Substance and Sexual Activity   Alcohol use: Yes    Alcohol/week: 14.0 standard drinks of alcohol    Types: 14 Glasses of wine per week    Comment: 2-3 /day   Drug use: No   Sexual activity: Yes  Other Topics Concern   Not on file  Social History Narrative   Married   2-3 glasses wine/day   remote small vol smmoker   Social Determinants of Health   Financial Resource Strain: Low Risk  (07/06/2021)   Overall Financial Resource Strain (CARDIA)    Difficulty of Paying Living Expenses: Not hard at all  Food Insecurity: No Food Insecurity (07/06/2021)   Hunger Vital Sign    Worried About Running Out of Food in the Last Year: Never true    Ran Out of Food in the Last Year: Never true  Transportation Needs: No Transportation Needs (07/06/2021)   PRAPARE - Hydrologist (Medical): No    Lack of Transportation (Non-Medical): No  Physical Activity: Sufficiently Active (07/06/2021)   Exercise Vital Sign    Days of Exercise per Week: 6 days    Minutes of Exercise per Session: 60 min  Stress: No Stress Concern Present (07/06/2021)   [place]    Feeling of Stress : Not at all  Social Connections: [place] (07/06/2021)   Social Connection and Isolation Panel [NHANES]    Frequency of Communication with Friends and Family: More than three times a week    Frequency of Social Gatherings with Friends and Family: More than three times a week    Attends Religious Services: More than 4 times per year    Active Member of [employer] or Organizations: Yes    Attends Music therapist: More than 4 times per year    Marital Status: Married  Human resources officer Violence: Not At Risk (07/06/2021)   Humiliation, Afraid, Rape, and Kick questionnaire    Fear of Current or Ex-Partner: No    Emotionally Abused: No    Physically Abused: No    Sexually Abused:  No    Review of Systems:  All other review of systems negative except as mentioned in the HPI.  Physical Exam: Vital signs BP (!) 124/57   Pulse 65   Temp 97.8 F (36.6 C)   Ht 4\' 8"  (1.422 m)   Wt 115 lb (52.2 kg)   SpO2 98%   BMI 25.78 kg/m   General:   Alert,  Well-developed, well-nourished, pleasant and cooperative in NAD Lungs:  Clear throughout to auscultation.   Heart:  Regular rate and rhythm; no murmurs, clicks, rubs,  or gallops. Abdomen:  Soft, nontender and nondistended. Normal bowel sounds.   Neuro/Psych:  Alert and cooperative. Normal mood  and affect. A and O x 3   @Lacinda Curvin  Simonne Maffucci, MD, [facility] [phone] (pager) 07/23/2022 10:58 AM@

## 2022-07-23 NOTE — Op Note (Signed)
[place] [facility] Patient Name: Courtney [name] Procedure Date: 07/23/2022 11:07 AM MRN: [id number] Endoscopist: Gatha Mayer , MD, [id number] Age: 77 Referring MD:  Date of Birth: 1945/11/11 Gender: Female Account #: [id number] Procedure:                Colonoscopy Indications:              Screening patient at increased risk: Family history                            of colorectal cancer in multiple 1st-degree                            relatives Medicines:                Monitored Anesthesia Care Procedure:                Pre-Anesthesia Assessment:                           - Prior to the procedure, a History and Physical                            was performed, and patient medications and                            allergies were reviewed. The patient's tolerance of                            previous anesthesia was also reviewed. The risks                            and benefits of the procedure and the sedation                            options and risks were discussed with the patient.                            All questions were answered, and informed consent                            was obtained. Prior Anticoagulants: The patient has                            taken no anticoagulant or antiplatelet agents. ASA                            Grade Assessment: II - A patient with mild systemic                            disease. After reviewing the risks and benefits,                            the patient was deemed in satisfactory condition to  undergo the procedure.                           After obtaining informed consent, the colonoscope                            was passed under direct vision. Throughout the                            procedure, the patient's blood pressure, pulse, and                            oxygen saturations were monitored continuously. The                            Olympus [id number]) Colonoscope was                             introduced through the anus and advanced to the the                            cecum, identified by appendiceal orifice and                            ileocecal valve. The colonoscopy was performed                            without difficulty. The patient tolerated the                            procedure. The quality of the bowel preparation was                            good. The ileocecal valve, appendiceal orifice, and                            rectum were photographed. Scope In: 11:19:38 AM Scope Out: 11:35:17 AM Scope Withdrawal Time: 0 hours 9 minutes 56 seconds  Total Procedure Duration: 0 hours 15 minutes 39 seconds  Findings:                 Hemorrhoids were found on perianal exam.                           Multiple diverticula were found in the entire colon.                           External and internal hemorrhoids were found. The                            hemorrhoids were Grade II (internal hemorrhoids                            that prolapse but reduce spontaneously).  The exam was otherwise without abnormality on                            direct and retroflexion views. Complications:            She regurgiaiate fluid oralluy and nasally + some                            rhinorrhea - suctioned wll but cannot exclude some                            component aspiration O2 sats were fine - she was                            coughin also - not pre-procedure but during                            procedures - she has URI Estimated Blood Loss:     Estimated blood loss: none. Impression:               - Hemorrhoids found on perianal exam.                           - Severe diverticulosis in the entire examined                            colon.                           - External and internal hemorrhoids.                           - The examination was otherwise normal on direct                            and retroflexion  views.                           - No specimens collected. Recommendation:           - Patient has a contact number available for                            emergencies. The signs and symptoms of potential                            delayed complications were discussed with the                            patient. Return to normal activities tomorrow.                            Written discharge instructions were provided to the                            patient.                           -  Clear liquids x 1 hour then soft foods rest of                            day. Start prior diet tomorrow.                           - Continue present medications.                           - No repeat colonoscopy due to current age (7                            years or older) and the absence of colonic polyps.                           - generic Augmentin 875 mg bid x 7 days given                            possible aspiration                           return to clinic for repeat hemorrhoid banding if                            desired Gatha Mayer, MD 07/23/2022 11:56:18 AM This report has been signed electronically.

## 2022-07-23 NOTE — Patient Instructions (Addendum)
I dilated the esophagus to help swallowing. You have a hiatal hernia as we knew and I slso some some innocent-looking stomach polyps. I took biopsies to check but am not concerned about them being a problem.  The colonoscopy showed diverticulosis but no polyps - no more routine colonoscopy.  You had some regurgitation and a lot of nasal discharge and coughing. I am not sure but some of it may have gone into lungs so as a precaution I am prescribing an antibiotic.  You also have some thrush on tongue so prescribing Nystatin.  Hemorrhoids are still an issue - reasonable to band again - let me know.  I appreciate the opportunity to care for you. Gatha Mayer, MD, [facility]  Handout provided on hiatal hernia, post dilation diet, diverticulosis, hemorrhoids and hemorrhoid banding.  Continue present medications.  Await pathology results.  Also had some tongue thrush- on prednisone for a "cold" had rhinorrhea and some regurgitation immediately after procedure (and during colonoscopy) nystatin prescription.   YOU HAD AN ENDOSCOPIC PROCEDURE TODAY AT [facility]:   Refer to the procedure report that was given to you for any specific questions about what was found during the examination.  If the procedure report does not answer your questions, please call your gastroenterologist to clarify.  If you requested that your care partner not be given the details of your procedure findings, then the procedure report has been included in a sealed envelope for you to review at your convenience later.  YOU SHOULD EXPECT: Some feelings of bloating in the abdomen. Passage of more gas than usual.  Walking can help get rid of the air that was put into your GI tract during the procedure and reduce the bloating. If you had a lower endoscopy (such as a colonoscopy or flexible sigmoidoscopy) you may notice spotting of blood in your stool or on the toilet paper. If you underwent a bowel prep for your procedure,  you may not have a normal bowel movement for a few days.  Please Note:  You might notice some irritation and congestion in your nose or some drainage.  This is from the oxygen used during your procedure.  There is no need for concern and it should clear up in a day or so.  SYMPTOMS TO REPORT IMMEDIATELY:  Following lower endoscopy (colonoscopy or flexible sigmoidoscopy):  Excessive amounts of blood in the stool  Significant tenderness or worsening of abdominal pains  Swelling of the abdomen that is new, acute  Fever of 100F or higher  Following upper endoscopy (EGD)  Vomiting of blood or coffee ground material  New chest pain or pain under the shoulder blades  Painful or persistently difficult swallowing  New shortness of breath  Fever of 100F or higher  Black, tarry-looking stools  For urgent or emergent issues, a gastroenterologist can be reached at any hour by calling [phone]. Do not use MyChart messaging for urgent concerns.    DIET:  Post dilation diet: Clear liquids for 1 hour (until 1245). Then a Soft diet (see handout) for the rest of today. Start prior diet tomorrow. Drink plenty of fluids but you should avoid alcoholic beverages for 24 hours.  ACTIVITY:  You should plan to take it easy for the rest of today and you should NOT DRIVE or use heavy machinery until tomorrow (because of the sedation medicines used during the test).    FOLLOW UP: Our staff will call the number listed on your records the  next business day following your procedure.  We will call around 7:15- 8:00 am to check on you and address any questions or concerns that you may have regarding the information given to you following your procedure. If we do not reach you, we will leave a message.     If any biopsies were taken you will be contacted by phone or by letter within the next 1-3 weeks.  Please call us at [phone] if you have not heard about the biopsies in 3 weeks.     SIGNATURES/CONFIDENTIALITY: You and/or your care partner have signed paperwork which will be entered into your electronic medical record.  These signatures attest to the fact that that the information above on your After Visit Summary has been reviewed and is understood.  Full responsibility of the confidentiality of this discharge information lies with you and/or your care-partner.

## 2022-07-23 NOTE — Progress Notes (Signed)
When we started to colon, pt started a barky cough.  Small amount of fluid in mouth and nose.  BS had wheezing.  O2 already at max.  Robinul given.

## 2022-07-26 ENCOUNTER — Telehealth: Payer: Self-pay

## 2022-07-26 NOTE — Telephone Encounter (Signed)
  Follow up Call-     07/23/2022   10:46 AM  Call back number  Post procedure Call Back phone  # [phone]-  Permission to leave phone message Yes     Patient questions:  Do you have a fever, pain , or abdominal swelling? No. Pain Score  0 *  Have you tolerated food without any problems? Yes.    Have you been able to return to your normal activities? Yes.    Do you have any questions about your discharge instructions: Diet   No. Medications  No. Follow up visit  No.  Do you have questions or concerns about your Care? Yes  Patient states "I am still coughing, sometimes I am coughing up clear to white mucus".  Denies any SOB, wheezing, chest pain.  States she has started the ABX which was prescribed to her after procedure.  She also states that she "coughed before procedure, has a chronic cough, and does not think this is any worse".  Patient advised if coughing worsens, she experiences any SOB, wheezing, chest pain, worsening coughing, bloody sputum to report to ED or PCP and she verbalized understanding.  Actions: * If pain score is 4 or above: Physician/ provider Notified : Silvano Rusk, MD.

## 2022-07-27 NOTE — Telephone Encounter (Signed)
Note reviewed.  No changes otherwise.

## 2022-07-29 ENCOUNTER — Encounter: Payer: Self-pay | Admitting: Internal Medicine

## 2022-08-04 ENCOUNTER — Other Ambulatory Visit: Payer: Self-pay | Admitting: Family Medicine

## 2022-08-04 DIAGNOSIS — I10 Essential (primary) hypertension: Secondary | ICD-10-CM

## 2022-08-13 ENCOUNTER — Telehealth: Payer: Self-pay | Admitting: Family Medicine

## 2022-08-13 DIAGNOSIS — J4520 Mild intermittent asthma, uncomplicated: Secondary | ICD-10-CM

## 2022-08-13 MED ORDER — ALBUTEROL SULFATE HFA 108 (90 BASE) MCG/ACT IN AERS: 2.0000 | INHALATION_SPRAY | Freq: Four times a day (QID) | RESPIRATORY_TRACT | 3 refills | Status: AC | PRN

## 2022-08-13 NOTE — Telephone Encounter (Signed)
Rx sent 

## 2022-08-13 NOTE — Telephone Encounter (Signed)
Prescription Request  08/13/2022  LOV: 01/26/2022  What is the name of the medication or equipment? albuterol (VENTOLIN HFA) 108 (90 Base) MCG/ACT inhaler   Have you contacted your pharmacy to request a refill? No   Which pharmacy would you like this sent to?     [facility] - [place], [place] - [address] [place] [address] Phone: [phone] Fax: [phone]    Patient notified that their request is being sent to the clinical staff for review and that they should receive a response within 2 business days.   Please advise at Mobile [phone] (mobile)

## 2022-08-26 ENCOUNTER — Other Ambulatory Visit: Payer: Self-pay | Admitting: Family Medicine

## 2022-08-26 DIAGNOSIS — I10 Essential (primary) hypertension: Secondary | ICD-10-CM

## 2022-09-06 ENCOUNTER — Other Ambulatory Visit: Payer: Self-pay | Admitting: Family Medicine

## 2022-09-06 DIAGNOSIS — I10 Essential (primary) hypertension: Secondary | ICD-10-CM

## 2022-09-13 DIAGNOSIS — L821 Other seborrheic keratosis: Secondary | ICD-10-CM | POA: Diagnosis not present

## 2022-09-13 DIAGNOSIS — L910 Hypertrophic scar: Secondary | ICD-10-CM | POA: Diagnosis not present

## 2022-09-13 DIAGNOSIS — Z85828 Personal history of other malignant neoplasm of skin: Secondary | ICD-10-CM | POA: Diagnosis not present

## 2022-09-13 DIAGNOSIS — L218 Other seborrheic dermatitis: Secondary | ICD-10-CM | POA: Diagnosis not present

## 2022-09-13 DIAGNOSIS — L570 Actinic keratosis: Secondary | ICD-10-CM | POA: Diagnosis not present

## 2022-09-20 DIAGNOSIS — H11421 Conjunctival edema, right eye: Secondary | ICD-10-CM | POA: Diagnosis not present

## 2022-10-04 ENCOUNTER — Other Ambulatory Visit: Payer: Self-pay | Admitting: Family Medicine

## 2022-10-04 DIAGNOSIS — K219 Gastro-esophageal reflux disease without esophagitis: Secondary | ICD-10-CM

## 2022-10-25 ENCOUNTER — Other Ambulatory Visit: Payer: Self-pay | Admitting: Family Medicine

## 2022-10-25 DIAGNOSIS — I10 Essential (primary) hypertension: Secondary | ICD-10-CM

## 2022-10-28 ENCOUNTER — Other Ambulatory Visit: Payer: Self-pay

## 2022-10-28 ENCOUNTER — Ambulatory Visit: Payer: Medicare PPO | Admitting: Family Medicine

## 2022-10-28 ENCOUNTER — Encounter: Payer: Self-pay | Admitting: Family Medicine

## 2022-10-28 VITALS — BP 110/70 | HR 63 | Ht <= 58 in | Wt 109.0 lb

## 2022-10-28 DIAGNOSIS — M7022 Olecranon bursitis, left elbow: Secondary | ICD-10-CM | POA: Diagnosis not present

## 2022-10-28 NOTE — Patient Instructions (Addendum)
Thank you for coming in today.   I think you have olecranon bursitis.   Please use Voltaren gel (Generic Diclofenac Gel) up to 4x daily for pain as needed.  This is available over-the-counter as both the name brand Voltaren gel and the generic diclofenac gel.   Use compression and protect the elbow from getting bumped.   I recommend you obtained a compression sleeve to help with your joint problems. There are many options on the market however I recommend obtaining a full elbow Body Helix compression sleeve.  You can find information (including how to appropriate measure yourself for sizing) can be found at www.Body [employer].ch.  Many of these products are health savings account (HSA) eligible.   You can use the compression sleeve at any time throughout the day but is most important to use while being active as well as for 2 hours post-activity.   It is appropriate to ice following activity with the compression sleeve in place.

## 2022-10-28 NOTE — Progress Notes (Signed)
   I, Stevenson Clinch, CMA acting as a scribe for Courtney Graham, MD.  Courtney [name] is a 77 y.o. female who presents to [facility] today for L elbow pain. Pt was previously seen by Dr. Katrinka Blazing in 2022-23 for R shoulder, L hip, and R knee pain.  Today, pt c/o L elbow pain x 1 month. Sx started after hitting the elbow on the back of a wooden chair. Swelling present at time of injury, has improved significantly. Denies sharp shooting pain into the lower arm.   Radiates: no Paresthesia: sometimes when sleeping Grip strength: no Aggravates: putting pressure on the area Treatments tried: Voltaren with some relief.  Pertinent review of systems: No fevers or chills  Relevant historical information: Hypertension   Exam:  BP 110/70   Pulse 63   Ht 4\' 8"  (1.422 m)   Wt 109 lb (49.4 kg)   SpO2 98%   BMI 24.44 kg/m  General: Well Developed, well nourished, and in no acute distress.   MSK: Left elbow with mild erythema at the skin overlying the olecranon.  Minimally swollen.  Nontender normal elbow motion and strength.    Assessment and Plan: 77 y.o. female with resolving left elbow olecranon bursitis.  There is not enough swelling today to consider injecting.  Her symptoms do seem to be improving.  Plan for compression of Voltaren gel.  If not improving or worsening injection may be warranted.   PDMP not reviewed this encounter. No orders of the defined types were placed in this encounter.  No orders of the defined types were placed in this encounter.    Discussed warning signs or symptoms. Please see discharge instructions. Patient expresses understanding.   The above documentation has been reviewed and is accurate and complete Courtney [name], M.D.

## 2022-12-01 NOTE — Progress Notes (Unsigned)
[facility] [address] [place] [address] Phone: [phone] Subjective:   INadine Counts, am serving as a scribe for Dr. Antoine Primas.  I'm seeing this patient by the request  of:  [name], [name], MD  CC: Left elbow pain follow-up, right knee pain, left ankle  QIO:[id number]  Courtney [name] is a 77 y.o. female coming in with complaint of L elbow pain. Seen in Jan 2023 for shoulder pain. Has seen Dr. Denyse Amass. Patient states check out elbow and R knee. Hammer toes are acting up.      Past Medical History:  Diagnosis Date   Asthma    Breast cancer (HCC) 11/2010   post double mastectomy; Dr Welton Flakes   Diverticulosis    Family history of breast cancer    Fundic gland polyps of stomach, benign 07/2022   GERD (gastroesophageal reflux disease)    S/P dilation X 1   Hemorrhoids    Hypertension    Night sweats    OSA (obstructive sleep apnea) 08/03/2017   Very mild with AHI 7.2/hr.     Prolapsed internal hemorrhoids, grade 3 06/27/2017   Grade 2 on anoscopy exam but grade 3 by history and seen a colonoscopy as well All 3 columns banded 06/27/2017  09/21/2017 banded RP and RA     Reactive airway disease    Past Surgical History:  Procedure Laterality Date   ABDOMINAL HYSTERECTOMY     bilateral breast reconstruction  Jan 2013   BREAST SURGERY  01/13/11   bilateral total mastectomy; Dr Derrell Lolling   COLONOSCOPY  2014   Dr Juanda Chance; hyperplastic polyp   ESOPHAGEAL DILATION  2014   HEMORRHOID BANDING  2019   February and May   NASAL SEPTUM SURGERY     RECONSTRUCTION / CORRECTION OF NIPPLE / Rafael Bihari  June 2013   THYROGLOSSAL DUCT CYST     Social History   Socioeconomic History   Marital status: Married    Spouse name: Not on file   Number of children: Not on file   Years of education: Not on file   Highest education level: Bachelor's degree (e.g., BA, AB, BS)  Occupational History   Not on file  Tobacco Use   Smoking status: Former    Current  packs/day: 0.00    Types: Cigarettes    Start date: 05/03/1960    Quit date: 05/03/1965    Years since quitting: 57.6   Smokeless tobacco: Never   Tobacco comments:    smoked 1962-1967, less than 1 ppd  Vaping Use   Vaping status: Never Used  Substance and Sexual Activity   Alcohol use: Yes    Alcohol/week: 14.0 standard drinks of alcohol    Types: 14 Glasses of wine per week    Comment: 2-3 /day   Drug use: No   Sexual activity: Yes  Other Topics Concern   Not on file  Social History Narrative   Married   2-3 glasses wine/day   remote small vol smmoker   Social Determinants of Health   Financial Resource Strain: Low Risk  (07/06/2021)   Overall Financial Resource Strain (CARDIA)    Difficulty of Paying Living Expenses: Not hard at all  Food Insecurity: No Food Insecurity (07/06/2021)   Hunger Vital Sign    Worried About Running Out of Food in the Last Year: Never true    Ran Out of Food in the Last Year: Never true  Transportation Needs: No Transportation Needs (07/06/2021)  PRAPARE - Administrator, Civil Service (Medical): No    Lack of Transportation (Non-Medical): No  Physical Activity: Sufficiently Active (07/06/2021)   Exercise Vital Sign    Days of Exercise per Week: 6 days    Minutes of Exercise per Session: 60 min  Stress: No Stress Concern Present (07/06/2021)   [employer] of Occupational Health - Occupational Stress Questionnaire    Feeling of Stress : Not at all  Social Connections: Socially Integrated (07/06/2021)   Social Connection and Isolation Panel [NHANES]    Frequency of Communication with Friends and Family: More than three times a week    Frequency of Social Gatherings with Friends and Family: More than three times a week    Attends Religious Services: More than 4 times per year    Active Member of [employer] or Organizations: Yes    Attends Engineer, structural: More than 4 times per year    Marital Status: Married   No Known  Allergies Family History  Problem Relation Age of Onset   Breast cancer Mother 9   Colon cancer Mother 62   Cervical cancer Mother 69       s/p hysterectomy   Heart attack Father 29   Diabetes Father        probable pre DM   Skin cancer Father        maybe melanoma   Cancer Sister 44       appendiceal   Heart attack Maternal Grandfather 57   Heart attack Paternal Uncle        X2 ; >50y   Heart attack Maternal Uncle        d. 79   Parkinson's disease Maternal Grandmother        d. 65   Heart attack Paternal Grandfather    Breast cancer Other        unspecified age; maternal great aunt (MGM's sister)   Breast cancer Cousin        maternal 1st cousin, once-removed; unspecified age   Heart attack Paternal Uncle        >50y   Stroke Neg Hx      Current Outpatient Medications (Cardiovascular):    amLODipine (NORVASC) 5 MG tablet, TAKE ONE TABLET DAILY   hydrochlorothiazide (MICROZIDE) 12.5 MG capsule, TAKE ONE CAPSULE DAILY   losartan (COZAAR) 100 MG tablet, TAKE ONE TABLET DAILY   rosuvastatin (CRESTOR) 5 MG tablet, TAKE ONE TABLET BY MOUTH EVERY DAY  Current Outpatient Medications (Respiratory):    albuterol (VENTOLIN HFA) 108 (90 Base) MCG/ACT inhaler, Inhale 2 puffs into the lungs every 6 (six) hours as needed for wheezing or shortness of breath.   budesonide-formoterol (SYMBICORT) 160-4.5 MCG/ACT inhaler, INHALE 1 TO 2 PUFFS EVERY 12 HOURS AS NEEDED. GARGLE AND SPIT AFTER USE   montelukast (SINGULAIR) 10 MG tablet, TAKE 1 TABLET(10 MG) BY MOUTH AT BEDTIME  Current Outpatient Medications (Analgesics):    aspirin 81 MG tablet, Take 81 mg by mouth daily.   Current Outpatient Medications (Other):    amoxicillin-clavulanate (AUGMENTIN) 875-125 MG tablet, Take 1 tablet by mouth 2 (two) times daily.   calcium citrate-vitamin D (CITRACAL+D) 315-200 MG-UNIT per tablet, Take 1 tablet by mouth daily.   Carboxymethylcellulose Sod PF (RETAINE CMC) 0.5 % SOLN, Apply to eye.    Cholecalciferol (VITAMIN D3) 1000 UNITS CAPS, Take by mouth daily.   co-enzyme Q-10 30 MG capsule, Take 50 mg by mouth daily.   FINACEA 15 % cream, Apply  1 application topically 2 (two) times daily. For rosacea   Misc Natural Products (TART CHERRY ADVANCED PO), Take 2,400 mg by mouth daily.   Multiple Vitamins-Minerals (MULTIVITAMIN WITH MINERALS) tablet, Take 1 tablet by mouth daily.   nystatin (MYCOSTATIN) 100000 UNIT/ML suspension, Take 5 mLs (500,000 Units total) by mouth 4 (four) times daily. Swish and swallow   OMEGA 3 1000 MG CAPS, Take 1,000 mg by mouth daily.   pantoprazole (PROTONIX) 40 MG tablet, TAKE ONE TABLET DAILY   psyllium (METAMUCIL) 58.6 % powder, Take 1 packet by mouth daily.   Reviewed prior external information including notes and imaging from  primary care provider As well as notes that were available from care everywhere and other healthcare systems.  Past medical history, social, surgical and family history all reviewed in electronic medical record.  No pertanent information unless stated regarding to the chief complaint.   Review of Systems:  No headache, visual changes, nausea, vomiting, diarrhea, constipation, dizziness, abdominal pain, skin rash, fevers, chills, night sweats, weight loss, swollen lymph nodes, body aches, joint swelling, chest pain, shortness of breath, mood changes. POSITIVE muscle aches  Objective  Blood pressure 110/68, pulse 73, height 4\' 8"  (1.422 m), weight 108 lb (49 kg), SpO2 94%.   General: No apparent distress alert and oriented x3 mood and affect normal, dressed appropriately.  HEENT: Pupils equal, extraocular movements intact  Respiratory: Patient's speak in full sentences and does not appear short of breath  Cardiovascular: No lower extremity edema, non tender, no erythema  Mild antalgic gait noted.  Right knee does have effusion noted.  Lacks last 10 degrees of flexion.  Patient send left elbow has good stability noted.  Full  range of motion.  No significant swelling of the olecranon bursa at this time.  Limited muscular skeletal ultrasound was performed and interpreted by Antoine Primas, M   Limited ultrasound of patient's elbow does show what appears to be a very small effusion noted.  After informed written and verbal consent, patient was seated on exam table. Right knee was prepped with alcohol swab and utilizing anterolateral approach, patient's right knee space was injected with 4:1  marcaine 0.5%: Kenalog 40mg /dL. Patient tolerated the procedure well without immediate complications.    Impression and Recommendations:     The above documentation has been reviewed and is accurate and complete Judi Saa, DO

## 2022-12-02 ENCOUNTER — Encounter: Payer: Self-pay | Admitting: Family Medicine

## 2022-12-02 ENCOUNTER — Other Ambulatory Visit: Payer: Self-pay

## 2022-12-02 ENCOUNTER — Ambulatory Visit: Payer: Medicare PPO | Admitting: Family Medicine

## 2022-12-02 VITALS — BP 110/68 | HR 73 | Ht <= 58 in | Wt 108.0 lb

## 2022-12-02 DIAGNOSIS — M1711 Unilateral primary osteoarthritis, right knee: Secondary | ICD-10-CM

## 2022-12-02 DIAGNOSIS — M7022 Olecranon bursitis, left elbow: Secondary | ICD-10-CM | POA: Diagnosis not present

## 2022-12-02 NOTE — Patient Instructions (Addendum)
Injection in knee today See you again in 3 months

## 2022-12-02 NOTE — Assessment & Plan Note (Signed)
Will plan on bursitis.  Discussed with patient about icing regimen and home exercises.  Discussed which activities to do and which ones to avoid.  I believe the patient is completely resolved and will be fine with conservative therapy.

## 2022-12-02 NOTE — Assessment & Plan Note (Signed)
Chronic problem with increasing swelling.  Given injection today and tolerated the procedure well.  Hopeful that this will make significant improvement.  Patient has not needed another injection previously.  We discussed icing regimen and home exercises.  Patient could be candidate for viscosupplementation.  Will see if patient would like to proceed with that.  Follow-up with me again otherwise in 6 to 8 weeks

## 2022-12-16 ENCOUNTER — Encounter (INDEPENDENT_AMBULATORY_CARE_PROVIDER_SITE_OTHER): Payer: Self-pay

## 2022-12-16 IMAGING — DX DG HIP (WITH OR WITHOUT PELVIS) 2-3V*L*
3 series · 3 of 3 positions shown · non-contrast
Comparison: None.

CLINICAL DATA: Atraumatic left hip pain times 2-3 months.

EXAM:
DG HIP (WITH OR WITHOUT PELVIS) 2-3V LEFT

[pelvis ap]
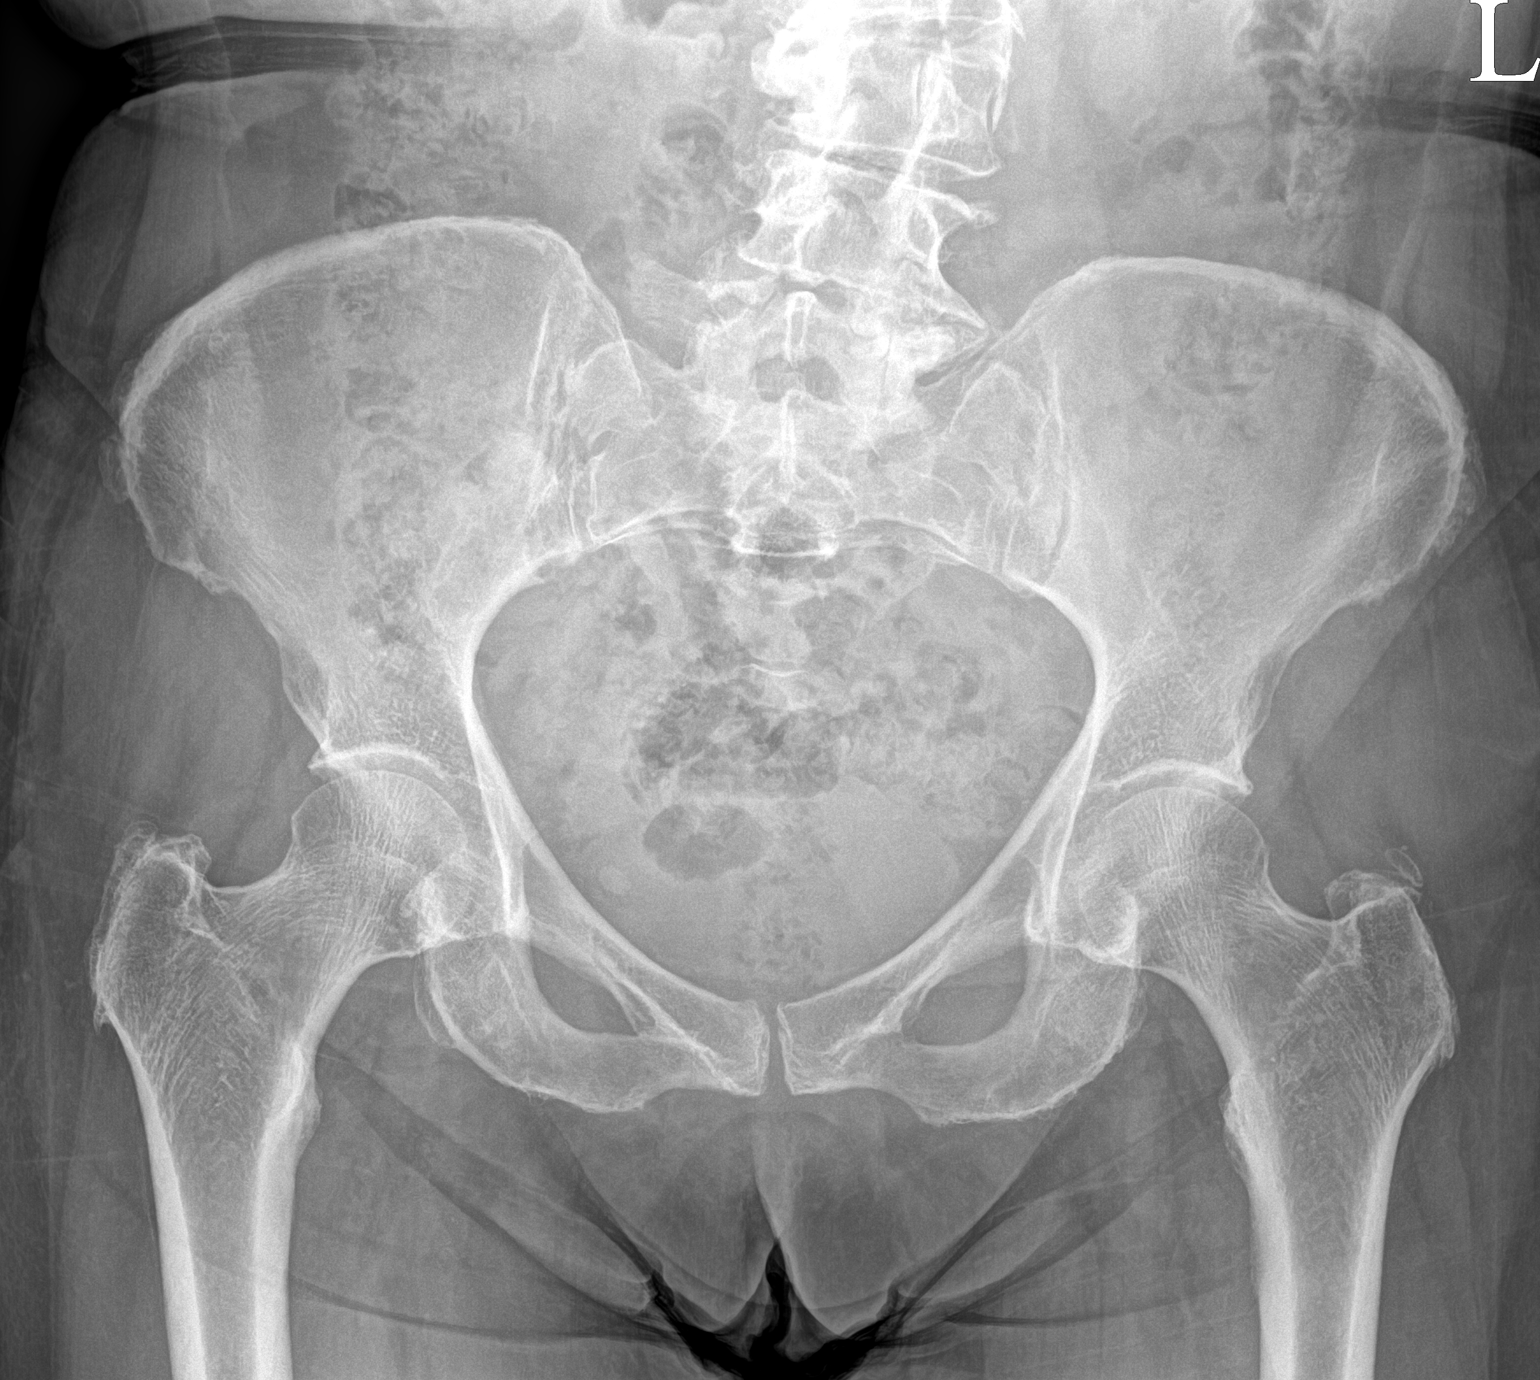

[hip ap]
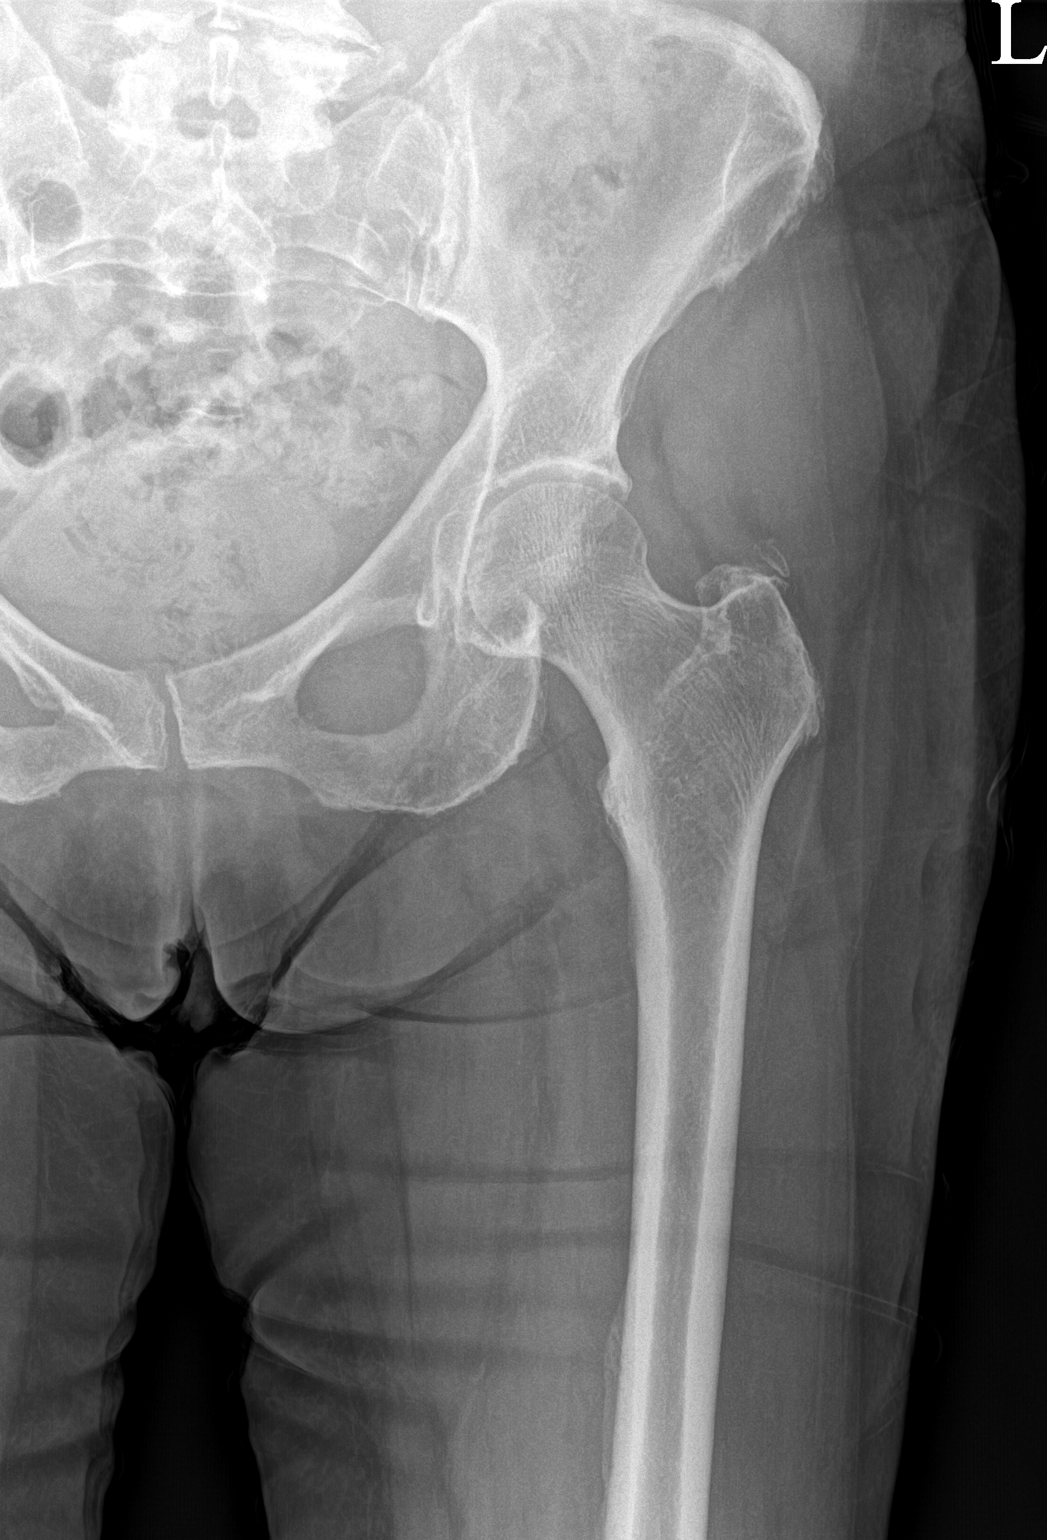

[hip frog leg]
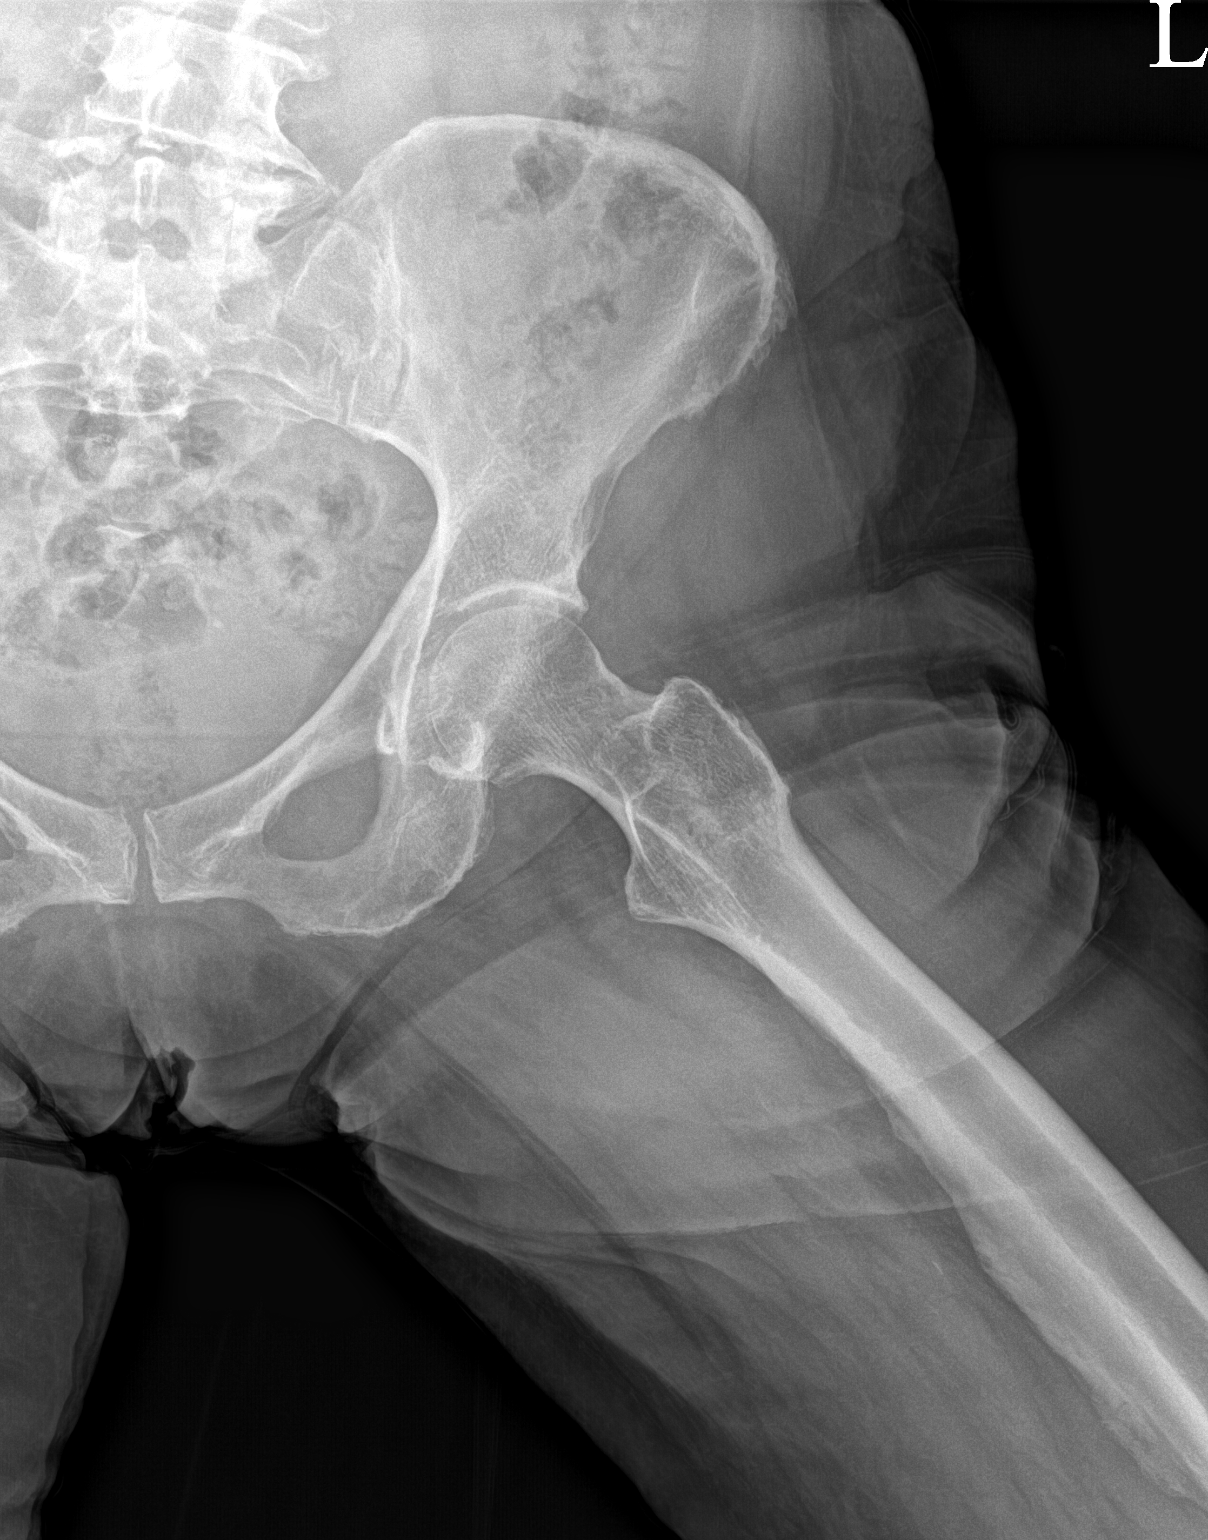

[3 of 3 positions shown; findings below may reference images not displayed]

FINDINGS: There is no evidence of hip fracture or dislocation. Mild
degenerative changes are seen involving both hips in the form of
joint space narrowing and acetabular sclerosis. Additional moderate
to marked severity degenerative changes are seen within the
visualized portion of the lower lumbar spine.
IMPRESSION: Degenerative changes, as described above, without an acute osseous
abnormality.

## 2022-12-16 IMAGING — DX DG SHOULDER 2+V*R*
3 series · 3 of 3 positions shown · non-contrast
Comparison: None.

CLINICAL DATA: Atraumatic right shoulder pain x1 month.

EXAM:
RIGHT SHOULDER - 2+ VIEW

[shoulder ap (1 of 2)]
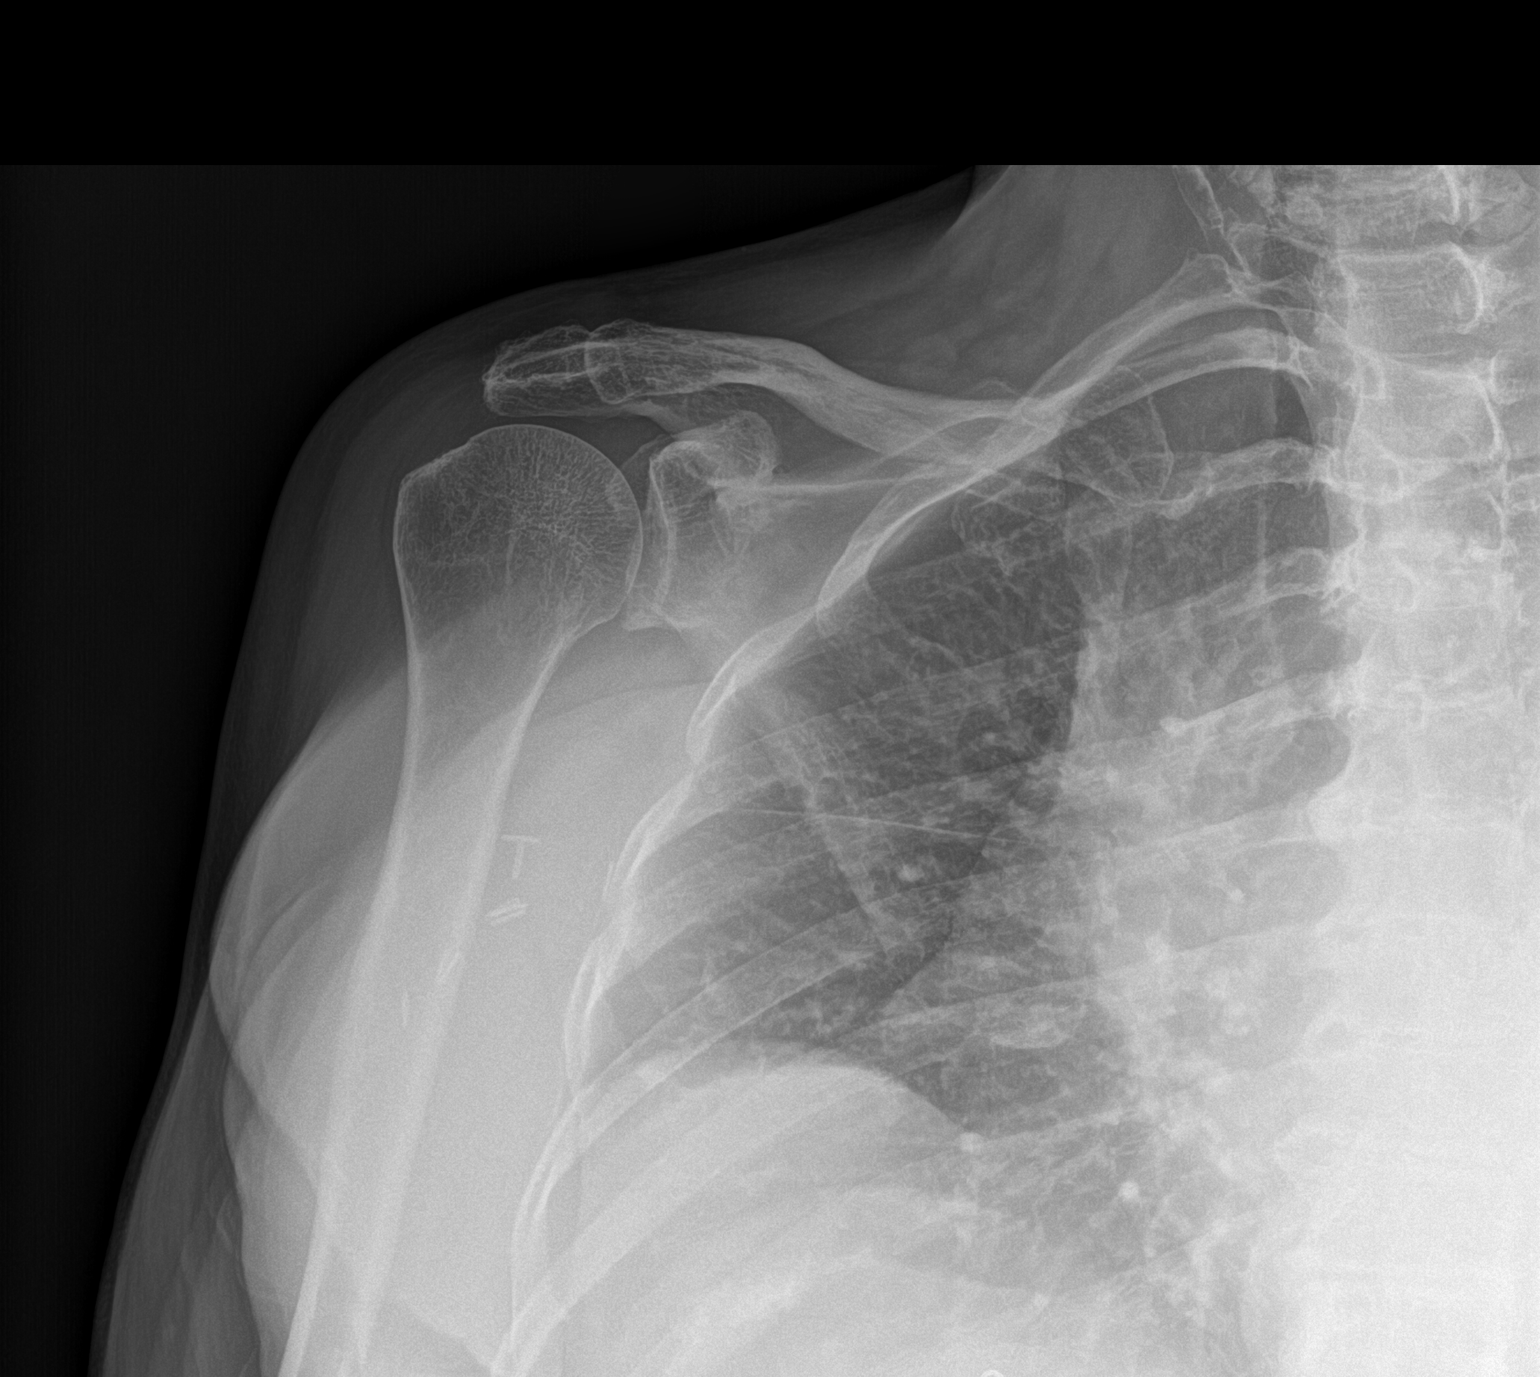

[shoulder ap (2 of 2)]
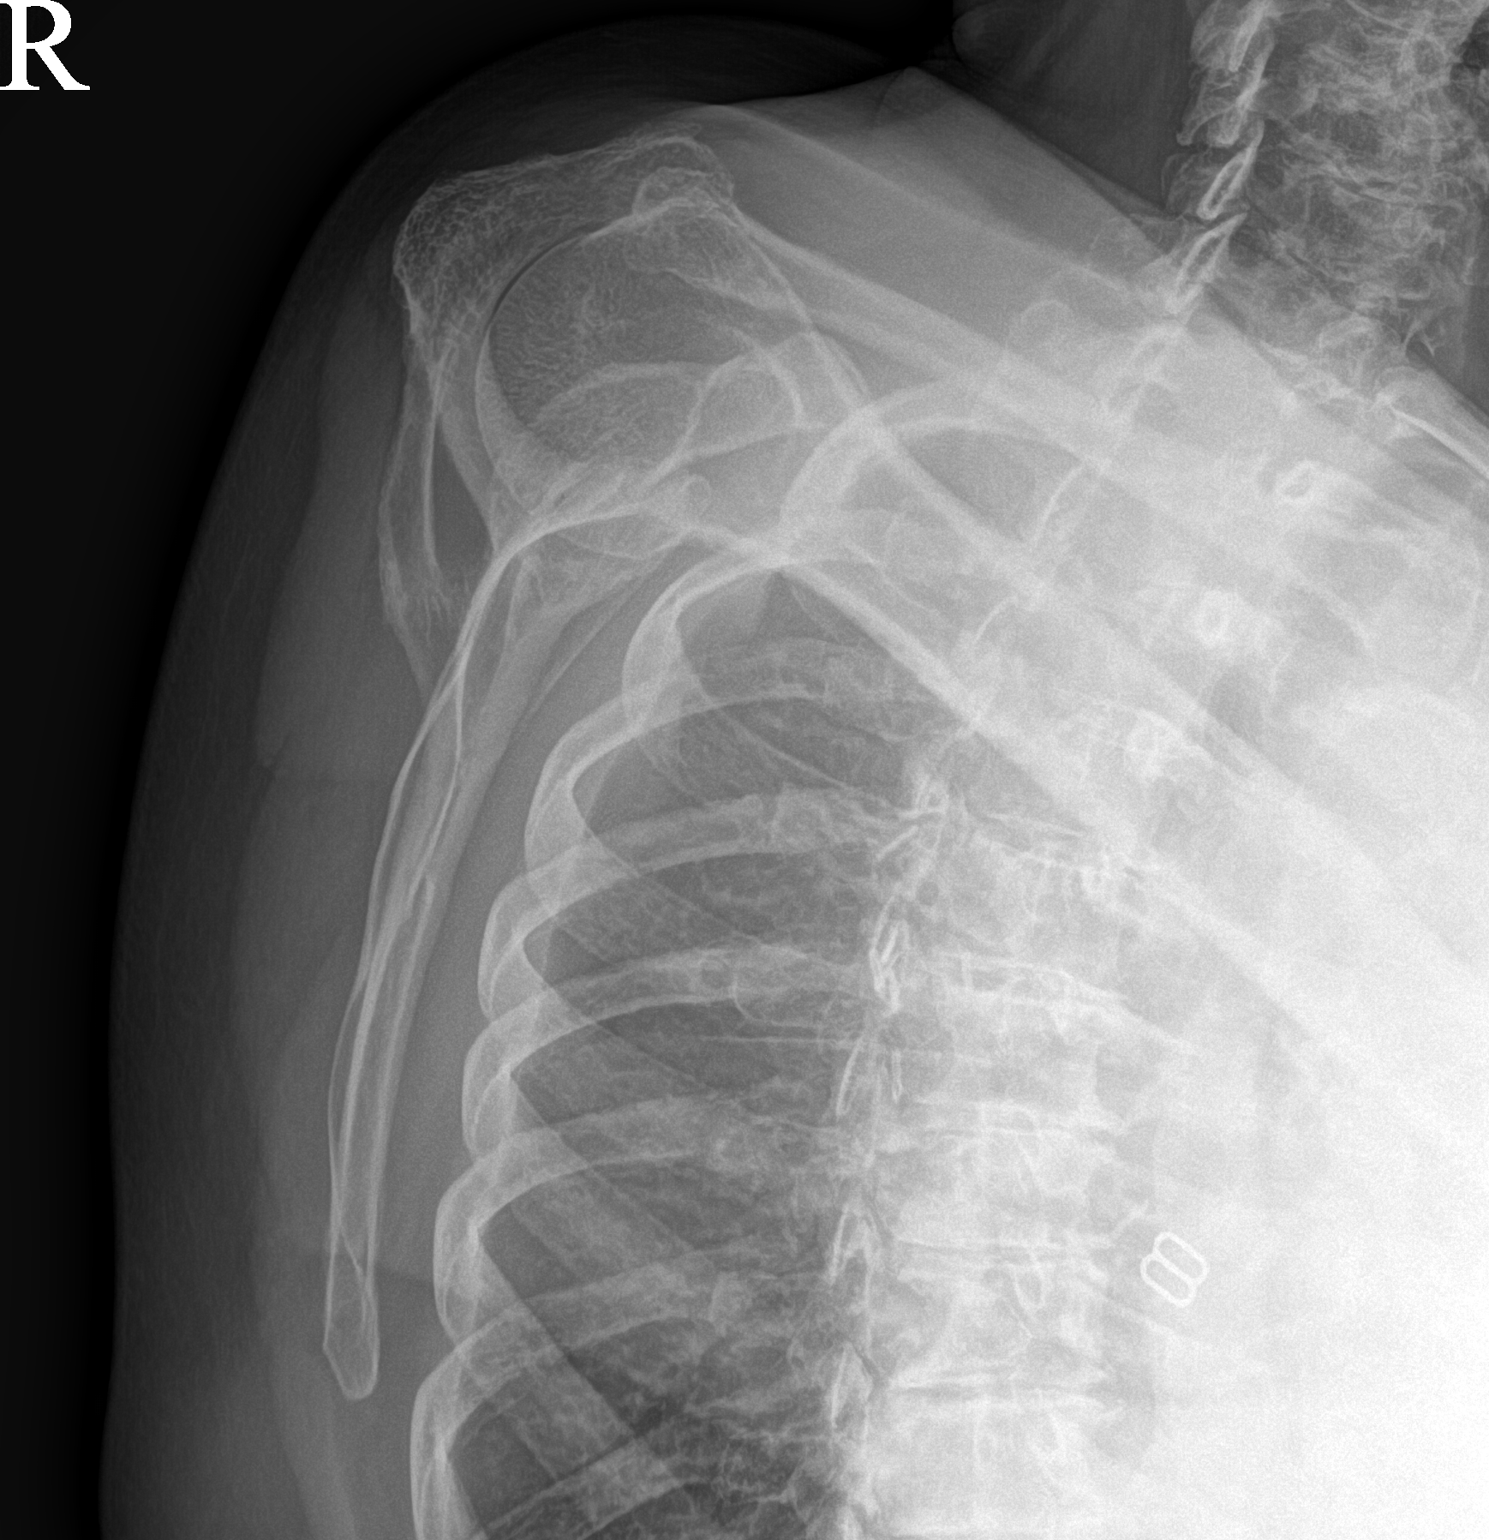

[shoulder axial]
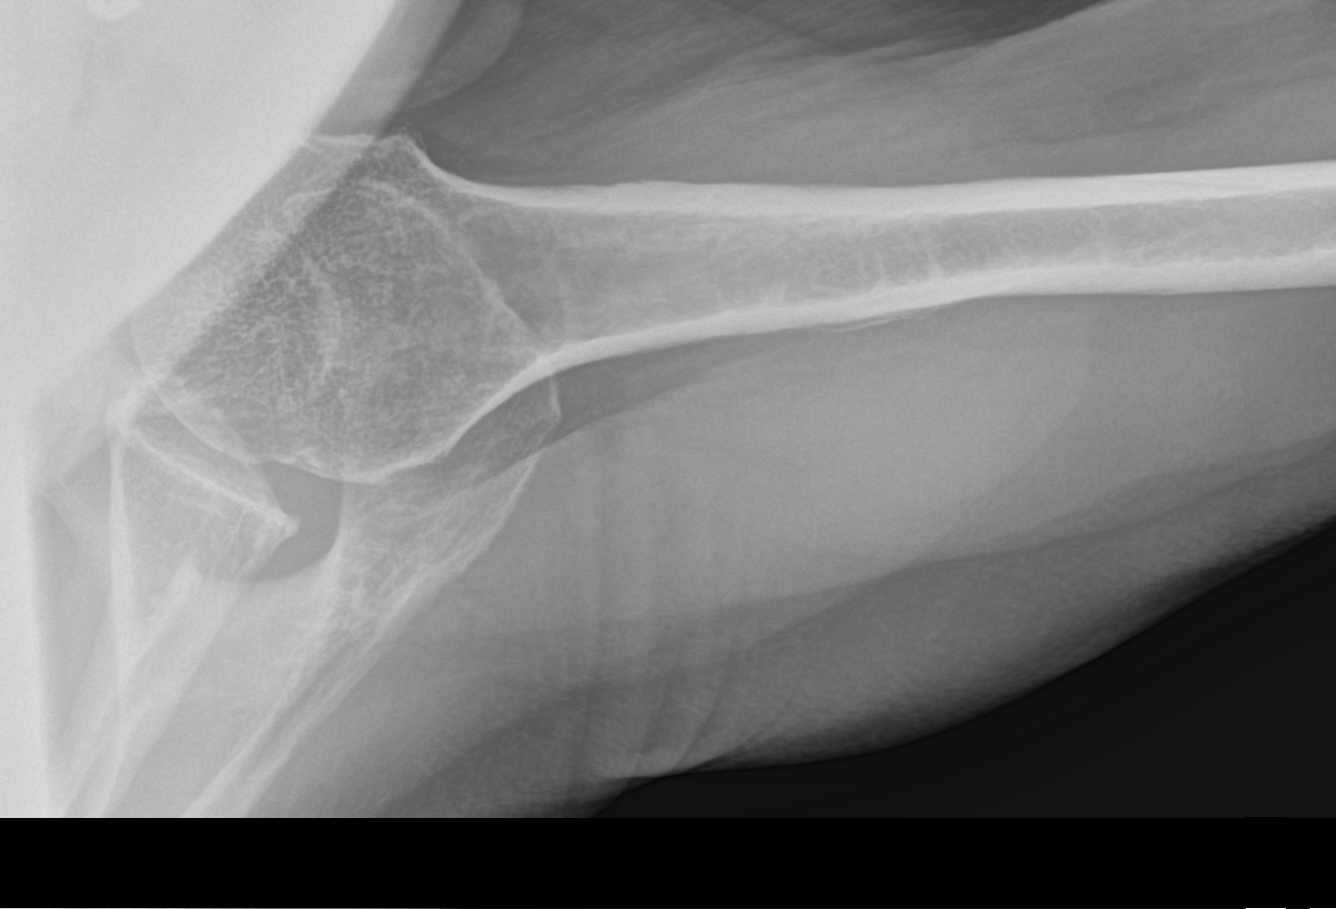

[3 of 3 positions shown; findings below may reference images not displayed]

FINDINGS: There is no evidence of fracture or dislocation. Mild degenerative
changes are seen involving the right acromioclavicular joint and
right glenohumeral articulation. Radiopaque surgical clips are seen
within the soft tissues of the right chest.
IMPRESSION: Mild degenerative changes, as described above.

## 2022-12-28 ENCOUNTER — Other Ambulatory Visit: Payer: Self-pay | Admitting: Oncology

## 2022-12-28 DIAGNOSIS — Z006 Encounter for examination for normal comparison and control in clinical research program: Secondary | ICD-10-CM

## 2023-01-11 NOTE — Progress Notes (Unsigned)
HPI: Courtney [name] is a 77 y.o. female, who is here today for her routine physical.  Last CPE: 2023  Regular exercise 3 or more time per week: *** Following a healthy diet: ***  Chronic medical problems: ***  Immunization History  Administered Date(s) Administered   Fluad Quad(high Dose 65+) 01/29/2019, 02/23/2021, 03/05/2022   Influenza Split 02/18/2012   Influenza, High Dose Seasonal PF 02/16/2013, 03/06/2015, 01/06/2016, 01/10/2017, 03/07/2018   Influenza,inj,Quad PF,6+ Mos 02/19/2014   Influenza-Unspecified 12/31/2015, 01/29/2019, 01/24/2020   PFIZER(Purple Top)SARS-COV-2 Vaccination 05/24/2019, 06/14/2019, 01/24/2020   PNEUMOCOCCAL CONJUGATE-20 03/16/2021   PPD Test 04/17/2012   Pneumococcal Conjugate-13 01/03/2015   Pneumococcal Polysaccharide-23 05/03/2008, 02/22/2013   Tdap 02/18/2012   Zoster Recombinant(Shingrix) 03/21/2017, 03/16/2018, 03/16/2018   Zoster, Live 01/14/2010   Health Maintenance  Topic Date Due   DTaP/Tdap/Td (2 - Td or Tdap) 02/17/2022   INFLUENZA VACCINE  12/02/2022   COVID-19 Vaccine (4 - 2023-24 season) 01/02/2023   Medicare Annual Wellness (AWV)  07/08/2023   Pneumonia Vaccine 63+ Years old  Completed   DEXA SCAN  Completed   Hepatitis C Screening  Completed   Zoster Vaccines- Shingrix  Completed   HPV VACCINES  Aged Out   Colonoscopy  Discontinued    She has *** concerns today.  Review of Systems  Current Outpatient Medications on File Prior to Visit  Medication Sig Dispense Refill   albuterol (VENTOLIN HFA) 108 (90 Base) MCG/ACT inhaler Inhale 2 puffs into the lungs every 6 (six) hours as needed for wheezing or shortness of breath. 8 g 3   amLODipine (NORVASC) 5 MG tablet TAKE ONE TABLET DAILY 90 tablet 2   amoxicillin-clavulanate (AUGMENTIN) 875-125 MG tablet Take 1 tablet by mouth 2 (two) times daily. 14 tablet 0   aspirin 81 MG tablet Take 81 mg by mouth daily.     budesonide-formoterol (SYMBICORT) 160-4.5 MCG/ACT inhaler  INHALE 1 TO 2 PUFFS EVERY 12 HOURS AS NEEDED. GARGLE AND SPIT AFTER USE 10.2 g 11   calcium citrate-vitamin D (CITRACAL+D) 315-200 MG-UNIT per tablet Take 1 tablet by mouth daily.     Carboxymethylcellulose Sod PF (RETAINE CMC) 0.5 % SOLN Apply to eye.     Cholecalciferol (VITAMIN D3) 1000 UNITS CAPS Take by mouth daily.     co-enzyme Q-10 30 MG capsule Take 50 mg by mouth daily.     FINACEA 15 % cream Apply 1 application topically 2 (two) times daily. For rosacea  0   hydrochlorothiazide (MICROZIDE) 12.5 MG capsule TAKE ONE CAPSULE DAILY 90 capsule 0   losartan (COZAAR) 100 MG tablet TAKE ONE TABLET DAILY 90 tablet 1   Misc Natural Products (TART CHERRY ADVANCED PO) Take 2,400 mg by mouth daily.     montelukast (SINGULAIR) 10 MG tablet TAKE 1 TABLET(10 MG) BY MOUTH AT BEDTIME 90 tablet 3   Multiple Vitamins-Minerals (MULTIVITAMIN WITH MINERALS) tablet Take 1 tablet by mouth daily.     nystatin (MYCOSTATIN) 100000 UNIT/ML suspension Take 5 mLs (500,000 Units total) by mouth 4 (four) times daily. Swish and swallow 60 mL 0   OMEGA 3 1000 MG CAPS Take 1,000 mg by mouth daily.     pantoprazole (PROTONIX) 40 MG tablet TAKE ONE TABLET DAILY 90 tablet 2   psyllium (METAMUCIL) 58.6 % powder Take 1 packet by mouth daily.     rosuvastatin (CRESTOR) 5 MG tablet TAKE ONE TABLET BY MOUTH EVERY DAY 90 tablet 1   No current facility-administered medications on file prior to visit.  Past Medical History:  Diagnosis Date   Asthma    Breast cancer (HCC) 11/2010   post double mastectomy; Dr Welton Flakes   Diverticulosis    Family history of breast cancer    Fundic gland polyps of stomach, benign 07/2022   GERD (gastroesophageal reflux disease)    S/P dilation X 1   Hemorrhoids    Hypertension    Night sweats    OSA (obstructive sleep apnea) 08/03/2017   Very mild with AHI 7.2/hr.     Prolapsed internal hemorrhoids, grade 3 06/27/2017   Grade 2 on anoscopy exam but grade 3 by history and seen a colonoscopy  as well All 3 columns banded 06/27/2017  09/21/2017 banded RP and RA     Reactive airway disease     Past Surgical History:  Procedure Laterality Date   ABDOMINAL HYSTERECTOMY     bilateral breast reconstruction  Jan 2013   BREAST SURGERY  01/13/11   bilateral total mastectomy; Dr Derrell Lolling   COLONOSCOPY  2014   Dr Juanda Chance; hyperplastic polyp   ESOPHAGEAL DILATION  2014   HEMORRHOID BANDING  2019   February and May   NASAL SEPTUM SURGERY     RECONSTRUCTION / CORRECTION OF NIPPLE / Rafael Bihari  June 2013   THYROGLOSSAL DUCT CYST      No Known Allergies  Family History  Problem Relation Age of Onset   Breast cancer Mother 71   Colon cancer Mother 18   Cervical cancer Mother 102       s/p hysterectomy   Heart attack Father 12   Diabetes Father        probable pre DM   Skin cancer Father        maybe melanoma   Cancer Sister 53       appendiceal   Heart attack Maternal Grandfather 57   Heart attack Paternal Uncle        X2 ; >50y   Heart attack Maternal Uncle        d. 47   Parkinson's disease Maternal Grandmother        d. 11   Heart attack Paternal Grandfather    Breast cancer Other        unspecified age; maternal great aunt (MGM's sister)   Breast cancer Cousin        maternal 1st cousin, once-removed; unspecified age   Heart attack Paternal Uncle        >50y   Stroke Neg Hx     Social History   Socioeconomic History   Marital status: Married    Spouse name: Not on file   Number of children: Not on file   Years of education: Not on file   Highest education level: Bachelor's degree (e.g., BA, AB, BS)  Occupational History   Not on file  Tobacco Use   Smoking status: Former    Current packs/day: 0.00    Types: Cigarettes    Start date: 05/03/1960    Quit date: 05/03/1965    Years since quitting: 57.7   Smokeless tobacco: Never   Tobacco comments:    smoked 1962-1967, less than 1 ppd  Vaping Use   Vaping status: Never Used  Substance and Sexual Activity    Alcohol use: Yes    Alcohol/week: 14.0 standard drinks of alcohol    Types: 14 Glasses of wine per week    Comment: 2-3 /day   Drug use: No   Sexual activity: Yes  Other Topics Concern  Not on file  Social History Narrative   Married   2-3 glasses wine/day   remote small vol smmoker   Social Determinants of Health   Financial Resource Strain: Low Risk  (07/06/2021)   Overall Financial Resource Strain (CARDIA)    Difficulty of Paying Living Expenses: Not hard at all  Food Insecurity: No Food Insecurity (07/06/2021)   Hunger Vital Sign    Worried About Running Out of Food in the Last Year: Never true    Ran Out of Food in the Last Year: Never true  Transportation Needs: No Transportation Needs (07/06/2021)   PRAPARE - Administrator, Civil Service (Medical): No    Lack of Transportation (Non-Medical): No  Physical Activity: Sufficiently Active (07/06/2021)   Exercise Vital Sign    Days of Exercise per Week: 6 days    Minutes of Exercise per Session: 60 min  Stress: No Stress Concern Present (07/06/2021)   [employer] of Occupational Health - Occupational Stress Questionnaire    Feeling of Stress : Not at all  Social Connections: Socially Integrated (07/06/2021)   Social Connection and Isolation Panel [NHANES]    Frequency of Communication with Friends and Family: More than three times a week    Frequency of Social Gatherings with Friends and Family: More than three times a week    Attends Religious Services: More than 4 times per year    Active Member of [employer] or Organizations: Yes    Attends Engineer, structural: More than 4 times per year    Marital Status: Married    There were no vitals filed for this visit. There is no height or weight on file to calculate BMI.  Wt Readings from Last 3 Encounters:  12/02/22 108 lb (49 kg)  10/28/22 109 lb (49.4 kg)  07/23/22 115 lb (52.2 kg)    Physical Exam  ASSESSMENT AND PLAN: Ms. KARCYN LOSANO was here  today annual physical examination.  No orders of the defined types were placed in this encounter.   There are no diagnoses linked to this encounter.  There are no diagnoses linked to this encounter.  No follow-ups on file.  Azeez Dunker G. [name], MD  [facility]. [place] [facility].

## 2023-01-12 ENCOUNTER — Encounter: Payer: Self-pay | Admitting: Family Medicine

## 2023-01-12 ENCOUNTER — Ambulatory Visit (INDEPENDENT_AMBULATORY_CARE_PROVIDER_SITE_OTHER): Payer: Medicare PPO | Admitting: Family Medicine

## 2023-01-12 ENCOUNTER — Ambulatory Visit (INDEPENDENT_AMBULATORY_CARE_PROVIDER_SITE_OTHER): Payer: Medicare PPO

## 2023-01-12 VITALS — BP 110/74 | HR 64 | Temp 98.5°F | Resp 16 | Ht <= 58 in | Wt 108.0 lb

## 2023-01-12 DIAGNOSIS — R202 Paresthesia of skin: Secondary | ICD-10-CM

## 2023-01-12 DIAGNOSIS — K219 Gastro-esophageal reflux disease without esophagitis: Secondary | ICD-10-CM

## 2023-01-12 DIAGNOSIS — R200 Anesthesia of skin: Secondary | ICD-10-CM

## 2023-01-12 DIAGNOSIS — R0989 Other specified symptoms and signs involving the circulatory and respiratory systems: Secondary | ICD-10-CM

## 2023-01-12 DIAGNOSIS — R0609 Other forms of dyspnea: Secondary | ICD-10-CM | POA: Diagnosis not present

## 2023-01-12 DIAGNOSIS — R002 Palpitations: Secondary | ICD-10-CM

## 2023-01-12 DIAGNOSIS — E785 Hyperlipidemia, unspecified: Secondary | ICD-10-CM

## 2023-01-12 DIAGNOSIS — R230 Cyanosis: Secondary | ICD-10-CM | POA: Diagnosis not present

## 2023-01-12 DIAGNOSIS — Z23 Encounter for immunization: Secondary | ICD-10-CM

## 2023-01-12 DIAGNOSIS — R7303 Prediabetes: Secondary | ICD-10-CM

## 2023-01-12 DIAGNOSIS — I10 Essential (primary) hypertension: Secondary | ICD-10-CM

## 2023-01-12 DIAGNOSIS — Z0000 Encounter for general adult medical examination without abnormal findings: Secondary | ICD-10-CM | POA: Diagnosis not present

## 2023-01-12 DIAGNOSIS — I700 Atherosclerosis of aorta: Secondary | ICD-10-CM | POA: Diagnosis not present

## 2023-01-12 LAB — LIPID PANEL
Cholesterol: 164 mg/dL (ref 0–200)
HDL: 85.2 mg/dL (ref >39.00)
LDL Cholesterol: 66 mg/dL (ref 0–99)
NonHDL: 78.85
Total CHOL/HDL Ratio: 2
Triglycerides: 62 mg/dL (ref 0.0–149.0)
VLDL: 12.4 mg/dL (ref 0.0–40.0)

## 2023-01-12 LAB — CBC
HCT: 41.1 % (ref 36.0–46.0)
Hemoglobin: 13.4 g/dL (ref 12.0–15.0)
MCHC: 32.5 g/dL (ref 30.0–36.0)
MCV: 91.6 fl (ref 78.0–100.0)
Platelets: 228 10*3/uL (ref 150.0–400.0)
RBC: 4.49 Mil/uL (ref 3.87–5.11)
RDW: 14.1 % (ref 11.5–15.5)
WBC: 4.7 10*3/uL (ref 4.0–10.5)

## 2023-01-12 LAB — COMPREHENSIVE METABOLIC PANEL
ALT: 26 U/L (ref 0–35)
AST: 25 U/L (ref 0–37)
Albumin: 4.5 g/dL (ref 3.5–5.2)
Alkaline Phosphatase: 37 U/L — ABNORMAL LOW (ref 39–117)
BUN: 21 mg/dL (ref 6–23)
CO2: 30 meq/L (ref 19–32)
Calcium: 10.4 mg/dL (ref 8.4–10.5)
Chloride: 104 meq/L (ref 96–112)
Creatinine, Ser: 0.78 mg/dL (ref 0.40–1.20)
GFR: 72.41 mL/min (ref >60.00)
Glucose, Bld: 93 mg/dL (ref 70–99)
Potassium: 4.5 meq/L (ref 3.5–5.1)
Sodium: 140 meq/L (ref 135–145)
Total Bilirubin: 1.1 mg/dL (ref 0.2–1.2)
Total Protein: 7.3 g/dL (ref 6.0–8.3)

## 2023-01-12 LAB — C-REACTIVE PROTEIN: CRP: <1 mg/dL (ref 0.5–20.0)

## 2023-01-12 LAB — VITAMIN B12: Vitamin B-12: 712 pg/mL (ref 211–911)

## 2023-01-12 LAB — HEMOGLOBIN A1C: Hgb A1c MFr Bld: 6 % (ref 4.6–6.5)

## 2023-01-12 NOTE — Patient Instructions (Addendum)
A few things to remember from today's visit:  Routine general medical examination at a health care facility  Primary hypertension  Prediabetes - Plan: Hemoglobin A1c  Hyperlipidemia, unspecified hyperlipidemia type - Plan: Comprehensive metabolic panel, Lipid panel  Cyanosis of fingertip - Plan: C-reactive protein, ANA, CBC  Numbness and tingling - Plan: C-reactive protein, Vitamin B12, CBC  Decreased pulses in feet - Plan: VAS [place] ABI WITH/WO TBI  Decrease alcohol intake, max 2-3 oz daily. Monitor for changes in fingers.  If you need refills for medications you take chronically, please call your pharmacy. Do not use My Chart to request refills or for acute issues that need immediate attention. If you send a my chart message, it may take a few days to be addressed, specially if I am not in the office.  Please be sure medication list is accurate. If a new problem present, please set up appointment sooner than planned today.

## 2023-01-12 NOTE — Assessment & Plan Note (Signed)
She reports that she is still having episodes of heartburn, usually related with greasy and spicy food. Recommend continuing same dose of pantoprazole. Improve GERD precautions. If symptoms are persistent after changing his diet, we will need to consider GI evaluation.

## 2023-01-12 NOTE — Assessment & Plan Note (Signed)
We discussed the importance of regular physical activity and healthy diet for prevention of chronic illness and/or complications. Preventive guidelines reviewed. Vaccination: It seems like she is due for Tdap vaccination.  She mentioned that she is planning to go to [place] in 02/2023, no concerns about this.  Recommend updating vaccination at her pharmacy or to consider arranging appointment at the travel clinic. Ca++ and vit D supplementation to continue. Next CPE in a year.

## 2023-01-12 NOTE — Assessment & Plan Note (Signed)
Hemoglobin A1c was 6.3 in 10/2021. Encouraged a healthy lifestyle for diabetes prevention. Further recommendation will be given according to hemoglobin A1c result.

## 2023-01-12 NOTE — Assessment & Plan Note (Signed)
BP adequately controlled. Continue losartan 100 mg daily, HCTZ 12.5 mg daily, and amlodipine 5 mg daily. Instructed to monitor BP regularly. Continue low-salt diet.

## 2023-01-12 NOTE — Assessment & Plan Note (Signed)
Continue rosuvastatin 5 mg daily. Further recommendation will be given according to lipid panel result.

## 2023-01-12 NOTE — Assessment & Plan Note (Signed)
We discussed possible etiologies. She has history of asthma, which could be a contributing factor. She is not having associated CP but reports episodes of palpitations. Cardiology referral placed. Chest x-ray ordered today. Instructed about warning signs.

## 2023-01-12 NOTE — Progress Notes (Signed)
HPI: Courtney [name] is a 77 y.o. female, who is here today for her routine physical. She also has a few concerns she would like to discuss today.   Last CPE: 07/2021. Does water aerobics 2 days a week and walks about 3 days a week.  She tends to eat home cooked meals, has vegetables daily, and regularly eats fish.  On average she is sleeping about 8-9 hours a night. She quit smoking in 1967, only did in college.  Currently is drinking 1-2 glasses, previously would drink 2-3 glasses.   She reports she has lost weight in the last year, has decreased oral intake. She has been trying to reduce her alcohol intake.  Immunization History  Administered Date(s) Administered   Fluad Quad(high Dose 65+) 01/29/2019, 02/23/2021, 03/05/2022   Fluad Trivalent(High Dose 65+) 01/12/2023   Influenza Split 02/18/2012   Influenza, High Dose Seasonal PF 02/16/2013, 03/06/2015, 01/06/2016, 01/10/2017, 03/07/2018   Influenza,inj,Quad PF,6+ Mos 02/19/2014   Influenza-Unspecified 12/31/2015, 01/29/2019, 01/24/2020   PFIZER(Purple Top)SARS-COV-2 Vaccination 05/24/2019, 06/14/2019, 01/24/2020   PNEUMOCOCCAL CONJUGATE-20 03/16/2021   PPD Test 04/17/2012   Pneumococcal Conjugate-13 01/03/2015   Pneumococcal Polysaccharide-23 05/03/2008, 02/22/2013   Tdap 02/18/2012   Zoster Recombinant(Shingrix) 03/21/2017, 03/16/2018, 03/16/2018   Zoster, Live 01/14/2010   Health Maintenance  Topic Date Due   DTaP/Tdap/Td (2 - Td or Tdap) 02/17/2022   COVID-19 Vaccine (4 - 2023-24 season) 01/28/2023 (Originally 01/02/2023)   Medicare Annual Wellness (AWV)  07/08/2023   Pneumonia Vaccine 46+ Years old  Completed   INFLUENZA VACCINE  Completed   DEXA SCAN  Completed   Hepatitis C Screening  Completed   Zoster Vaccines- Shingrix  Completed   HPV VACCINES  Aged Out   Colonoscopy  Discontinued  She does not require mammograms anymore, had a double mastectomy.   -"Carpal tunnel": She complains of intermittent  numbness/tingling in extremities, which improved after discontinuing Singulair.  Is still having numbness in her left index finger and thumb associated with cyanosis of fingertips, which starting a few months ago.  She wonders if this is caused by carpal tunnel syndrome. Denies fingertips pain or ulcers.  She notes these have occurred when using her phone, holding the dumbbells too tight when exercising, or when lying on her right side.   Also talks cyanosis and cold feet.  She reports having "hammer toes" and "metatarsal problems" on her left foot.   -Shortness of breath: She complains of shortness of breath for several months.  Exacerbated by activities like going up stairs or walking a hill. Denies any associated CP, diaphoresis, wheezing or cough.  Asthma on Symbicort 160-4.5 mcg twice daily and albuterol inhaler to use as needed. CXR in 06/2021 done due to cough and shortness of breath was negative for acute abnormalities.  -Pre-Diabetes: Negative for polydipsia, polyuria, polyphagia. Lab Results  Component Value Date   HGBA1C 6.3 11/17/2021   -Hypertension: Currently she is on amlodipine 5 mg daily, losartan 100 mg daily, and hydrochlorothiazide 12.5 mg daily.  Not checking her BP at home.  Last eye exam was about 1 year ago, and has her next appt in the next few weeks.  Negative for unusual or severe headache, visual changes, exertional chest pain, focal weakness, or edema.  Lab Results  Component Value Date   CREATININE 0.68 11/17/2021   BUN 17 11/17/2021   NA 143 11/17/2021   K 4.1 11/17/2021   CL 104 11/17/2021   CO2 30 11/17/2021   Palpitations:She reports her "heart  racing" has been happening "for a long time" These episodes tend to mostly occur when she first wakes up, but she denies it wakes her up.  Previously was seen by cardiology, but notes she hasn't been in a while.  No associated nausea or epigastric abdominal pain.Marland Kitchen   GERD:She is currently on pantoprazole 40  mg daily.  She still gets occasional  acid reflux even with taking pantoprazole, which she notes tends to happen when she eats fatty foods.   HLD: She is currently on rosuvastatin 5 mg daily.  Has been managing a healthy diet.   Component     Latest Ref Rng 06/29/2021  Cholesterol     0 - 200 mg/dL 829   Triglycerides     0.0 - 149.0 mg/dL 562.1   HDL Cholesterol     >39.00 mg/dL 30.86   VLDL     0.0 - 40.0 mg/dL 57.8   LDL (calc)     0 - 99 mg/dL 81   Total CHOL/HDL Ratio 2   NonHDL 101.87    Review of Systems  Constitutional:  Positive for activity change. Negative for appetite change, chills and fever.  HENT:  Negative for congestion, hearing loss, mouth sores, rhinorrhea, sore throat and trouble swallowing.   Eyes:  Negative for redness and visual disturbance.  Respiratory:  Positive for shortness of breath. Negative for cough and wheezing.   Cardiovascular:  Positive for palpitations. Negative for chest pain and leg swelling.  Gastrointestinal:  Negative for abdominal pain, nausea and vomiting.       No changes in bowel habits.  Endocrine: Negative for cold intolerance, heat intolerance, polydipsia, polyphagia and polyuria.  Genitourinary:  Negative for decreased urine volume, dysuria and hematuria.  Musculoskeletal:  Positive for arthralgias. Negative for gait problem.  Skin:  Positive for color change. Negative for pallor and rash.  Allergic/Immunologic: Positive for environmental allergies.  Neurological:  Positive for numbness. Negative for syncope, weakness and headaches.  Psychiatric/Behavioral:  Negative for confusion and hallucinations.   All other systems reviewed and are negative.  Current Outpatient Medications on File Prior to Visit  Medication Sig Dispense Refill   albuterol (VENTOLIN HFA) 108 (90 Base) MCG/ACT inhaler Inhale 2 puffs into the lungs every 6 (six) hours as needed for wheezing or shortness of breath. 8 g 3   amLODipine (NORVASC) 5 MG tablet  TAKE ONE TABLET DAILY 90 tablet 2   aspirin 81 MG tablet Take 81 mg by mouth daily.     budesonide-formoterol (SYMBICORT) 160-4.5 MCG/ACT inhaler INHALE 1 TO 2 PUFFS EVERY 12 HOURS AS NEEDED. GARGLE AND SPIT AFTER USE 10.2 g 11   calcium citrate-vitamin D (CITRACAL+D) 315-200 MG-UNIT per tablet Take 1 tablet by mouth daily.     Carboxymethylcellulose Sod PF (RETAINE CMC) 0.5 % SOLN Apply to eye.     Cholecalciferol (VITAMIN D3) 1000 UNITS CAPS Take by mouth daily.     co-enzyme Q-10 30 MG capsule Take 50 mg by mouth daily.     FINACEA 15 % cream Apply 1 application topically 2 (two) times daily. For rosacea  0   hydrochlorothiazide (MICROZIDE) 12.5 MG capsule TAKE ONE CAPSULE DAILY 90 capsule 0   losartan (COZAAR) 100 MG tablet TAKE ONE TABLET DAILY 90 tablet 1   Misc Natural Products (TART CHERRY ADVANCED PO) Take 2,400 mg by mouth daily.     Multiple Vitamins-Minerals (MULTIVITAMIN WITH MINERALS) tablet Take 1 tablet by mouth daily.     OMEGA 3 1000 MG  CAPS Take 1,000 mg by mouth daily.     pantoprazole (PROTONIX) 40 MG tablet TAKE ONE TABLET DAILY 90 tablet 2   psyllium (METAMUCIL) 58.6 % powder Take 1 packet by mouth daily.     rosuvastatin (CRESTOR) 5 MG tablet TAKE ONE TABLET BY MOUTH EVERY DAY 90 tablet 1   No current facility-administered medications on file prior to visit.   Past Medical History:  Diagnosis Date   Asthma    Breast cancer (HCC) 11/2010   post double mastectomy; Dr Welton Flakes   Diverticulosis    Family history of breast cancer    Fundic gland polyps of stomach, benign 07/2022   GERD (gastroesophageal reflux disease)    S/P dilation X 1   Hemorrhoids    Hypertension    Night sweats    OSA (obstructive sleep apnea) 08/03/2017   Very mild with AHI 7.2/hr.     Prolapsed internal hemorrhoids, grade 3 06/27/2017   Grade 2 on anoscopy exam but grade 3 by history and seen a colonoscopy as well All 3 columns banded 06/27/2017  09/21/2017 banded RP and RA     Reactive airway  disease     Past Surgical History:  Procedure Laterality Date   ABDOMINAL HYSTERECTOMY     bilateral breast reconstruction  Jan 2013   BREAST SURGERY  01/13/11   bilateral total mastectomy; Dr Derrell Lolling   COLONOSCOPY  2014   Dr Juanda Chance; hyperplastic polyp   ESOPHAGEAL DILATION  2014   HEMORRHOID BANDING  2019   February and May   NASAL SEPTUM SURGERY     RECONSTRUCTION / CORRECTION OF NIPPLE / Rafael Bihari  June 2013   THYROGLOSSAL DUCT CYST      No Known Allergies  Family History  Problem Relation Age of Onset   Breast cancer Mother 67   Colon cancer Mother 65   Cervical cancer Mother 23       s/p hysterectomy   Heart attack Father 85   Diabetes Father        probable pre DM   Skin cancer Father        maybe melanoma   Cancer Sister 67       appendiceal   Heart attack Maternal Grandfather 57   Heart attack Paternal Uncle        X2 ; >50y   Heart attack Maternal Uncle        d. 42   Parkinson's disease Maternal Grandmother        d. 67   Heart attack Paternal Grandfather    Breast cancer Other        unspecified age; maternal great aunt (MGM's sister)   Breast cancer Cousin        maternal 1st cousin, once-removed; unspecified age   Heart attack Paternal Uncle        >50y   Stroke Neg Hx     Social History   Socioeconomic History   Marital status: Married    Spouse name: Not on file   Number of children: Not on file   Years of education: Not on file   Highest education level: Bachelor's degree (e.g., BA, AB, BS)  Occupational History   Not on file  Tobacco Use   Smoking status: Former    Current packs/day: 0.00    Types: Cigarettes    Start date: 05/03/1960    Quit date: 05/03/1965    Years since quitting: 57.7   Smokeless tobacco: Never   Tobacco comments:  smoked [phone], less than 1 ppd  Vaping Use   Vaping status: Never Used  Substance and Sexual Activity   Alcohol use: Yes    Alcohol/week: 14.0 standard drinks of alcohol    Types: 14 Glasses of  wine per week    Comment: 2-3 /day   Drug use: No   Sexual activity: Yes  Other Topics Concern   Not on file  Social History Narrative   Married   2-3 glasses wine/day   remote small vol smmoker   Social Determinants of Health   Financial Resource Strain: Low Risk  (07/06/2021)   Overall Financial Resource Strain (CARDIA)    Difficulty of Paying Living Expenses: Not hard at all  Food Insecurity: No Food Insecurity (07/06/2021)   Hunger Vital Sign    Worried About Running Out of Food in the Last Year: Never true    Ran Out of Food in the Last Year: Never true  Transportation Needs: No Transportation Needs (07/06/2021)   PRAPARE - Administrator, Civil Service (Medical): No    Lack of Transportation (Non-Medical): No  Physical Activity: Sufficiently Active (07/06/2021)   Exercise Vital Sign    Days of Exercise per Week: 6 days    Minutes of Exercise per Session: 60 min  Stress: No Stress Concern Present (07/06/2021)   [employer] of Occupational Health - Occupational Stress Questionnaire    Feeling of Stress : Not at all  Social Connections: Socially Integrated (07/06/2021)   Social Connection and Isolation Panel [NHANES]    Frequency of Communication with Friends and Family: More than three times a week    Frequency of Social Gatherings with Friends and Family: More than three times a week    Attends Religious Services: More than 4 times per year    Active Member of [employer] or Organizations: Yes    Attends Banker Meetings: More than 4 times per year    Marital Status: Married   Vitals:   01/12/23 0742  BP: 110/74  Pulse: 64  Resp: 16  Temp: 98.5 F (36.9 C)  SpO2: 97%   Body mass index is 24.21 kg/m.  Wt Readings from Last 3 Encounters:  01/12/23 108 lb (49 kg)  12/02/22 108 lb (49 kg)  10/28/22 109 lb (49.4 kg)   Physical Exam Vitals and nursing note reviewed.  Constitutional:      General: She is not in acute distress.    Appearance: She  is well-developed.  HENT:     Head: Normocephalic and atraumatic.     Right Ear: Tympanic membrane, ear canal and external ear normal.     Left Ear: Tympanic membrane, ear canal and external ear normal.     Mouth/Throat:     Mouth: Mucous membranes are moist.     Pharynx: Oropharynx is clear. Uvula midline.  Eyes:     Conjunctiva/sclera: Conjunctivae normal.     Pupils: Pupils are equal, round, and reactive to light.  Neck:     Thyroid: No thyromegaly.     Trachea: No tracheal deviation.  Cardiovascular:     Rate and Rhythm: Normal rate and regular rhythm.     Pulses:          Dorsalis pedis pulses are 2+ on the right side.       Posterior tibial pulses are 1+ on the left side.     Heart sounds: No murmur heard.    Comments: Left DP pulse difficult to locate. Toes  cyanosis as well as distal phalange of left thumb and index finger. Normal capillary refill. Pulmonary:     Effort: Pulmonary effort is normal. No respiratory distress.     Breath sounds: Normal breath sounds.  Abdominal:     Palpations: Abdomen is soft. There is no hepatomegaly or mass.     Tenderness: There is no abdominal tenderness.  Genitourinary:    Comments: No concerns. Musculoskeletal:     Right hand: Normal capillary refill. Normal pulse.     Left hand: Normal capillary refill. Normal pulse.     Comments: No synovitis appreciated. IP nodules, hands. Left thumb and index distal phalange cyanosis.  Feet:     Comments: Good capillary refill Varicose veins Lymphadenopathy:     Cervical: No cervical adenopathy.  Skin:    General: Skin is warm.     Findings: No erythema or rash.  Neurological:     General: No focal deficit present.     Mental Status: She is alert and oriented to person, place, and time.     Cranial Nerves: No cranial nerve deficit.     Coordination: Coordination normal.     Gait: Gait normal.     Deep Tendon Reflexes:     Reflex Scores:      Bicep reflexes are 2+ on the right side  and 2+ on the left side.      Patellar reflexes are 2+ on the right side and 2+ on the left side. Psychiatric:        Mood and Affect: Affect normal. Mood is anxious.   ASSESSMENT AND PLAN: Ms. SARASWATI SCHEMPP was here today annual physical examination.  Orders Placed This Encounter  Procedures   DG Chest 2 View   Flu Vaccine Trivalent High Dose (Fluad)   Comprehensive metabolic panel   Hemoglobin A1c   Lipid panel   C-reactive protein   Vitamin B12   ANA   CBC   Ambulatory referral to Cardiology   VAS [place] ABI WITH/WO TBI   Lab Results  Component Value Date   ALT 26 01/12/2023   AST 25 01/12/2023   ALKPHOS 37 (L) 01/12/2023   BILITOT 1.1 01/12/2023   Lab Results  Component Value Date   NA 140 01/12/2023   CL 104 01/12/2023   K 4.5 01/12/2023   CO2 30 01/12/2023   BUN 21 01/12/2023   CREATININE 0.78 01/12/2023   GFR 72.41 01/12/2023   CALCIUM 10.4 01/12/2023   ALBUMIN 4.5 01/12/2023   GLUCOSE 93 01/12/2023    Lab Results  Component Value Date   WBC 4.7 01/12/2023   HGB 13.4 01/12/2023   HCT 41.1 01/12/2023   MCV 91.6 01/12/2023   PLT 228.0 01/12/2023   Lab Results  Component Value Date   CHOL 164 01/12/2023   HDL 85.20 01/12/2023   LDLCALC 66 01/12/2023   LDLDIRECT 131.3 02/29/2012   TRIG 62.0 01/12/2023   CHOLHDL 2 01/12/2023   Lab Results  Component Value Date   HGBA1C 6.0 01/12/2023   Lab Results  Component Value Date   VITAMINB12 712 01/12/2023   Lab Results  Component Value Date   CRP <1.0 01/12/2023   Routine general medical examination at a health care facility Assessment & Plan: We discussed the importance of regular physical activity and healthy diet for prevention of chronic illness and/or complications. Preventive guidelines reviewed. Vaccination: It seems like she is due for Tdap vaccination.  She mentioned that she is planning to go to [place]  in 02/2023, no concerns about this.  Recommend updating vaccination at her pharmacy or to  consider arranging appointment at the travel clinic. Ca++ and vit D supplementation to continue. Next CPE in a year.  Primary hypertension Assessment & Plan: BP adequately controlled. Continue losartan 100 mg daily, HCTZ 12.5 mg daily, and amlodipine 5 mg daily. Instructed to monitor BP regularly. Continue low-salt diet.  Prediabetes Assessment & Plan: Hemoglobin A1c was 6.3 in 10/2021. Encouraged a healthy lifestyle for diabetes prevention. Further recommendation will be given according to hemoglobin A1c result.  Orders: -     Hemoglobin A1c; Future  Hyperlipidemia, unspecified hyperlipidemia type Assessment & Plan: Continue rosuvastatin 5 mg daily. Further recommendation will be given according to lipid panel result.  Orders: -     Comprehensive metabolic panel; Future -     Lipid panel; Future  Cyanosis of fingertip We discussed possible etiologies, including vascular disease, rheumatologic disorders among son. For now recommend adequate skin care. Further recommendation will be given according to lab results.  -     C-reactive protein; Future -     ANA; Future -     CBC; Future  Numbness and tingling -     C-reactive protein; Future -     Vitamin B12; Future -     CBC; Future  Decreased pulses in feet I have difficulty finding distal pulses + associated cyanosis. Further recommendation will be given according to ABI results. Continue appropriate skin care.  -     VAS [place] ABI WITH/WO TBI; Future  Need for influenza vaccination -     Flu Vaccine Trivalent High Dose (Fluad)  DOE (dyspnea on exertion) Assessment & Plan: We discussed possible etiologies. She has history of asthma, which could be a contributing factor. She is not having associated CP but reports episodes of palpitations. Cardiology referral placed. Chest x-ray ordered today. Instructed about warning signs.  Orders: -     DG Chest 2 View; Future -     Ambulatory referral to  Cardiology  Palpitations This seems to be a chronic problem, more frequent recently. Possible causes discussed. Further recommendation will be given according to lab results. Cardiology referral placed.  -     Ambulatory referral to Cardiology  Gastroesophageal reflux disease, unspecified whether esophagitis present Assessment & Plan: She reports that she is still having episodes of heartburn, usually related with greasy and spicy food. Recommend continuing same dose of pantoprazole. Improve GERD precautions. If symptoms are persistent after changing his diet, we will need to consider GI evaluation.  I spent a total of 61 minutes in both face to face and non face to face activities for this visit on the date of this encounter. During this time history was obtained and documented, examination was performed, prior labs/imaging reviewed, and assessment/plan discussed.  Return in 4 months (on 05/14/2023) for chronic problems.  I,Rachel Rivera,acting as a scribe for Doron Shake [name], MD.,have documented all relevant documentation on the behalf of Karoline Fleer [name], MD,as directed by  Kippy Melena [name], MD while in the presence of Issaiah Seabrooks [name], MD.  I, Isabelle Course, have reviewed all documentation for this visit. The documentation on 01/12/23 for the exam, diagnosis, procedures, and orders are all accurate and complete.  Chandlor Noecker G. [name], MD  [facility]. [place] office.

## 2023-01-15 LAB — ANA: Anti Nuclear Antibody (ANA): NEGATIVE

## 2023-01-18 DIAGNOSIS — H524 Presbyopia: Secondary | ICD-10-CM | POA: Diagnosis not present

## 2023-01-18 DIAGNOSIS — H04123 Dry eye syndrome of bilateral lacrimal glands: Secondary | ICD-10-CM | POA: Diagnosis not present

## 2023-01-18 DIAGNOSIS — H2513 Age-related nuclear cataract, bilateral: Secondary | ICD-10-CM | POA: Diagnosis not present

## 2023-01-18 DIAGNOSIS — H353121 Nonexudative age-related macular degeneration, left eye, early dry stage: Secondary | ICD-10-CM | POA: Diagnosis not present

## 2023-01-18 DIAGNOSIS — H5202 Hypermetropia, left eye: Secondary | ICD-10-CM | POA: Diagnosis not present

## 2023-01-18 DIAGNOSIS — H353112 Nonexudative age-related macular degeneration, right eye, intermediate dry stage: Secondary | ICD-10-CM | POA: Diagnosis not present

## 2023-01-24 ENCOUNTER — Other Ambulatory Visit: Payer: Self-pay | Admitting: Family Medicine

## 2023-01-24 DIAGNOSIS — I10 Essential (primary) hypertension: Secondary | ICD-10-CM

## 2023-01-25 ENCOUNTER — Ambulatory Visit (HOSPITAL_COMMUNITY)
Admission: RE | Admit: 2023-01-25 | Discharge: 2023-01-25 | Disposition: A | Discharge status: Home / Self Care | Payer: Medicare PPO | Source: Ambulatory Visit | Attending: Family Medicine | Admitting: Family Medicine

## 2023-01-25 DIAGNOSIS — R0989 Other specified symptoms and signs involving the circulatory and respiratory systems: Secondary | ICD-10-CM | POA: Diagnosis not present

## 2023-01-25 LAB — VAS US ABI WITH/WO TBI
Left ABI: 1.03
Right ABI: 1.18

## 2023-02-07 ENCOUNTER — Inpatient Hospital Stay: Payer: Medicare PPO | Attending: Adult Health | Admitting: Adult Health

## 2023-02-07 ENCOUNTER — Encounter: Payer: Self-pay | Admitting: Adult Health

## 2023-02-07 VITALS — BP 118/61 | HR 59 | Temp 97.4°F | Resp 18 | Ht <= 58 in | Wt 106.9 lb

## 2023-02-07 DIAGNOSIS — Z853 Personal history of malignant neoplasm of breast: Secondary | ICD-10-CM | POA: Diagnosis not present

## 2023-02-07 DIAGNOSIS — Z08 Encounter for follow-up examination after completed treatment for malignant neoplasm: Secondary | ICD-10-CM | POA: Insufficient documentation

## 2023-02-07 DIAGNOSIS — Z9013 Acquired absence of bilateral breasts and nipples: Secondary | ICD-10-CM | POA: Insufficient documentation

## 2023-02-07 NOTE — Progress Notes (Signed)
[facility] Cancer Follow up:    Courtney [name], [name], MD [address] [place] [address]   DIAGNOSIS: History of bilateral breast cancer  SUMMARY OF ONCOLOGIC HISTORY: (1) status post bilateral mastectomies with bilateral sentinel lymph node sampling 01/13/2011, showing             (a) on the left, 2 foci of invasive ductal carcinoma, the larger measuring 1.2 cm, grade 2, both tumors being strongly estrogen and progesterone receptor positive, with MIB-once of 13-14%, and HER-2/neu negative. Her (pT1c pN0 = stage IA)             (b) on the right, apT1c pN0, stage IA invasive ductal carcinoma, grade 2, estrogen receptor 100% positive, progesterone receptor 9% positive, with an MIB-1 of 12% and no HER-2 amplification   (2) Oncotype DX on the largest tumor showed a score of 10, suggesting a risk of recurrence outside of the breast within the next 10 years would be 7% if the patient only systemic therapy was tamoxifen for 5 years. It also predicts no benefit from chemotherapy   (3) letrozole from October of 2012 through 02/2016             A.  Most recent bone density in January 2023 was normal.    CURRENT THERAPY: observation  INTERVAL HISTORY: Courtney [name] 77 y.o. female returns for follow-up of her history of bilateral breast cancer.  She is doing well today.  At her last appointment she was concerned about her breast implants and wanting to know whether she should get them replaced.  She was seen by Dr. Odis Luster who ordered breast MRI and her implants were intact.  She continues to exercise regularly.  She is up-to-date with following up with her primary care provider along with dermatology.  She has some palpitations and has a new patient appointment with Dr. Allyson Sabal coming up.   Her most recent bone density was completed on May 07, 2021 at [facility] and showed a normal bone density with a T score of -1.0 in the left femur.    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   Bursitis of left hip 04/01/2021   Degenerative arthritis of right knee 12/05/2020   Metatarsalgia 12/05/2020   Scoliosis 09/11/2018   Rosacea 07/11/2018   OSA (obstructive sleep apnea) 08/03/2017   Prolapsed internal hemorrhoids, grade 3 max 06/27/2017   Wheezing 06/22/2017   Diverticulosis    Night sweats    Intermittent palpitations 01/10/2017   Mild intermittent asthma 08/31/2015   GERD (gastroesophageal reflux disease) 07/25/2015   Varicose veins of bilateral lower extremities with other complications 03/18/2015   Arthritis 05/27/2014   Skin cancer, basal cell 02/20/2014   Fasting hyperglycemia 02/29/2012   History of bilateral breast cancer 02/29/2012   Osteopenia 02/13/2012   HTN (hypertension) 04/06/2011   History of colonic polyps 01/14/2010   DIVERTICULOSIS, COLON 01/25/2007    has No Known Allergies.  MEDICAL HISTORY: Past Medical History:  Diagnosis Date   Asthma    Breast cancer (HCC) 11/2010   post double mastectomy; Dr Welton Flakes   Diverticulosis    Family history of breast cancer    Fundic  gland polyps of stomach, benign 07/2022   GERD (gastroesophageal reflux disease)    S/P dilation X 1   Hemorrhoids    Hypertension    Night sweats    OSA (obstructive sleep apnea) 08/03/2017   Very mild with AHI 7.2/hr.     Prolapsed internal hemorrhoids, grade 3 06/27/2017   Grade 2 on anoscopy exam but grade 3 by history and seen a colonoscopy as well All 3 columns banded 06/27/2017  09/21/2017 banded RP and RA     Reactive airway disease     SURGICAL HISTORY: Past Surgical History:  Procedure Laterality Date   ABDOMINAL HYSTERECTOMY     bilateral breast  reconstruction  Jan 2013   BREAST SURGERY  01/13/11   bilateral total mastectomy; Dr Derrell Lolling   COLONOSCOPY  2014   Dr Juanda Chance; hyperplastic polyp   ESOPHAGEAL DILATION  2014   HEMORRHOID BANDING  2019   February and May   NASAL SEPTUM SURGERY     RECONSTRUCTION / CORRECTION OF NIPPLE / Rafael Bihari  June 2013   THYROGLOSSAL DUCT CYST      SOCIAL HISTORY: Social History   Socioeconomic History   Marital status: Married    Spouse name: Not on file   Number of children: Not on file   Years of education: Not on file   Highest education level: Bachelor's degree (e.[name]., BA, AB, BS)  Occupational History   Not on file  Tobacco Use   Smoking status: Former    Current packs/day: 0.00    Types: Cigarettes    Start date: 05/03/1960    Quit date: 05/03/1965    Years since quitting: 57.8   Smokeless tobacco: Never   Tobacco comments:    smoked 1962-1967, less than 1 ppd  Vaping Use   Vaping status: Never Used  Substance and Sexual Activity   Alcohol use: Yes    Alcohol/week: 14.0 standard drinks of alcohol    Types: 14 Glasses of wine per week    Comment: 2-3 /day   Drug use: No   Sexual activity: Yes  Other Topics Concern   Not on file  Social History Narrative   Married   2-3 glasses wine/day   remote small vol smmoker   Social Determinants of Health   Financial Resource Strain: Low Risk  (07/06/2021)   Overall Financial Resource Strain (CARDIA)    Difficulty of Paying Living Expenses: Not hard at all  Food Insecurity: No Food Insecurity (07/06/2021)   Hunger Vital Sign    Worried About Running Out of Food in the Last Year: Never true    Ran Out of Food in the Last Year: Never true  Transportation Needs: No Transportation Needs (07/06/2021)   PRAPARE - Administrator, Civil Service (Medical): No    Lack of Transportation (Non-Medical): No  Physical Activity: Sufficiently Active (07/06/2021)   Exercise Vital Sign    Days of Exercise per Week: 6 days    Minutes of Exercise  per Session: 60 min  Stress: No Stress Concern Present (07/06/2021)   [employer] of Occupational Health - Occupational Stress Questionnaire    Feeling of Stress : Not at all  Social Connections: Socially Integrated (07/06/2021)   Social Connection and Isolation Panel [NHANES]    Frequency of Communication with Friends and Family: More than three times a week    Frequency of Social Gatherings with Friends and Family: More than three times a week    Attends Religious Services: More than  4 times per year    Active Member of Clubs or Organizations: Yes    Attends Banker Meetings: More than 4 times per year    Marital Status: Married  Catering manager Violence: Not At Risk (07/06/2021)   Humiliation, Afraid, Rape, and Kick questionnaire    Fear of Current or Ex-Partner: No    Emotionally Abused: No    Physically Abused: No    Sexually Abused: No    FAMILY HISTORY: Family History  Problem Relation Age of Onset   Breast cancer Mother 9   Colon cancer Mother 20   Cervical cancer Mother 22       s/p hysterectomy   Heart attack Father 76   Diabetes Father        probable pre DM   Skin cancer Father        maybe melanoma   Cancer Sister 4       appendiceal   Heart attack Maternal Grandfather 57   Heart attack Paternal Uncle        X2 ; >50y   Heart attack Maternal Uncle        d. 67   Parkinson's disease Maternal Grandmother        d. 41   Heart attack Paternal Grandfather    Breast cancer Other        unspecified age; maternal great aunt (MGM's sister)   Breast cancer Cousin        maternal 1st cousin, once-removed; unspecified age   Heart attack Paternal Uncle        >50y   Stroke Neg Hx     Review of Systems  Constitutional:  Negative for appetite change, chills, fatigue, fever and unexpected weight change.  HENT:   Negative for hearing loss, lump/mass and trouble swallowing.   Eyes:  Negative for eye problems and icterus.  Respiratory:  Negative for  chest tightness, cough and shortness of breath.   Cardiovascular:  Negative for chest pain, leg swelling and palpitations.  Gastrointestinal:  Negative for abdominal distention, abdominal pain, constipation, diarrhea, nausea and vomiting.  Endocrine: Negative for hot flashes.  Genitourinary:  Negative for difficulty urinating.   Musculoskeletal:  Negative for arthralgias.  Skin:  Negative for itching and rash.  Neurological:  Negative for dizziness, extremity weakness, headaches and numbness.  Hematological:  Negative for adenopathy. Does not bruise/bleed easily.  Psychiatric/Behavioral:  Negative for depression. The patient is not nervous/anxious.       PHYSICAL EXAMINATION   Onc Performance Status - 02/07/23 1001       ECOG Perf Status   ECOG Perf Status Restricted in physically strenuous activity but ambulatory and able to carry out work of a light or sedentary nature, e.[name]., light house work, office work      KPS SCALE   KPS % SCORE Able to carry on normal activity, minor s/s of disease             Vitals:   02/07/23 0957  BP: 118/61  Pulse: (!) 59  Resp: 18  Temp: (!) 97.4 F (36.3 C)  SpO2: 100%    Physical Exam Constitutional:      General: She is not in acute distress.    Appearance: Normal appearance. She is not toxic-appearing.  HENT:     Head: Normocephalic and atraumatic.     Mouth/Throat:     Mouth: Mucous membranes are moist.     Pharynx: Oropharynx is clear. No oropharyngeal exudate or posterior oropharyngeal  erythema.  Eyes:     General: No scleral icterus. Cardiovascular:     Rate and Rhythm: Normal rate and regular rhythm.     Pulses: Normal pulses.     Heart sounds: Normal heart sounds.  Pulmonary:     Effort: Pulmonary effort is normal.     Breath sounds: Normal breath sounds.  Chest:     Comments: Status post bilateral mastectomies with reconstruction, no signs of local recurrence noted. Abdominal:     General: Abdomen is flat. Bowel  sounds are normal. There is no distension.     Palpations: Abdomen is soft.     Tenderness: There is no abdominal tenderness.  Musculoskeletal:        General: No swelling.     Cervical back: Neck supple.  Lymphadenopathy:     Cervical: No cervical adenopathy.  Skin:    General: Skin is warm and dry.     Findings: No rash.  Neurological:     General: No focal deficit present.     Mental Status: She is alert.  Psychiatric:        Mood and Affect: Mood normal.        Behavior: Behavior normal.      ASSESSMENT and THERAPY PLAN:   History of bilateral breast cancer Courtney [name] is a 77 year old woman with history of bilateral breast cancer diagnosed in 2012 status post bilateral mastectomies followed by 5 years of antiestrogen therapy with letrozole that completed in October 2017.  Bilateral breast cancer: She has no clinical signs of breast cancer recurrence.  She will continue annual long-term follow-up at her request.  She does not require mammogram since she has undergone bilateral mastectomies. Health maintenance: I recommended that she continue regular follow-up with her primary care provider.  I recommended continued exercise and healthy diet.  RTC in 1 year for continued long-term survivorship.  All questions were answered. The patient knows to call the clinic with any problems, questions or concerns. We can certainly see the patient much sooner if necessary.  Total encounter time:20 minutes*in face-to-face visit time, chart review, lab review, care coordination, order entry, and documentation of the encounter time.    Lillard Anes, NP 02/07/23 10:36 AM Medical Oncology and Hematology [facility] [address] [place], [place] [address] Tel. [phone]    Fax. [phone]  *Total Encounter Time as defined by the Centers for Medicare and Medicaid Services includes, in addition to the face-to-face time of a patient visit (documented in the note above)  non-face-to-face time: obtaining and reviewing outside history, ordering and reviewing medications, tests or procedures, care coordination (communications with other health care professionals or caregivers) and documentation in the medical record.

## 2023-02-07 NOTE — Assessment & Plan Note (Signed)
Courtney [name] is a 77 year old woman with history of bilateral breast cancer diagnosed in 2012 status post bilateral mastectomies followed by 5 years of antiestrogen therapy with letrozole that completed in October 2017.  Bilateral breast cancer: She has no clinical signs of breast cancer recurrence.  She will continue annual long-term follow-up at her request.  She does not require mammogram since she has undergone bilateral mastectomies. Health maintenance: I recommended that she continue regular follow-up with her primary care provider.  I recommended continued exercise and healthy diet.  RTC in 1 year for continued long-term survivorship.

## 2023-02-23 ENCOUNTER — Other Ambulatory Visit: Payer: Self-pay | Admitting: Family Medicine

## 2023-02-23 DIAGNOSIS — I10 Essential (primary) hypertension: Secondary | ICD-10-CM

## 2023-03-09 ENCOUNTER — Ambulatory Visit: Payer: Medicare PPO | Admitting: Family Medicine

## 2023-03-21 ENCOUNTER — Encounter: Payer: Self-pay | Admitting: Cardiovascular Disease

## 2023-03-21 ENCOUNTER — Ambulatory Visit: Payer: Medicare PPO | Attending: Cardiovascular Disease | Admitting: Cardiovascular Disease

## 2023-03-21 VITALS — BP 98/52 | HR 68 | Ht <= 58 in | Wt 110.2 lb

## 2023-03-21 DIAGNOSIS — G4733 Obstructive sleep apnea (adult) (pediatric): Secondary | ICD-10-CM | POA: Diagnosis not present

## 2023-03-21 DIAGNOSIS — I10 Essential (primary) hypertension: Secondary | ICD-10-CM

## 2023-03-21 DIAGNOSIS — E782 Mixed hyperlipidemia: Secondary | ICD-10-CM | POA: Diagnosis not present

## 2023-03-21 DIAGNOSIS — R002 Palpitations: Secondary | ICD-10-CM | POA: Diagnosis not present

## 2023-03-21 NOTE — Assessment & Plan Note (Signed)
History of obstructive sleep apnea having seen Carolanne Grumbling in the past.  She does wear a dental orthotic for this.

## 2023-03-21 NOTE — Patient Instructions (Signed)
Medication Instructions:  Your physician recommends that you continue on your current medications as directed. Please refer to the Current Medication list given to you today.  *If you need a refill on your cardiac medications before your next appointment, please call your pharmacy*   Testing/Procedures: Dr. Berry has ordered a CT coronary calcium score.   Test locations:  [facility]  [facility]  This is $99 out of pocket.   Coronary CalciumScan A coronary calcium scan is an imaging test used to look for deposits of calcium and other fatty materials (plaques) in the inner lining of the blood vessels of the heart (coronary arteries). These deposits of calcium and plaques can partly clog and narrow the coronary arteries without producing any symptoms or warning signs. This puts a person at risk for a heart attack. This test can detect these deposits before symptoms develop. Tell a health care provider about: Any allergies you have. All medicines you are taking, including vitamins, herbs, eye drops, creams, and over-the-counter medicines. Any problems you or family members have had with anesthetic medicines. Any blood disorders you have. Any surgeries you have had. Any medical conditions you have. Whether you are pregnant or may be pregnant. What are the risks? Generally, this is a safe procedure. However, problems may occur, including: Harm to a pregnant woman and her unborn baby. This test involves the use of radiation. Radiation exposure can be dangerous to a pregnant woman and her unborn baby. If you are pregnant, you generally should not have this procedure done. Slight increase in the risk of cancer. This is because of the radiation involved in the test. What happens before the procedure? No preparation is needed for this procedure. What happens during the procedure? You will undress and  remove any jewelry around your neck or chest. You will put on a hospital gown. Sticky electrodes will be placed on your chest. The electrodes will be connected to an electrocardiogram (ECG) machine to record a tracing of the electrical activity of your heart. A CT scanner will take pictures of your heart. During this time, you will be asked to lie still and hold your breath for 2-3 seconds while a picture of your heart is being taken. The procedure may vary among health care providers and hospitals. What happens after the procedure? You can get dressed. You can return to your normal activities. It is up to you to get the results of your test. Ask your health care provider, or the department that is doing the test, when your results will be ready. Summary A coronary calcium scan is an imaging test used to look for deposits of calcium and other fatty materials (plaques) in the inner lining of the blood vessels of the heart (coronary arteries). Generally, this is a safe procedure. Tell your health care provider if you are pregnant or may be pregnant. No preparation is needed for this procedure. A CT scanner will take pictures of your heart. You can return to your normal activities after the scan is done. This information is not intended to replace advice given to you by your health care provider. Make sure you discuss any questions you have with your health care provider. Document Released: 10/16/2007 Document Revised: 03/08/2016 Document Reviewed: 03/08/2016 Elsevier Interactive Patient Education  2017 Elsevier Inc.    Follow-Up: At [facility], you and your health needs are our priority.  As part of our continuing   mission to provide you with exceptional heart care, we have created designated Provider Care Teams.  These Care Teams include your primary Cardiologist (physician) and Advanced Practice Providers (APPs -  Physician Assistants and Nurse Practitioners) who all work together to  provide you with the care you need, when you need it.  We recommend signing up for the patient portal called "MyChart".  Sign up information is provided on this After Visit Summary.  MyChart is used to connect with patients for Virtual Visits (Telemedicine).  Patients are able to view lab/test results, encounter notes, upcoming appointments, etc.  Non-urgent messages can be sent to your provider as well.   To learn more about what you can do with MyChart, go to [email or web address].    Your next appointment:   We will see you on an as needed basis.  Provider:   Jonathan Berry, MD  

## 2023-03-21 NOTE — Assessment & Plan Note (Signed)
History of hyperlipidemia on low-dose statin therapy with lipid profile performed 01/12/2023 revealing total cholesterol 164, LDL 66 and HDL of 85.

## 2023-03-21 NOTE — Assessment & Plan Note (Signed)
History of essential hypertension her blood pressure measured today at 98/52.  She is on amlodipine, hydrochlorothiazide and losartan.

## 2023-03-21 NOTE — Progress Notes (Signed)
03/21/2023 Courtney [name]   02-25-1946  [id number]  Primary Physician [name], [name], MD Primary Cardiologist: Runell Gess MD Nicholes Calamity, [place]  HPI:  Courtney [name] is a 77 y.o. thin-appearing married Caucasian female mother of 2 sons, grandmother of 1 grandchild who is retired from being a Runner, broadcasting/film/video at [employer].  She is referred by her PCP, Dr. Betty [name], for dyspnea on exertion and palpitations.  Her clinically factor profile is notable for treated hypertension and hyperlipidemia.  There is no family history for heart disease.  She is never had a heart attack or stroke.  She does have sleep apnea on a dental orthotic.  She has reactive airways disease as well.  She gets occasional left-sided atypical chest pain.  She has had COVID in the past.   Current Meds  Medication Sig   albuterol (VENTOLIN HFA) 108 (90 Base) MCG/ACT inhaler Inhale 2 puffs into the lungs every 6 (six) hours as needed for wheezing or shortness of breath.   amLODipine (NORVASC) 5 MG tablet TAKE ONE TABLET DAILY   aspirin 81 MG tablet Take 81 mg by mouth daily.   budesonide-formoterol (SYMBICORT) 160-4.5 MCG/ACT inhaler INHALE 1 TO 2 PUFFS EVERY 12 HOURS AS NEEDED. GARGLE AND SPIT AFTER USE   calcium citrate-vitamin D (CITRACAL+D) 315-200 MG-UNIT per tablet Take 1 tablet by mouth daily.   Carboxymethylcellulose Sod PF (RETAINE CMC) 0.5 % SOLN Apply to eye.   Cholecalciferol (VITAMIN D3) 1000 UNITS CAPS Take by mouth daily.   co-enzyme Q-10 30 MG capsule Take 50 mg by mouth daily.   FINACEA 15 % cream Apply 1 application topically 2 (two) times daily. For rosacea   hydrochlorothiazide (MICROZIDE) 12.5 MG capsule TAKE ONE CAPSULE DAILY   losartan (COZAAR) 100 MG tablet TAKE ONE TABLET DAILY   Misc Natural Products (TART CHERRY ADVANCED PO) Take 2,400 mg by mouth daily.   Multiple Vitamins-Minerals (MULTIVITAMIN WITH MINERALS) tablet Take 1 tablet by mouth daily.   OMEGA 3 1000 MG CAPS Take  1,000 mg by mouth daily.   pantoprazole (PROTONIX) 40 MG tablet TAKE ONE TABLET DAILY   psyllium (METAMUCIL) 58.6 % powder Take 1 packet by mouth daily.   rosuvastatin (CRESTOR) 5 MG tablet TAKE ONE TABLET BY MOUTH EVERY DAY     No Known Allergies  Social History   Socioeconomic History   Marital status: Married    Spouse name: Not on file   Number of children: Not on file   Years of education: Not on file   Highest education level: Bachelor's degree (e.g., BA, AB, BS)  Occupational History   Not on file  Tobacco Use   Smoking status: Former    Current packs/day: 0.00    Types: Cigarettes    Start date: 05/03/1960    Quit date: 05/03/1965    Years since quitting: 57.9   Smokeless tobacco: Never   Tobacco comments:    smoked 1962-1967, less than 1 ppd  Vaping Use   Vaping status: Never Used  Substance and Sexual Activity   Alcohol use: Yes    Alcohol/week: 14.0 standard drinks of alcohol    Types: 14 Glasses of wine per week    Comment: 2-3 /day   Drug use: No   Sexual activity: Yes  Other Topics Concern   Not on file  Social History Narrative   Married   2-3 glasses wine/day   remote small vol smmoker   Social Determinants of Health  Financial Resource Strain: Low Risk  (07/06/2021)   Overall Financial Resource Strain (CARDIA)    Difficulty of Paying Living Expenses: Not hard at all  Food Insecurity: No Food Insecurity (07/06/2021)   Hunger Vital Sign    Worried About Running Out of Food in the Last Year: Never true    Ran Out of Food in the Last Year: Never true  Transportation Needs: No Transportation Needs (07/06/2021)   PRAPARE - Administrator, Civil Service (Medical): No    Lack of Transportation (Non-Medical): No  Physical Activity: Sufficiently Active (07/06/2021)   Exercise Vital Sign    Days of Exercise per Week: 6 days    Minutes of Exercise per Session: 60 min  Stress: No Stress Concern Present (07/06/2021)   [employer] of Occupational  Health - Occupational Stress Questionnaire    Feeling of Stress : Not at all  Social Connections: Socially Integrated (07/06/2021)   Social Connection and Isolation Panel [NHANES]    Frequency of Communication with Friends and Family: More than three times a week    Frequency of Social Gatherings with Friends and Family: More than three times a week    Attends Religious Services: More than 4 times per year    Active Member of [employer] or Organizations: Yes    Attends Engineer, structural: More than 4 times per year    Marital Status: Married  Catering manager Violence: Not At Risk (07/06/2021)   Humiliation, Afraid, Rape, and Kick questionnaire    Fear of Current or Ex-Partner: No    Emotionally Abused: No    Physically Abused: No    Sexually Abused: No     Review of Systems: General: negative for chills, fever, night sweats or weight changes.  Cardiovascular: negative for chest pain, dyspnea on exertion, edema, orthopnea, palpitations, paroxysmal nocturnal dyspnea or shortness of breath Dermatological: negative for rash Respiratory: negative for cough or wheezing Urologic: negative for hematuria Abdominal: negative for nausea, vomiting, diarrhea, bright red blood per rectum, melena, or hematemesis Neurologic: negative for visual changes, syncope, or dizziness All other systems reviewed and are otherwise negative except as noted above.    Blood pressure (!) 98/52, pulse 68, height 4\' 10"  (1.473 m), weight 110 lb 3.2 oz (50 kg), SpO2 98%.  General appearance: alert and no distress Neck: no adenopathy, no carotid bruit, no JVD, supple, symmetrical, trachea midline, and thyroid not enlarged, symmetric, no tenderness/mass/nodules Lungs: clear to auscultation bilaterally Heart: regular rate and rhythm, S1, S2 normal, no murmur, click, rub or gallop Extremities: extremities normal, atraumatic, no cyanosis or edema Pulses: 2+ and symmetric Skin: Skin color, texture, turgor normal. No  rashes or lesions Neurologic: Grossly normal  EKG EKG Interpretation Date/Time:  Monday March 21 2023 13:59:15 EST Ventricular Rate:  68 PR Interval:  138 QRS Duration:  68 QT Interval:  406 QTC Calculation: 431 R Axis:   -37  Text Interpretation: Normal sinus rhythm Possible Left atrial enlargement Left axis deviation When compared with ECG of 11-Jan-2011 13:01, Minimal criteria for Anterior infarct are no longer Present Confirmed by Nanetta Batty [phone]) on 03/21/2023 2:24:05 PM    ASSESSMENT AND PLAN:   HTN (hypertension) History of essential hypertension her blood pressure measured today at 98/52.  She is on amlodipine, hydrochlorothiazide and losartan.  OSA (obstructive sleep apnea) History of obstructive sleep apnea having seen Carolanne Grumbling in the past.  She does wear a dental orthotic for this.  Hyperlipidemia History of hyperlipidemia on low-dose  statin therapy with lipid profile performed 01/12/2023 revealing total cholesterol 164, LDL 66 and HDL of 85.     Runell Gess MD FACP,FACC,FAHA, [facility] 03/21/2023 2:33 PM

## 2023-03-23 ENCOUNTER — Ambulatory Visit: Payer: Medicare PPO | Admitting: Family Medicine

## 2023-03-31 ENCOUNTER — Other Ambulatory Visit: Payer: Self-pay | Admitting: Family Medicine

## 2023-04-15 ENCOUNTER — Ambulatory Visit (INDEPENDENT_AMBULATORY_CARE_PROVIDER_SITE_OTHER): Payer: Medicare PPO

## 2023-04-15 ENCOUNTER — Telehealth: Payer: Self-pay

## 2023-04-15 ENCOUNTER — Telehealth: Payer: Medicare PPO | Admitting: Adult Health

## 2023-04-15 VITALS — Temp 99.9°F | Ht <= 58 in | Wt 108.0 lb

## 2023-04-15 DIAGNOSIS — R059 Cough, unspecified: Secondary | ICD-10-CM

## 2023-04-15 DIAGNOSIS — R6883 Chills (without fever): Secondary | ICD-10-CM | POA: Diagnosis not present

## 2023-04-15 DIAGNOSIS — U071 COVID-19: Secondary | ICD-10-CM | POA: Diagnosis not present

## 2023-04-15 LAB — POCT INFLUENZA A/B
Influenza A, POC: NEGATIVE
Influenza B, POC: NEGATIVE

## 2023-04-15 LAB — POC COVID19 BINAXNOW: SARS Coronavirus 2 Ag: POSITIVE — AB

## 2023-04-15 MED ORDER — NIRMATRELVIR/RITONAVIR (PAXLOVID)TABLET: 3.0000 | ORAL_TABLET | Freq: Two times a day (BID) | ORAL | 0 refills | Status: AC

## 2023-04-15 NOTE — Progress Notes (Signed)
Virtual Visit via Video Note  I connected with Courtney [name]  on 04/15/23 at 11:30 AM EST by a video enabled telemedicine application and verified that I am speaking with the correct person using two identifiers.  Location patient: home Location provider:work or home office Persons participating in the virtual visit: patient, provider  I discussed the limitations of evaluation and management by telemedicine and the availability of in person appointments. The patient expressed understanding and agreed to proceed.   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  She is being evaluated today for an acute issue. She reports that starting yesterday she developed nasal drainage, productive cough, chest tightness, headache, low-grade fever, and fatigue.  She has not had any shortness of breath or wheezing.  He was tested for COVID-19 prior to her appointment today and her test showed that she was positive.   ROS: See pertinent positives and negatives per HPI.  Past Medical History:  Diagnosis Date   Asthma    Breast cancer (HCC) 11/2010   post double mastectomy; Dr Welton Flakes   Diverticulosis    Family history of breast cancer    Fundic gland polyps of stomach, benign 07/2022   GERD (gastroesophageal reflux disease)    S/P dilation X 1   Hemorrhoids    Hypertension    Night sweats    OSA (obstructive sleep apnea) 08/03/2017   Very mild with AHI 7.2/hr.     Prolapsed internal hemorrhoids, grade 3 06/27/2017   Grade 2 on anoscopy exam but grade 3 by history and seen a colonoscopy as well All 3 columns banded 06/27/2017  09/21/2017 banded RP and RA      Reactive airway disease     Past Surgical History:  Procedure Laterality Date   ABDOMINAL HYSTERECTOMY     bilateral breast reconstruction  Jan 2013   BREAST SURGERY  01/13/11   bilateral total mastectomy; Dr Derrell Lolling   COLONOSCOPY  2014   Dr Juanda Chance; hyperplastic polyp   ESOPHAGEAL DILATION  2014   HEMORRHOID BANDING  2019   February and May   NASAL SEPTUM SURGERY     RECONSTRUCTION / CORRECTION OF NIPPLE / Rafael Bihari  June 2013   THYROGLOSSAL DUCT CYST      Family History  Problem Relation Age of Onset   Breast cancer Mother 76   Colon cancer Mother 28   Cervical cancer Mother 73       s/p hysterectomy   Heart attack Father 68   Diabetes Father        probable pre DM   Skin cancer Father        maybe melanoma   Cancer Sister 30       appendiceal   Heart attack Maternal Grandfather 57   Heart attack Paternal Uncle        X2 ; >50y   Heart attack Maternal Uncle        d. 24   Parkinson's disease Maternal Grandmother        d. 41   Heart attack Paternal Grandfather    Breast cancer Other        unspecified age; maternal great aunt (MGM's sister)   Breast cancer Cousin  maternal 1st cousin, once-removed; unspecified age   Heart attack Paternal Uncle        >50y   Stroke Neg Hx        Current Outpatient Medications:    albuterol (VENTOLIN HFA) 108 (90 Base) MCG/ACT inhaler, Inhale 2 puffs into the lungs every 6 (six) hours as needed for wheezing or shortness of breath., Disp: 8 g, Rfl: 3   amLODipine (NORVASC) 5 MG tablet, TAKE ONE TABLET DAILY, Disp: 90 tablet, Rfl: 2   aspirin 81 MG tablet, Take 81 mg by mouth daily., Disp: , Rfl:    budesonide-formoterol (SYMBICORT) 160-4.5 MCG/ACT inhaler, INHALE 1 TO 2 PUFFS EVERY 12 HOURS AS NEEDED. GARGLE AND SPIT AFTER USE, Disp: 10.2 g, Rfl: 11   calcium citrate-vitamin D (CITRACAL+D) 315-200 MG-UNIT per tablet, Take 1 tablet by mouth daily., Disp: , Rfl:    Carboxymethylcellulose Sod PF (RETAINE CMC) 0.5 % SOLN,  Apply to eye., Disp: , Rfl:    Cholecalciferol (VITAMIN D3) 1000 UNITS CAPS, Take by mouth daily., Disp: , Rfl:    co-enzyme Q-10 30 MG capsule, Take 50 mg by mouth daily., Disp: , Rfl:    FINACEA 15 % cream, Apply 1 application topically 2 (two) times daily. For rosacea, Disp: , Rfl: 0   hydrochlorothiazide (MICROZIDE) 12.5 MG capsule, TAKE ONE CAPSULE DAILY, Disp: 90 capsule, Rfl: 2   losartan (COZAAR) 100 MG tablet, TAKE ONE TABLET DAILY, Disp: 90 tablet, Rfl: 3   Misc Natural Products (TART CHERRY ADVANCED PO), Take 2,400 mg by mouth daily., Disp: , Rfl:    montelukast (SINGULAIR) 10 MG tablet, TAKE 1 TABLET(10 MG) BY MOUTH AT BEDTIME, Disp: 90 tablet, Rfl: 3   Multiple Vitamins-Minerals (MULTIVITAMIN WITH MINERALS) tablet, Take 1 tablet by mouth daily., Disp: , Rfl:    OMEGA 3 1000 MG CAPS, Take 1,000 mg by mouth daily., Disp: , Rfl:    pantoprazole (PROTONIX) 40 MG tablet, TAKE ONE TABLET DAILY, Disp: 90 tablet, Rfl: 2   psyllium (METAMUCIL) 58.6 % powder, Take 1 packet by mouth daily., Disp: , Rfl:    rosuvastatin (CRESTOR) 5 MG tablet, TAKE ONE TABLET BY MOUTH EVERY DAY, Disp: 90 tablet, Rfl: 3  EXAM:  VITALS per patient if applicable:  GENERAL: alert, oriented, appears well and in no acute distress  HEENT: atraumatic, conjunttiva clear, no obvious abnormalities on inspection of external nose and ears  NECK: normal movements of the head and neck  LUNGS: on inspection no signs of respiratory distress, breathing rate appears normal, no obvious gross SOB, gasping or wheezing  CV: no obvious cyanosis  MS: moves all visible extremities without noticeable abnormality  PSYCH/NEURO: pleasant and cooperative, no obvious depression or anxiety, speech and thought processing grossly intact  ASSESSMENT AND PLAN:  Discussed the following assessment and plan:  1. COVID-19 (Primary) -Will treat with Paxlovid.  She has been on this medication in the past and had no side effects.  Will  have her stop her statin while she is taking it.  Encouraged to quarantine away from her husband, rest and stay hydrated.  If no improvement in the next 3 to 4 days then follow-up with PCP or myself - nirmatrelvir/ritonavir (PAXLOVID) 20 x 150 MG & 10 x 100MG  TABS; Take 3 tablets by mouth 2 (two) times daily for 5 days. (Take nirmatrelvir 150 mg two tablets twice daily for 5 days and ritonavir 100 mg one tablet twice daily for 5 days) Patient GFR is 72  Dispense: 30 tablet;  Refill: 0  2. Cough, unspecified type  - POC COVID-19- Positive - POC Influenza A/B- Negative   3. Chills  - POC COVID-19 - POC Influenza A/B     I discussed the assessment and treatment plan with the patient. The patient was provided an opportunity to ask questions and all were answered. The patient agreed with the plan and demonstrated an understanding of the instructions.   The patient was advised to call back or seek an in-person evaluation if the symptoms worsen or if the condition fails to improve as anticipated.   Shirline Frees, NP

## 2023-04-15 NOTE — Telephone Encounter (Signed)
Disregard

## 2023-04-20 LAB — POC COVID19 BINAXNOW: SARS Coronavirus 2 Ag: POSITIVE — AB

## 2023-04-20 LAB — POCT INFLUENZA A/B
Influenza A, POC: NEGATIVE
Influenza B, POC: NEGATIVE

## 2023-05-02 ENCOUNTER — Other Ambulatory Visit: Payer: Self-pay | Admitting: Family Medicine

## 2023-05-02 DIAGNOSIS — K219 Gastro-esophageal reflux disease without esophagitis: Secondary | ICD-10-CM

## 2023-05-06 ENCOUNTER — Other Ambulatory Visit: Payer: Self-pay | Admitting: Family Medicine

## 2023-05-06 DIAGNOSIS — R051 Acute cough: Secondary | ICD-10-CM

## 2023-05-10 ENCOUNTER — Other Ambulatory Visit (HOSPITAL_COMMUNITY)
Admission: RE | Admit: 2023-05-10 | Discharge: 2023-05-10 | Disposition: A | Discharge status: Home / Self Care | Payer: Self-pay | Source: Ambulatory Visit | Attending: Oncology | Admitting: Oncology

## 2023-05-10 DIAGNOSIS — Z006 Encounter for examination for normal comparison and control in clinical research program: Secondary | ICD-10-CM | POA: Insufficient documentation

## 2023-05-16 ENCOUNTER — Ambulatory Visit (HOSPITAL_BASED_OUTPATIENT_CLINIC_OR_DEPARTMENT_OTHER)
Admission: RE | Admit: 2023-05-16 | Discharge: 2023-05-16 | Disposition: A | Discharge status: Home / Self Care | Payer: Self-pay | Source: Ambulatory Visit | Attending: Cardiovascular Disease | Admitting: Cardiovascular Disease

## 2023-05-16 DIAGNOSIS — G4733 Obstructive sleep apnea (adult) (pediatric): Secondary | ICD-10-CM

## 2023-05-16 DIAGNOSIS — I10 Essential (primary) hypertension: Secondary | ICD-10-CM

## 2023-05-16 DIAGNOSIS — R002 Palpitations: Secondary | ICD-10-CM

## 2023-05-16 DIAGNOSIS — E782 Mixed hyperlipidemia: Secondary | ICD-10-CM

## 2023-05-20 LAB — GENECONNECT MOLECULAR SCREEN: Genetic Analysis Overall Interpretation: NEGATIVE

## 2023-05-25 ENCOUNTER — Other Ambulatory Visit: Payer: Self-pay | Admitting: Family Medicine

## 2023-05-25 DIAGNOSIS — I10 Essential (primary) hypertension: Secondary | ICD-10-CM

## 2023-07-05 ENCOUNTER — Other Ambulatory Visit: Payer: Self-pay | Admitting: Family Medicine

## 2023-07-05 DIAGNOSIS — K219 Gastro-esophageal reflux disease without esophagitis: Secondary | ICD-10-CM

## 2023-07-13 DIAGNOSIS — L814 Other melanin hyperpigmentation: Secondary | ICD-10-CM | POA: Diagnosis not present

## 2023-07-13 DIAGNOSIS — L570 Actinic keratosis: Secondary | ICD-10-CM | POA: Diagnosis not present

## 2023-07-13 DIAGNOSIS — D225 Melanocytic nevi of trunk: Secondary | ICD-10-CM | POA: Diagnosis not present

## 2023-07-13 DIAGNOSIS — I788 Other diseases of capillaries: Secondary | ICD-10-CM | POA: Diagnosis not present

## 2023-07-13 DIAGNOSIS — L2089 Other atopic dermatitis: Secondary | ICD-10-CM | POA: Diagnosis not present

## 2023-07-13 DIAGNOSIS — Z85828 Personal history of other malignant neoplasm of skin: Secondary | ICD-10-CM | POA: Diagnosis not present

## 2023-07-13 DIAGNOSIS — L821 Other seborrheic keratosis: Secondary | ICD-10-CM | POA: Diagnosis not present

## 2023-08-08 DIAGNOSIS — Z01419 Encounter for gynecological examination (general) (routine) without abnormal findings: Secondary | ICD-10-CM | POA: Diagnosis not present

## 2023-08-25 ENCOUNTER — Other Ambulatory Visit: Payer: Self-pay | Admitting: Family Medicine

## 2023-10-03 DIAGNOSIS — L245 Irritant contact dermatitis due to other chemical products: Secondary | ICD-10-CM | POA: Diagnosis not present

## 2023-10-03 DIAGNOSIS — Z85828 Personal history of other malignant neoplasm of skin: Secondary | ICD-10-CM | POA: Diagnosis not present

## 2023-10-03 DIAGNOSIS — D1801 Hemangioma of skin and subcutaneous tissue: Secondary | ICD-10-CM | POA: Diagnosis not present

## 2023-10-03 DIAGNOSIS — L218 Other seborrheic dermatitis: Secondary | ICD-10-CM | POA: Diagnosis not present

## 2023-10-18 ENCOUNTER — Encounter: Payer: Self-pay | Admitting: Family Medicine

## 2023-10-18 ENCOUNTER — Ambulatory Visit (INDEPENDENT_AMBULATORY_CARE_PROVIDER_SITE_OTHER): Admitting: Family Medicine

## 2023-10-18 DIAGNOSIS — Z0000 Encounter for general adult medical examination without abnormal findings: Secondary | ICD-10-CM | POA: Diagnosis not present

## 2023-10-18 NOTE — Patient Instructions (Addendum)
 I really enjoyed getting to talk with you today! I am available on Tuesdays and Thursdays for virtual visits if you have any questions or concerns, or if I can be of any further assistance.   CHECKLIST FROM ANNUAL WELLNESS VISIT:  -Follow up (please call to schedule if not scheduled after visit):   -yearly for annual wellness visit with primary care office  Here is a list of your preventive care/health maintenance measures and the plan for each if any are due:  PLAN For any measures below that may be due:   Can get vaccines at the pharmacy. When you do, please bring copy of receipt so that we can update your record. Thanks!  2. If you would like to do a bone density test at any point, please let us  know.  3. Please get your mammogram yearly.  Health Maintenance  Topic Date Due   COVID-19 Vaccine (4 - 2024-25 season) 11/03/2023 (Originally 01/02/2023)   DTaP/Tdap/Td (2 - Td or Tdap) 12/29/2023 (Originally 02/17/2022)   INFLUENZA VACCINE  12/02/2023   Medicare Annual Wellness (AWV)  10/17/2024   Pneumococcal Vaccine: 50+ Years  Completed   DEXA SCAN  Completed   Hepatitis C Screening  Completed   Zoster Vaccines- Shingrix  Completed   HPV VACCINES  Aged Out   Meningococcal B Vaccine  Aged Out   Colonoscopy  Discontinued    -See a dentist at least yearly  -Get your eyes checked and then per your eye specialist's recommendations  -Other issues addressed today:   1. Alcohol: please limit alcohol intake to no more than once alcoholic beverage (5 oz of wine) per any given 24 hour period.   -I have included below further information regarding a healthy whole foods based diet, physical activity guidelines for adults, stress management and opportunities for social connections. I hope you find this information useful.    -----------------------------------------------------------------------------------------------------------------------------------------------------------------------------------------------------------------------------------------------------------    NUTRITION: -eat real food: lots of colorful vegetables (half the plate) and fruits -5-7 servings of vegetables and fruits per day (fresh or steamed is best), exp. 2 servings of vegetables with lunch and dinner and 2 servings of fruit per day. Berries and greens such as kale and collards are great choices.  -consume on a regular basis:  fresh fruits, fresh veggies, fish, nuts, seeds, healthy oils (such as olive oil, avocado oil), whole grains (make sure for bread/pasta/crackers/etc., that the first ingredient on label contains the word whole), legumes. -can eat small amounts of dairy and lean meat (no larger than the palm of your hand), but avoid processed meats such as ham, bacon, lunch meat, etc. -drink water -try to avoid fast food and pre-packaged foods, processed meat, ultra processed foods/beverages (donuts, candy, etc.) -most experts advise limiting sodium to < 2300mg  per day, should limit further is any chronic conditions such as high blood pressure, heart disease, diabetes, etc. The American Heart Association advised that < 1500mg  is is ideal -try to avoid foods/beverages that contain any ingredients with names you do not recognize  -try to avoid foods/beverages  with added sugar or sweeteners/sweets  -try to avoid sweet drinks (including diet drinks): soda, juice, Gatorade, sweet tea, power drinks, diet drinks -try to avoid white rice, white bread, pasta (unless whole grain)  EXERCISE GUIDELINES FOR ADULTS: -if you wish to increase your physical activity, do so gradually and with the approval of your doctor -STOP and seek medical care immediately if you have any chest pain, chest discomfort or trouble breathing when starting or  increasing exercise  -move and stretch your body, legs, feet and arms when sitting for long periods -Physical activity guidelines for optimal health in adults: -get at least 150 minutes per week of moderate exercise (can talk, but not sing); this is about 20-30 minutes of sustained activity 5-7 days per week or two 10-15 minute episodes of sustained activity 5-7 days per week -do some muscle building/resistance training/strength training at least 2 days per week  -balance exercises 3+ days per week:   Stand somewhere where you have something sturdy to hold onto if you lose balance    1) lift up on toes, then back down, start with 5x per day and work up to 20x   2) stand and lift one leg straight out to the side so that foot is a few inches of the floor, start with 5x each side and work up to 20x each side   3) stand on one foot, start with 5 seconds each side and work up to 20 seconds on each side  If you need ideas or help with getting more active:  -Silver sneakers https://tools.silversneakers.com  -Walk with a Doc: http://www.duncan-williams.com/  -try to include resistance (weight lifting/strength building) and balance exercises twice per week: or the following link for ideas: http://castillo-powell.com/  BuyDucts.dk  STRESS MANAGEMENT: -can try meditating, or just sitting quietly with deep breathing while intentionally relaxing all parts of your body for 5 minutes daily -if you need further help with stress, anxiety or depression please follow up with your primary doctor or contact the wonderful folks at [facility]: [phone]  SOCIAL CONNECTIONS: -options in [place] if you wish to engage in more social and exercise related activities:  -Silver sneakers https://tools.silversneakers.com  -Walk with a Doc: http://www.duncan-williams.com/  -Check out the [facility] Active Adults 50+  section on the [place] of [employer] (hiking clubs, book clubs, cards and games, chess, exercise classes, aquatic classes and much more) - see the website for details: https://www.[place].gov/departments/parks-recreation/active-adults50  -YouTube has lots of exercise videos for different ages and abilities as well  -[facility] (a variety of indoor and outdoor inperson activities for adults). [phone]. [address].  -Virtual Online Classes (a variety of topics): see seniorplanet.org or call [phone]  -consider volunteering at a school, hospice center, church, senior center or elsewhere

## 2023-10-18 NOTE — Progress Notes (Signed)
 PATIENT CHECK-IN and HEALTH RISK ASSESSMENT QUESTIONNAIRE:  -completed by phone/video for upcoming Medicare Preventive Visit e-Visit Check-in: 1)Vitals (height, wt, BP, etc) - record in vitals section for visit on day of visit Request home vitals (wt, BP, etc.) and enter into vitals, THEN update Vital Signs SmartPhrase below at the top of the HPI. See below.  2)Review and Update Medications, Allergies PMH, Surgeries, Social history in Epic 3)Hospitalizations in the last year with date/reason? NO   4)Review and Update Care Team (patient's specialists) in Epic 5) Complete PHQ9 in Epic  6) Complete Fall Screening in Epic 7)Review all Health Maintenance Due and order under PCP if not done.  Medicare Wellness Patient Questionnaire:  Answer theses question about your habits: How often do you have a drink containing alcohol?Yes, every day  How many drinks containing alcohol do you have on a typical day when you are drinking?2 glasses of wine  How often do you have six or more drinks on one occasion?never  Have you ever smoked?yes  Quit date if applicable? 1967   How many packs a day do/did you smoke? Less than one  Do you use smokeless tobacco?NO  Do you use an illicit drugs?No  On average, how many days per week do you engage in moderate to strenuous exercise (like a brisk walk)?4-5 days days a week, water aerobics twice a week and walks 3 days per week usually 45-60 minutes, also does balance exercises I provided her last year On average, how many minutes do you engage in exercise at this level?60 minutes  Are you sexually active? Yes Number of partners?1 Typical breakfast:leftover from dinner  Typical lunch:None  Typical dinner:Fish or seafood, vegetable or salad. Rice or potatoes  Typical snacks: Fruit, nuts   Beverages: Iced coffee, and water   Answer theses question about your everyday activities: Can you perform most household chores?Yes  Are you deaf or have significant trouble  hearing?No Do you feel that you have a problem with memory?NO  Do you feel safe at home?Yes  Last dentist visit?a few months ago, her husband is a Education officer, community  8. Do you have any difficulty performing your everyday activities?NO  Are you having any difficulty walking, taking medications on your own, and or difficulty managing daily home needs?NO  Do you have difficulty walking or climbing stairs?NO  Do you have difficulty dressing or bathing?NO  Do you have difficulty doing errands alone such as visiting a doctor's office or shopping?NO  Do you currently have any difficulty preparing food and eating?NO  Do you currently have any difficulty using the toilet?NO  Do you have any difficulty managing your finances?No  Do you have any difficulties with housekeeping of managing your housekeeping?NO    Do you have Advanced Directives in place (Living Will, Healthcare Power or Attorney)? Yes    Last eye Exam and location?Oct 2024, [facility]    Do you currently use prescribed or non-prescribed narcotic or opioid pain medications?NO   Do you have a history or close family history of breast, ovarian, tubal or peritoneal cancer or a family member with BRCA (breast cancer susceptibility 1 and 2) gene mutations?Yes, the patient and Mother Had Breast Cancer    Nurse/Assistant Credentials/time stamp:Leah A.Wright CMA  10:50am     ----------------------------------------------------------------------------------------------------------------------------------------------------------------------------------------------------------------------  Because this visit was a virtual/telehealth visit, some criteria may be missing or patient reported. Any vitals not documented were not able to be obtained and vitals that have been documented are patient reported.  MEDICARE ANNUAL PREVENTIVE VISIT WITH PROVIDER: (Welcome to Medicare, initial annual wellness or annual wellness exam)  Virtual Visit via Video  Note  I connected with Courtney [name] on 10/18/23 by a video enabled telemedicine application and verified that I am speaking with the correct person using two identifiers.  Location patient: home Location provider:work or home office Persons participating in the virtual visit: patient, provider  Concerns and/or follow up today: denies any concerns, reports is doing well overall. Occ some acid reflux and hemorrhoids and plans to see GI.   See HM section in Epic for other details of completed HM.    ROS: negative for report of fevers, unintentional weight loss, vision changes, vision loss, hearing loss or change, chest pain, sob, hemoptysis, melena, hematochezia, hematuria, falls, bleeding or bruising, thoughts of suicide or self harm, memory loss  Patient-completed extensive health risk assessment - reviewed and discussed with the patient: See Health Risk Assessment completed with patient prior to the visit either above or in recent phone note. This was reviewed in detailed with the patient today and appropriate recommendations, orders and referrals were placed as needed per Summary below and patient instructions.   Review of Medical History: -PMH, PSH, Family History and current specialty and care providers reviewed and updated and listed below   Patient Care Team: [name], [name], MD as PCP - General (Family Medicine) Debbie Fails, Laura Polio, NP as Nurse Practitioner (Hematology and Oncology) Avis Boehringer, MD as Consulting Physician (Dermatology)   Past Medical History:  Diagnosis Date   Asthma    Breast cancer [facility]) 11/2010   post double mastectomy; Dr Meredeth Stallion   Diverticulosis    Family history of breast cancer    Fundic gland polyps of stomach, benign 07/2022   GERD (gastroesophageal reflux disease)    S/P dilation X 1   Hemorrhoids    Hypertension    Night sweats    OSA (obstructive sleep apnea) 08/03/2017   Very mild with AHI 7.2/hr.     Prolapsed internal hemorrhoids,  grade 3 06/27/2017   Grade 2 on anoscopy exam but grade 3 by history and seen a colonoscopy as well All 3 columns banded 06/27/2017  09/21/2017 banded RP and RA     Reactive airway disease     Past Surgical History:  Procedure Laterality Date   ABDOMINAL HYSTERECTOMY     bilateral breast reconstruction  Jan 2013   BREAST SURGERY  01/13/11   bilateral total mastectomy; Dr Lauralee Poll   COLONOSCOPY  2014   Dr Grandville Lax; hyperplastic polyp   ESOPHAGEAL DILATION  2014   HEMORRHOID BANDING  2019   February and May   NASAL SEPTUM SURGERY     RECONSTRUCTION / CORRECTION OF NIPPLE / Shana Daring  June 2013   THYROGLOSSAL DUCT CYST      Social History   Socioeconomic History   Marital status: Married    Spouse name: Not on file   Number of children: Not on file   Years of education: Not on file   Highest education level: Bachelor's degree (e.g., BA, AB, BS)  Occupational History   Not on file  Tobacco Use   Smoking status: Former    Current packs/day: 0.00    Types: Cigarettes    Start date: 05/03/1960    Quit date: 05/03/1965    Years since quitting: 58.4   Smokeless tobacco: Never   Tobacco comments:    smoked 1962-1967, less than 1 ppd  Vaping Use   Vaping status:  Never Used  Substance and Sexual Activity   Alcohol use: Yes    Alcohol/week: 14.0 standard drinks of alcohol    Types: 14 Glasses of wine per week    Comment: 2-3 /day   Drug use: No   Sexual activity: Yes  Other Topics Concern   Not on file  Social History Narrative   Married   2-3 glasses wine/day   remote small vol smmoker   Social Drivers of Health   Financial Resource Strain: Low Risk  (10/17/2023)   Overall Financial Resource Strain (CARDIA)    Difficulty of Paying Living Expenses: Not hard at all  Food Insecurity: No Food Insecurity (10/17/2023)   Hunger Vital Sign    Worried About Running Out of Food in the Last Year: Never true    Ran Out of Food in the Last Year: Never true  Transportation Needs: No  Transportation Needs (10/17/2023)   PRAPARE - Administrator, Civil Service (Medical): No    Lack of Transportation (Non-Medical): No  Physical Activity: Sufficiently Active (10/17/2023)   Exercise Vital Sign    Days of Exercise per Week: 5 days    Minutes of Exercise per Session: 50 min  Stress: No Stress Concern Present (10/17/2023)   [employer] of Occupational Health - Occupational Stress Questionnaire    Feeling of Stress: Only a little  Social Connections: Socially Integrated (10/17/2023)   Social Connection and Isolation Panel    Frequency of Communication with Friends and Family: Three times a week    Frequency of Social Gatherings with Friends and Family: Three times a week    Attends Religious Services: More than 4 times per year    Active Member of Clubs or Organizations: Yes    Attends Banker Meetings: 1 to 4 times per year    Marital Status: Married  Catering manager Violence: Not At Risk (07/06/2021)   Humiliation, Afraid, Rape, and Kick questionnaire    Fear of Current or Ex-Partner: No    Emotionally Abused: No    Physically Abused: No    Sexually Abused: No    Family History  Problem Relation Age of Onset   Breast cancer Mother 80   Colon cancer Mother 8   Cervical cancer Mother 10       s/p hysterectomy   Heart attack Father 41   Diabetes Father        probable pre DM   Skin cancer Father        maybe melanoma   Cancer Sister 42       appendiceal   Heart attack Maternal Grandfather 57   Heart attack Paternal Uncle        X2 ; >50y   Heart attack Maternal Uncle        d. 73   Parkinson's disease Maternal Grandmother        d. 78   Heart attack Paternal Grandfather    Breast cancer Other        unspecified age; maternal great aunt (MGM's sister)   Breast cancer Cousin        maternal 1st cousin, once-removed; unspecified age   Heart attack Paternal Uncle        >50y   Stroke Neg Hx     Current Outpatient Medications on  File Prior to Visit  Medication Sig Dispense Refill   albuterol  (VENTOLIN  HFA) 108 (90 Base) MCG/ACT inhaler Inhale 2 puffs into the lungs every 6 (six) hours as  needed for wheezing or shortness of breath. 8 g 3   amLODipine  (NORVASC ) 5 MG tablet TAKE ONE TABLET DAILY 90 tablet 2   aspirin 81 MG tablet Take 81 mg by mouth daily.     budesonide -formoterol  (SYMBICORT ) 160-4.5 MCG/ACT inhaler INHALE 1 to 2 PUFFS EVERY TWELVE HOURS AS NEEDED. GARGLE AND EXPECTORATE AFTER USE 10.2 g 3   calcium  citrate-vitamin D  (CITRACAL+D) 315-200 MG-UNIT per tablet Take 1 tablet by mouth daily.     Carboxymethylcellulose Sod PF (RETAINE CMC) 0.5 % SOLN Apply to eye.     Cholecalciferol (VITAMIN D3) 1000 UNITS CAPS Take by mouth daily.     clobetasol (OLUX) 0.05 % topical foam SMARTSIG:sparingly Topical Twice Daily PRN     co-enzyme Q-10 30 MG capsule Take 50 mg by mouth daily.     FINACEA 15 % cream Apply 1 application topically 2 (two) times daily. For rosacea  0   hydrochlorothiazide  (MICROZIDE ) 12.5 MG capsule TAKE ONE CAPSULE DAILY 90 capsule 2   hydrocortisone 2.5 % cream Apply topically 2 (two) times daily as needed.     ketoconazole (NIZORAL) 2 % cream SMARTSIG:sparingly Topical Twice Daily     losartan  (COZAAR ) 100 MG tablet TAKE ONE TABLET DAILY 90 tablet 3   Misc Natural Products (TART CHERRY ADVANCED PO) Take 2,400 mg by mouth daily.     montelukast  (SINGULAIR ) 10 MG tablet TAKE 1 TABLET(10 MG) BY MOUTH AT BEDTIME 90 tablet 3   Multiple Vitamins-Minerals (MULTIVITAMIN WITH MINERALS) tablet Take 1 tablet by mouth daily.     OMEGA 3 1000 MG CAPS Take 1,000 mg by mouth daily.     pantoprazole  (PROTONIX ) 40 MG tablet TAKE ONE TABLET DAILY 90 tablet 3   psyllium (METAMUCIL) 58.6 % powder Take 1 packet by mouth daily.     rosuvastatin  (CRESTOR ) 5 MG tablet TAKE ONE TABLET BY MOUTH EVERY DAY 90 tablet 3   No current facility-administered medications on file prior to visit.    No Known Allergies      Physical Exam Vitals requested from patient and listed below if patient had equipment and was able to obtain at home for this virtual visit: There were no vitals filed for this visit. Estimated body mass index is 22.57 kg/m as calculated from the following:   Height as of 04/15/23: 4' 10 (1.473 m).   Weight as of 04/15/23: 108 lb (49 kg).  EKG (optional): deferred due to virtual visit  GENERAL: alert, oriented, no acute distress detected, full vision exam deferred due to pandemic and/or virtual encounter  HEENT: atraumatic, conjunttiva clear, no obvious abnormalities on inspection of external nose and ears  NECK: normal movements of the head and neck  LUNGS: on inspection no signs of respiratory distress, breathing rate appears normal, no obvious gross SOB, gasping or wheezing  CV: no obvious cyanosis  MS: moves all visible extremities without noticeable abnormality  PSYCH/NEURO: pleasant and cooperative, no obvious depression or anxiety, speech and thought processing grossly intact, Cognitive function grossly intact  Flowsheet Row Clinical Support from 10/18/2023 in [facility]  PHQ-9 Total Score 0        10/18/2023   10:40 AM 01/12/2023    7:54 AM 07/08/2022    9:46 AM 07/08/2022    9:45 AM 01/26/2022    2:39 PM  Depression screen PHQ 2/9  Decreased Interest 0 0 0 0 0  Down, Depressed, Hopeless 0 0 0 0 0  PHQ - 2 Score 0  0 0 0 0  Altered sleeping 0  1    Tired, decreased energy 0  0    Change in appetite 0  0    Feeling bad or failure about yourself  0  0    Trouble concentrating 0  0    Moving slowly or fidgety/restless 0  0    Suicidal thoughts 0  0    PHQ-9 Score 0  1    Difficult doing work/chores   Not difficult at all         11/17/2021   10:03 AM 01/26/2022    2:39 PM 02/04/2022   11:00 AM 07/08/2022    9:45 AM 10/18/2023   10:39 AM  Fall Risk  Falls in the past year? 0 0  0 0  Was there an injury with Fall? 0 0  0 0   Fall Risk Category Calculator 0 0  0 0  Fall Risk Category (Retired) Low  Low      (RETIRED) Patient Fall Risk Level Low fall risk  Low fall risk  Low fall risk     Patient at Risk for Falls Due to No Fall Risks No Fall Risks   No Fall Risks  Fall risk Follow up Falls evaluation completed  Falls evaluation completed    Falls evaluation completed     Data saved with a previous flowsheet row definition     SUMMARY AND PLAN:  Encounter for Medicare annual wellness exam    Discussed applicable health maintenance/preventive health measures and advised and referred or ordered per patient preferences: -discussed vaccines due risks and recs, advised she can get at the pharmacy -discussed bone density, normal in 2023 and she opted to hold off on this for now Health Maintenance  Topic Date Due   COVID-19 Vaccine (4 - 2024-25 season) 11/03/2023 (Originally 01/02/2023)   DTaP/Tdap/Td (2 - Td or Tdap) 12/29/2023 (Originally 02/17/2022)   INFLUENZA VACCINE  12/02/2023   Medicare Annual Wellness (AWV)  10/17/2024   Pneumococcal Vaccine: 50+ Years  Completed   DEXA SCAN  Completed   Hepatitis C Screening  Completed   Zoster Vaccines- Shingrix  Completed   HPV VACCINES  Aged Out   Meningococcal B Vaccine  Aged Out   Colonoscopy  Discontinued      Education and counseling on the following was provided based on the above review of health and a plan/checklist for the patient, along with additional information discussed, was provided for the patient in the patient instructions :  -Provided counseling and plan for increased risk of falling if applicable per above screening. Reviewed and demonstrated safe balance exercises that can be done at home to improve balance and discussed exercise guidelines for adults with include balance exercises at least 3 days per week.  -Advised and counseled on a healthy lifestyle  -Reviewed patient's current diet. Advised and counseled on a whole foods based healthy  diet. A summary of a healthy diet was provided in the Patient Instructions.  -reviewed patient's current physical activity level and discussed exercise guidelines for adults. Congratulated on healthy exercise pattern and encouraged to continue. Further resources provided in patient instructions  -Advise yearly dental visits at minimum and regular eye exams -Advised and counseled on alcohol safe limits, risks  Follow up: see patient instructions     Patient Instructions  I really enjoyed getting to talk with you today! I am available on Tuesdays and Thursdays for virtual visits if you have any questions or concerns,  or if I can be of any further assistance.   CHECKLIST FROM ANNUAL WELLNESS VISIT:  -Follow up (please call to schedule if not scheduled after visit):   -yearly for annual wellness visit with primary care office  Here is a list of your preventive care/health maintenance measures and the plan for each if any are due:  PLAN For any measures below that may be due:   Can get vaccines at the pharmacy. When you do, please bring copy of receipt so that we can update your record. Thanks!  2. If you would like to do a bone density test at any point, please let us  know.  3. Please get your mammogram yearly.  Health Maintenance  Topic Date Due   COVID-19 Vaccine (4 - 2024-25 season) 11/03/2023 (Originally 01/02/2023)   DTaP/Tdap/Td (2 - Td or Tdap) 12/29/2023 (Originally 02/17/2022)   INFLUENZA VACCINE  12/02/2023   Medicare Annual Wellness (AWV)  10/17/2024   Pneumococcal Vaccine: 50+ Years  Completed   DEXA SCAN  Completed   Hepatitis C Screening  Completed   Zoster Vaccines- Shingrix  Completed   HPV VACCINES  Aged Out   Meningococcal B Vaccine  Aged Out   Colonoscopy  Discontinued    -See a dentist at least yearly  -Get your eyes checked and then per your eye specialist's recommendations  -Other issues addressed today:   1. Alcohol: please limit alcohol intake to no  more than once alcoholic beverage (5 oz of wine) per any given 24 hour period.   -I have included below further information regarding a healthy whole foods based diet, physical activity guidelines for adults, stress management and opportunities for social connections. I hope you find this information useful.   -----------------------------------------------------------------------------------------------------------------------------------------------------------------------------------------------------------------------------------------------------------    NUTRITION: -eat real food: lots of colorful vegetables (half the plate) and fruits -5-7 servings of vegetables and fruits per day (fresh or steamed is best), exp. 2 servings of vegetables with lunch and dinner and 2 servings of fruit per day. Berries and greens such as kale and collards are great choices.  -consume on a regular basis:  fresh fruits, fresh veggies, fish, nuts, seeds, healthy oils (such as olive oil, avocado oil), whole grains (make sure for bread/pasta/crackers/etc., that the first ingredient on label contains the word whole), legumes. -can eat small amounts of dairy and lean meat (no larger than the palm of your hand), but avoid processed meats such as ham, bacon, lunch meat, etc. -drink water -try to avoid fast food and pre-packaged foods, processed meat, ultra processed foods/beverages (donuts, candy, etc.) -most experts advise limiting sodium to < 2300mg  per day, should limit further is any chronic conditions such as high blood pressure, heart disease, diabetes, etc. The American Heart Association advised that < 1500mg  is is ideal -try to avoid foods/beverages that contain any ingredients with names you do not recognize  -try to avoid foods/beverages  with added sugar or sweeteners/sweets  -try to avoid sweet drinks (including diet drinks): soda, juice, Gatorade, sweet tea, power drinks, diet drinks -try to avoid white  rice, white bread, pasta (unless whole grain)  EXERCISE GUIDELINES FOR ADULTS: -if you wish to increase your physical activity, do so gradually and with the approval of your doctor -STOP and seek medical care immediately if you have any chest pain, chest discomfort or trouble breathing when starting or increasing exercise  -move and stretch your body, legs, feet and arms when sitting for long periods -Physical activity guidelines for optimal health in adults: -  get at least 150 minutes per week of moderate exercise (can talk, but not sing); this is about 20-30 minutes of sustained activity 5-7 days per week or two 10-15 minute episodes of sustained activity 5-7 days per week -do some muscle building/resistance training/strength training at least 2 days per week  -balance exercises 3+ days per week:   Stand somewhere where you have something sturdy to hold onto if you lose balance    1) lift up on toes, then back down, start with 5x per day and work up to 20x   2) stand and lift one leg straight out to the side so that foot is a few inches of the floor, start with 5x each side and work up to 20x each side   3) stand on one foot, start with 5 seconds each side and work up to 20 seconds on each side  If you need ideas or help with getting more active:  -Silver sneakers https://tools.silversneakers.com  -Walk with a Doc: http://www.duncan-williams.com/  -try to include resistance (weight lifting/strength building) and balance exercises twice per week: or the following link for ideas: http://castillo-powell.com/  BuyDucts.dk  STRESS MANAGEMENT: -can try meditating, or just sitting quietly with deep breathing while intentionally relaxing all parts of your body for 5 minutes daily -if you need further help with stress, anxiety or depression please follow up with your primary doctor or contact the wonderful folks  at [facility]: [phone]  SOCIAL CONNECTIONS: -options in [place] if you wish to engage in more social and exercise related activities:  -Silver sneakers https://tools.silversneakers.com  -Walk with a Doc: http://www.duncan-williams.com/  -Check out the [facility] Active Adults 50+ section on the [place] of [employer] (hiking clubs, book clubs, cards and games, chess, exercise classes, aquatic classes and much more) - see the website for details: https://www.-[place].gov/departments/parks-recreation/active-adults50  -YouTube has lots of exercise videos for different ages and abilities as well  -[facility] (a variety of indoor and outdoor inperson activities for adults). [phone]. [address]  -Virtual Online Classes (a variety of topics): see seniorplanet.org or call [phone]  -consider volunteering at a school, hospice center, church, senior center or elsewhere            Maurie Southern, DO

## 2023-10-18 NOTE — Progress Notes (Signed)
 Patient was unable to self-report due to a lack of equipment at home via telehealth

## 2023-10-26 ENCOUNTER — Other Ambulatory Visit: Payer: Self-pay | Admitting: Family Medicine

## 2023-10-26 DIAGNOSIS — I10 Essential (primary) hypertension: Secondary | ICD-10-CM

## 2023-11-22 DIAGNOSIS — R233 Spontaneous ecchymoses: Secondary | ICD-10-CM | POA: Diagnosis not present

## 2024-01-23 DIAGNOSIS — H353121 Nonexudative age-related macular degeneration, left eye, early dry stage: Secondary | ICD-10-CM | POA: Diagnosis not present

## 2024-01-23 DIAGNOSIS — H5202 Hypermetropia, left eye: Secondary | ICD-10-CM | POA: Diagnosis not present

## 2024-01-23 DIAGNOSIS — H02831 Dermatochalasis of right upper eyelid: Secondary | ICD-10-CM | POA: Diagnosis not present

## 2024-01-23 DIAGNOSIS — H02834 Dermatochalasis of left upper eyelid: Secondary | ICD-10-CM | POA: Diagnosis not present

## 2024-01-23 DIAGNOSIS — H524 Presbyopia: Secondary | ICD-10-CM | POA: Diagnosis not present

## 2024-01-23 DIAGNOSIS — H353112 Nonexudative age-related macular degeneration, right eye, intermediate dry stage: Secondary | ICD-10-CM | POA: Diagnosis not present

## 2024-02-02 ENCOUNTER — Other Ambulatory Visit: Payer: Self-pay | Admitting: Family Medicine

## 2024-02-02 DIAGNOSIS — I10 Essential (primary) hypertension: Secondary | ICD-10-CM

## 2024-02-06 DIAGNOSIS — L218 Other seborrheic dermatitis: Secondary | ICD-10-CM | POA: Diagnosis not present

## 2024-02-06 DIAGNOSIS — D485 Neoplasm of uncertain behavior of skin: Secondary | ICD-10-CM | POA: Diagnosis not present

## 2024-02-06 DIAGNOSIS — Z85828 Personal history of other malignant neoplasm of skin: Secondary | ICD-10-CM | POA: Diagnosis not present

## 2024-02-06 DIAGNOSIS — C44519 Basal cell carcinoma of skin of other part of trunk: Secondary | ICD-10-CM | POA: Diagnosis not present

## 2024-02-06 DIAGNOSIS — L570 Actinic keratosis: Secondary | ICD-10-CM | POA: Diagnosis not present

## 2024-02-07 ENCOUNTER — Inpatient Hospital Stay: Payer: Medicare PPO | Attending: Adult Health | Admitting: Adult Health

## 2024-02-07 ENCOUNTER — Encounter: Payer: Self-pay | Admitting: Adult Health

## 2024-02-07 VITALS — BP 127/64 | HR 60 | Temp 97.6°F | Resp 16 | Ht <= 58 in | Wt 111.9 lb

## 2024-02-07 DIAGNOSIS — Z853 Personal history of malignant neoplasm of breast: Secondary | ICD-10-CM | POA: Insufficient documentation

## 2024-02-07 DIAGNOSIS — Z08 Encounter for follow-up examination after completed treatment for malignant neoplasm: Secondary | ICD-10-CM | POA: Diagnosis not present

## 2024-02-07 DIAGNOSIS — Z9013 Acquired absence of bilateral breasts and nipples: Secondary | ICD-10-CM | POA: Insufficient documentation

## 2024-02-07 NOTE — Progress Notes (Signed)
 [name] Cancer Follow up:    Courtney [name], [name], MD [address] [place] [address]   DIAGNOSIS: Cancer Staging  No matching staging information was found for the patient.    SUMMARY OF ONCOLOGIC HISTORY: (1) status post bilateral mastectomies with bilateral sentinel lymph node sampling 01/13/2011, showing             (a) on the left, 2 foci of invasive ductal carcinoma, the larger measuring 1.2 cm, grade 2, both tumors being strongly estrogen and progesterone receptor positive, with MIB-once of 13-14%, and HER-2/neu negative. Her (pT1c pN0 = stage IA)             (b) on the right, apT1c pN0, stage IA invasive ductal carcinoma, grade 2, estrogen receptor 100% positive, progesterone receptor 9% positive, with an MIB-1 of 12% and no HER-2 amplification   (2) Oncotype DX on the largest tumor showed a score of 10, suggesting a risk of recurrence outside of the breast within the next 10 years would be 7% if the patient only systemic therapy was tamoxifen for 5 years. It also predicts no benefit from chemotherapy   (3) letrozole  from October of 2012 through 02/2016             A.  Most recent bone density in January 2023 was normal.    CURRENT THERAPY: observation  INTERVAL HISTORY:  Discussed the use of AI scribe software for clinical note transcription with the patient, who gave verbal consent to proceed.  History of Present Illness Courtney [name] is an 78 year old female with a history of bilateral breast cancer who presents for follow-up and evaluation.  She has undergone bilateral mastectomies for Stage 1A, ER, PR positive, HER2 negative, invasive ductal carcinoma. She completed a five-year course of anastrozole in October 2017 and has been under observation since. There are no new changes in her health since her last visit a year ago. An MRI performed about a year ago was normal.    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   Bursitis of left hip 04/01/2021   Degenerative arthritis of right knee 12/05/2020   Metatarsalgia 12/05/2020   Scoliosis 09/11/2018   Rosacea 07/11/2018   OSA (obstructive sleep apnea) 08/03/2017   Prolapsed internal hemorrhoids, grade 3 max 06/27/2017   Wheezing 06/22/2017   Diverticulosis    Night sweats    Intermittent palpitations 01/10/2017   Mild intermittent asthma 08/31/2015   GERD (gastroesophageal reflux disease) 07/25/2015   Varicose veins of bilateral lower extremities with other complications 03/18/2015   Arthritis 05/27/2014   Skin cancer, basal cell 02/20/2014   Fasting hyperglycemia 02/29/2012   History of bilateral breast cancer 02/29/2012   Osteopenia 02/13/2012   HTN (hypertension) 04/06/2011   History of colonic polyps 01/14/2010   DIVERTICULOSIS, COLON 01/25/2007    has no known allergies.  MEDICAL HISTORY: Past Medical History:  Diagnosis Date   Asthma    Breast cancer (HCC) 11/2010   post double mastectomy; Dr Fernand   Diverticulosis    Family history of breast cancer    Fundic gland polyps of stomach, benign 07/2022   GERD (gastroesophageal reflux disease)  S/P dilation X 1   Hemorrhoids    Hypertension    Night sweats    OSA (obstructive sleep apnea) 08/03/2017   Very mild with AHI 7.2/hr.     Prolapsed internal hemorrhoids, grade 3 06/27/2017   Grade 2 on anoscopy exam but grade 3 by history and seen a colonoscopy as well All 3 columns banded 06/27/2017  09/21/2017 banded RP and RA     Reactive airway disease     SURGICAL HISTORY: Past Surgical History:  Procedure Laterality Date   ABDOMINAL HYSTERECTOMY     bilateral breast reconstruction  Jan 2013    BREAST SURGERY  01/13/11   bilateral total mastectomy; Dr Gail   COLONOSCOPY  2014   Dr Obie; hyperplastic polyp   ESOPHAGEAL DILATION  2014   HEMORRHOID BANDING  2019   February and May   NASAL SEPTUM SURGERY     RECONSTRUCTION / CORRECTION OF NIPPLE / MARIJANE  June 2013   THYROGLOSSAL DUCT CYST      SOCIAL HISTORY: Social History   Socioeconomic History   Marital status: Married    Spouse name: Not on file   Number of children: Not on file   Years of education: Not on file   Highest education level: Bachelor's degree (e.g., BA, AB, BS)  Occupational History   Not on file  Tobacco Use   Smoking status: Former    Current packs/day: 0.00    Types: Cigarettes    Start date: 05/03/1960    Quit date: 05/03/1965    Years since quitting: 58.8   Smokeless tobacco: Never   Tobacco comments:    smoked 1962-1967, less than 1 ppd  Vaping Use   Vaping status: Never Used  Substance and Sexual Activity   Alcohol use: Yes    Alcohol/week: 14.0 standard drinks of alcohol    Types: 14 Glasses of wine per week    Comment: 2-3 /day   Drug use: No   Sexual activity: Yes  Other Topics Concern   Not on file  Social History Narrative   Married   2-3 glasses wine/day   remote small vol smmoker   Social Drivers of Health   Financial Resource Strain: Low Risk  (10/17/2023)   Overall Financial Resource Strain (CARDIA)    Difficulty of Paying Living Expenses: Not hard at all  Food Insecurity: No Food Insecurity (10/17/2023)   Hunger Vital Sign    Worried About Running Out of Food in the Last Year: Never true    Ran Out of Food in the Last Year: Never true  Transportation Needs: No Transportation Needs (10/17/2023)   PRAPARE - Administrator, Civil Service (Medical): No    Lack of Transportation (Non-Medical): No  Physical Activity: Sufficiently Active (10/17/2023)   Exercise Vital Sign    Days of Exercise per Week: 5 days    Minutes of Exercise per Session: 50 min   Stress: No Stress Concern Present (10/17/2023)   [employer] of Occupational Health - Occupational Stress Questionnaire    Feeling of Stress: Only a little  Social Connections: Socially Integrated (10/17/2023)   Social Connection and Isolation Panel    Frequency of Communication with Friends and Family: Three times a week    Frequency of Social Gatherings with Friends and Family: Three times a week    Attends Religious Services: More than 4 times per year    Active Member of Clubs or Organizations: Yes    Attends Banker  Meetings: 1 to 4 times per year    Marital Status: Married  Catering manager Violence: Not At Risk (07/06/2021)   Humiliation, Afraid, Rape, and Kick questionnaire    Fear of Current or Ex-Partner: No    Emotionally Abused: No    Physically Abused: No    Sexually Abused: No    FAMILY HISTORY: Family History  Problem Relation Age of Onset   Breast cancer Mother 58   Colon cancer Mother 48   Cervical cancer Mother 11       s/p hysterectomy   Heart attack Father 15   Diabetes Father        probable pre DM   Skin cancer Father        maybe melanoma   Cancer Sister 10       appendiceal   Heart attack Maternal Grandfather 57   Heart attack Paternal Uncle        X2 ; >50y   Heart attack Maternal Uncle        d. 16   Parkinson's disease Maternal Grandmother        d. 18   Heart attack Paternal Grandfather    Breast cancer Other        unspecified age; maternal great aunt (MGM's sister)   Breast cancer Cousin        maternal 1st cousin, once-removed; unspecified age   Heart attack Paternal Uncle        >50y   Stroke Neg Hx     Review of Systems  Constitutional:  Negative for appetite change, chills, fatigue, fever and unexpected weight change.  HENT:   Negative for hearing loss, lump/mass and trouble swallowing.   Eyes:  Negative for eye problems and icterus.  Respiratory:  Negative for chest tightness, cough and shortness of breath.    Cardiovascular:  Negative for chest pain, leg swelling and palpitations.  Gastrointestinal:  Negative for abdominal distention, abdominal pain, constipation, diarrhea, nausea and vomiting.  Endocrine: Negative for hot flashes.  Genitourinary:  Negative for difficulty urinating.   Musculoskeletal:  Negative for arthralgias.  Skin:  Negative for itching and rash.  Neurological:  Negative for dizziness, extremity weakness, headaches and numbness.  Hematological:  Negative for adenopathy. Does not bruise/bleed easily.  Psychiatric/Behavioral:  Negative for depression. The patient is not nervous/anxious.       PHYSICAL EXAMINATION   Onc Performance Status - 02/07/24 1232       ECOG Perf Status   ECOG Perf Status Restricted in physically strenuous activity but ambulatory and able to carry out work of a light or sedentary nature, e.g., light house work, office work      KPS SCALE   KPS % SCORE Able to carry on normal activity, minor s/s of disease          Vitals:   02/07/24 1229  BP: 127/64  Pulse: 60  Resp: 16  Temp: 97.6 F (36.4 C)  SpO2: 99%    Physical Exam Constitutional:      General: She is not in acute distress.    Appearance: Normal appearance. She is not toxic-appearing.  HENT:     Head: Normocephalic and atraumatic.     Mouth/Throat:     Mouth: Mucous membranes are moist.     Pharynx: Oropharynx is clear. No oropharyngeal exudate or posterior oropharyngeal erythema.  Eyes:     General: No scleral icterus. Cardiovascular:     Rate and Rhythm: Normal rate and regular rhythm.  Pulses: Normal pulses.     Heart sounds: Normal heart sounds.  Pulmonary:     Effort: Pulmonary effort is normal.     Breath sounds: Normal breath sounds.  Chest:     Comments: S/p bilateral mastectomies and reconstruction, no sign of breast cancer recurrence Abdominal:     General: Abdomen is flat. Bowel sounds are normal. There is no distension.     Palpations: Abdomen is  soft.     Tenderness: There is no abdominal tenderness.  Musculoskeletal:        General: No swelling.     Cervical back: Neck supple.  Lymphadenopathy:     Cervical: No cervical adenopathy.     Upper Body:     Right upper body: No supraclavicular or axillary adenopathy.     Left upper body: No supraclavicular or axillary adenopathy.  Skin:    General: Skin is warm and dry.     Findings: No rash.  Neurological:     General: No focal deficit present.     Mental Status: She is alert.  Psychiatric:        Mood and Affect: Mood normal.        Behavior: Behavior normal.     ASSESSMENT and THERAPY PLAN:   Assessment and Plan Assessment & Plan Bilateral breast cancer, status post bilateral mastectomies Stage 1A, ER, PR positive, HER2 negative, invasive ductal carcinoma. Completed anastrozole therapy. MRI normal, no recurrence. - Continue annual follow-up visits. - Ensure annual breast examination. - Encourage immediate reporting of new symptoms or concerns. - Continue healthy diet and exercise.   All questions were answered. The patient knows to call the clinic with any problems, questions or concerns. We can certainly see the patient much sooner if necessary.  Total encounter time:15 minutes*in face-to-face visit time, chart review, lab review, care coordination, order entry, and documentation of the encounter time.    Morna Kendall, NP 02/07/24 12:45 PM Medical Oncology and Hematology [facility] [address] [place], [place] [address] Tel. [phone]    Fax. [phone]  *Total Encounter Time as defined by the Centers for Medicare and Medicaid Services includes, in addition to the face-to-face time of a patient visit (documented in the note above) non-face-to-face time: obtaining and reviewing outside history, ordering and reviewing medications, tests or procedures, care coordination (communications with other health care professionals or caregivers) and  documentation in the medical record.

## 2024-02-13 DIAGNOSIS — C44519 Basal cell carcinoma of skin of other part of trunk: Secondary | ICD-10-CM | POA: Diagnosis not present

## 2024-02-20 ENCOUNTER — Ambulatory Visit (INDEPENDENT_AMBULATORY_CARE_PROVIDER_SITE_OTHER): Admitting: Family Medicine

## 2024-02-20 ENCOUNTER — Encounter: Payer: Self-pay | Admitting: Family Medicine

## 2024-02-20 VITALS — BP 124/70 | HR 56 | Temp 98.0°F | Resp 16 | Ht <= 58 in | Wt 109.8 lb

## 2024-02-20 DIAGNOSIS — Z23 Encounter for immunization: Secondary | ICD-10-CM

## 2024-02-20 DIAGNOSIS — K219 Gastro-esophageal reflux disease without esophagitis: Secondary | ICD-10-CM | POA: Diagnosis not present

## 2024-02-20 DIAGNOSIS — Z0000 Encounter for general adult medical examination without abnormal findings: Secondary | ICD-10-CM | POA: Diagnosis not present

## 2024-02-20 DIAGNOSIS — E782 Mixed hyperlipidemia: Secondary | ICD-10-CM | POA: Diagnosis not present

## 2024-02-20 DIAGNOSIS — E049 Nontoxic goiter, unspecified: Secondary | ICD-10-CM | POA: Diagnosis not present

## 2024-02-20 DIAGNOSIS — I10 Essential (primary) hypertension: Secondary | ICD-10-CM

## 2024-02-20 DIAGNOSIS — R7303 Prediabetes: Secondary | ICD-10-CM | POA: Diagnosis not present

## 2024-02-20 DIAGNOSIS — J4520 Mild intermittent asthma, uncomplicated: Secondary | ICD-10-CM

## 2024-02-20 LAB — COMPREHENSIVE METABOLIC PANEL WITH GFR
ALT: 24 U/L (ref 0–35)
AST: 23 U/L (ref 0–37)
Albumin: 4.6 g/dL (ref 3.5–5.2)
Alkaline Phosphatase: 33 U/L — ABNORMAL LOW (ref 39–117)
BUN: 16 mg/dL (ref 6–23)
CO2: 28 meq/L (ref 19–32)
Calcium: 9.5 mg/dL (ref 8.4–10.5)
Chloride: 105 meq/L (ref 96–112)
Creatinine, Ser: 0.75 mg/dL (ref 0.40–1.20)
GFR: 75.32 mL/min
Glucose, Bld: 98 mg/dL (ref 70–99)
Potassium: 4.1 meq/L (ref 3.5–5.1)
Sodium: 142 meq/L (ref 135–145)
Total Bilirubin: 0.9 mg/dL (ref 0.2–1.2)
Total Protein: 7.3 g/dL (ref 6.0–8.3)

## 2024-02-20 LAB — HEMOGLOBIN A1C: Hgb A1c MFr Bld: 6 % (ref 4.6–6.5)

## 2024-02-20 LAB — LIPID PANEL
Cholesterol: 166 mg/dL (ref 0–200)
HDL: 77.1 mg/dL
LDL Cholesterol: 75 mg/dL (ref 0–99)
NonHDL: 88.93
Total CHOL/HDL Ratio: 2
Triglycerides: 70 mg/dL (ref 0.0–149.0)
VLDL: 14 mg/dL (ref 0.0–40.0)

## 2024-02-20 LAB — TSH: TSH: 2.28 u[IU]/mL (ref 0.35–5.50)

## 2024-02-20 MED ORDER — AMLODIPINE BESYLATE 5 MG PO TABS: 5.0000 mg | ORAL_TABLET | Freq: Every day | ORAL | 2 refills | Status: AC

## 2024-02-20 MED ORDER — BUDESONIDE-FORMOTEROL FUMARATE 160-4.5 MCG/ACT IN AERO: INHALATION_SPRAY | RESPIRATORY_TRACT | 2 refills | Status: AC

## 2024-02-20 MED ORDER — LOSARTAN POTASSIUM 100 MG PO TABS
ORAL_TABLET | ORAL | 3 refills | Status: AC
Start: 2024-02-20 — End: ?

## 2024-02-20 MED ORDER — ROSUVASTATIN CALCIUM 5 MG PO TABS
ORAL_TABLET | ORAL | 3 refills | Status: AC
Start: 2024-02-20 — End: ?

## 2024-02-20 NOTE — Patient Instructions (Addendum)
 A few things to remember from today's visit:  Routine general medical examination at a health care facility  Mild intermittent asthma, unspecified whether complicated  Primary hypertension - Plan: amLODipine  (NORVASC ) 5 MG tablet, losartan  (COZAAR ) 100 MG tablet  Influenza vaccine needed - Plan: Flu vaccine HIGH DOSE PF(Fluzone Trivalent)  If you need refills for medications you take chronically, please call your pharmacy. Do not use My Chart to request refills or for acute issues that need immediate attention. If you send a my chart message, it may take a few days to be addressed, specially if I am not in the office.  Please be sure medication list is accurate. If a new problem present, please set up appointment sooner than planned today.  Do not resume Singulair  and stop Albuterol . Continue Symbicort  as needed. Monitor blood pressure regularly. Try to decrease a;alcohol intake. Keep appt with gastroenterologist.

## 2024-02-20 NOTE — Progress Notes (Unsigned)
 HPI: Courtney [name] is a 78 y.o. female, who is here today for her routine physical. She also has a few concerns she would like to discuss today.   Last CPE: 01/12/23 Does water aerobics 2 days a week and walks about 3 days a week.  She tends to eat home cooked meals, has vegetables daily, and regularly eats fish.  On average she is sleeping about 8-9 hours a night. She quit smoking in 1967, only did in college.  Currently is drinking 1-2 glasses, previously would drink 2-3 glasses.   She reports she has lost weight in the last year, has decreased oral intake. She has been trying to reduce her alcohol intake. Discussed the use of AI scribe software for clinical note transcription with the patient, who gave verbal consent to proceed.  History of Present Illness Courtney [name] is an 78 year old female who presents for an annual physical exam and follow-up on hypertension.  She has been taking amlodipine  and hydrochlorothiazide  12.5 mg for her hypertension but has not been monitoring her blood pressure at home. She maintains an active lifestyle, participating in water aerobics twice a week and walking for 45 minutes several times a week.  She experiences acid reflux, particularly after consuming fatty foods like pizza. She takes pantoprazole  40 mg before breakfast and uses Tums at night to manage symptoms. She has an upcoming appointment with her gastroenterologist and has previously undergone an upper endoscopy.  She uses Symbicort  as needed and albuterol  at night for respiratory symptoms. She has not used Symbicort  recently and reports that she has not had problems with wheezing. She experiences phlegm and drainage, leading to morning coughing. She is not currently taking Singulair .  She reports difficulty swallowing solid foods, with food often getting caught in her throat. Spicy foods exacerbate her throat irritation and coughing.  She has a history of basal cell carcinoma and  squamous cell carcinoma, for which she regularly visits a dermatologist. She has had Mohs surgery for squamous cell carcinoma and frequently has lesions treated during her dermatology visits.  She sleeps 8-9 hours per night but has had poor sleep recently due to acid reflux and racing thoughts. She consumes 1-2 glasses of alcohol per day and does not smoke.  She reports bruising on her forearm after dragging her dog by the collar. She has a history of a thyroid  duct cyst removal and reports a sensation of something in her throat when swallowing.   Immunization History  Administered Date(s) Administered    sv, Bivalent, Protein Subunit Rsvpref,pf (Abrysvo) 01/28/2023   Fluad Quad(high Dose 65+) 01/29/2019, 02/23/2021, 03/05/2022   Fluad Trivalent(High Dose 65+) 01/12/2023   INFLUENZA, HIGH DOSE SEASONAL PF 02/16/2013, 03/06/2015, 01/06/2016, 01/10/2017, 03/07/2018, 02/20/2024   Influenza Split 02/18/2012   Influenza,inj,Quad PF,6+ Mos 02/19/2014   Influenza-Unspecified 12/31/2015, 01/29/2019, 01/24/2020   PFIZER Comirnaty(Gray Top)Covid-19 Tri-Sucrose Vaccine 08/01/2020   PFIZER(Purple Top)SARS-COV-2 Vaccination 05/24/2019, 06/14/2019, 01/24/2020   PNEUMOCOCCAL CONJUGATE-20 03/16/2021   PPD Test 04/17/2012   Pneumococcal Conjugate-13 01/03/2015   Pneumococcal Polysaccharide-23 05/03/2008, 02/22/2013   Tdap 02/18/2012, 01/28/2023   Zoster Recombinant(Shingrix) 03/21/2017, 03/16/2018, 03/16/2018   Zoster, Live 01/14/2010   Health Maintenance  Topic Date Due   COVID-19 Vaccine (5 - 2025-26 season) 03/07/2024 (Originally 01/02/2024)   Medicare Annual Wellness (AWV)  10/17/2024   DTaP/Tdap/Td (3 - Td or Tdap) 01/27/2033   Pneumococcal Vaccine: 50+ Years  Completed   Influenza Vaccine  Completed   DEXA SCAN  Completed  Zoster Vaccines- Shingrix  Completed   Meningococcal B Vaccine  Aged Out   Colonoscopy  Discontinued   Hepatitis C Screening  Discontinued  She does not require  mammograms anymore, had a double mastectomy.   -Carpal tunnel: She complains of intermittent numbness/tingling in extremities, which improved after discontinuing Singulair .  Is still having numbness in her left index finger and thumb associated with cyanosis of fingertips, which starting a few months ago.  She wonders if this is caused by carpal tunnel syndrome. Denies fingertips pain or ulcers.  She notes these have occurred when using her phone, holding the dumbbells too tight when exercising, or when lying on her right side.   Also talks cyanosis and cold feet.  She reports having hammer toes and metatarsal problems on her left foot.   Cough*** Asthma on Symbicort  160-4.5 mcg twice daily and albuterol  inhaler to use as needed. CXR in 06/2021 done due to cough and shortness of breath was negative for acute abnormalities.  -Pre-Diabetes: Negative for polydipsia, polyuria, polyphagia. Lab Results  Component Value Date   HGBA1C 6.0 01/12/2023    -Hypertension: Currently she is on amlodipine  5 mg daily, losartan  100 mg daily, and hydrochlorothiazide  12.5 mg daily.  Not checking her BP at home.  Negative for unusual or severe headache, visual changes, exertional chest pain, focal weakness, or edema.  Lab Results  Component Value Date   NA 140 01/12/2023   CL 104 01/12/2023   K 4.5 01/12/2023   CO2 30 01/12/2023   BUN 21 01/12/2023   CREATININE 0.78 01/12/2023   GFR 72.41 01/12/2023   CALCIUM  10.4 01/12/2023   ALBUMIN 4.5 01/12/2023   GLUCOSE 93 01/12/2023     GERD:She is currently on pantoprazole  40 mg daily.  She still gets occasional  acid reflux even with taking pantoprazole , which she notes tends to happen when she eats fatty foods.   HLD: She is currently on rosuvastatin  5 mg daily.  Has been managing a healthy diet.  Lab Results  Component Value Date   CHOL 164 01/12/2023   HDL 85.20 01/12/2023   LDLCALC 66 01/12/2023   LDLDIRECT 131.3 02/29/2012   TRIG 62.0  01/12/2023   CHOLHDL 2 01/12/2023     Review of Systems  Constitutional:  Positive for activity change. Negative for appetite change, chills and fever.  HENT:  Negative for congestion, hearing loss, mouth sores, rhinorrhea, sore throat and trouble swallowing.   Eyes:  Negative for redness and visual disturbance.  Respiratory:  Positive for shortness of breath. Negative for cough and wheezing.   Cardiovascular:  Positive for palpitations. Negative for chest pain and leg swelling.  Gastrointestinal:  Negative for abdominal pain, nausea and vomiting.       No changes in bowel habits.  Endocrine: Negative for cold intolerance, heat intolerance, polydipsia, polyphagia and polyuria.  Genitourinary:  Negative for decreased urine volume, dysuria and hematuria.  Musculoskeletal:  Positive for arthralgias. Negative for gait problem.  Skin:  Positive for color change. Negative for pallor and rash.  Allergic/Immunologic: Positive for environmental allergies.  Neurological:  Positive for numbness. Negative for syncope, weakness and headaches.  Psychiatric/Behavioral:  Negative for confusion and hallucinations.   All other systems reviewed and are negative.  Current Outpatient Medications on File Prior to Visit  Medication Sig Dispense Refill   albuterol  (VENTOLIN  HFA) 108 (90 Base) MCG/ACT inhaler Inhale 2 puffs into the lungs every 6 (six) hours as needed for wheezing or shortness of breath. 8 g 3  aspirin 81 MG tablet Take 81 mg by mouth daily.     calcium  carbonate (TUMS - DOSED IN MG ELEMENTAL CALCIUM ) 500 MG chewable tablet Chew 2 tablets by mouth daily.     Carboxymethylcellulose Sod PF (RETAINE CMC) 0.5 % SOLN Apply to eye.     Cholecalciferol (VITAMIN D3) 1000 UNITS CAPS Take by mouth daily.     co-enzyme Q-10 30 MG capsule Take 50 mg by mouth daily.     FINACEA 15 % cream Apply 1 application topically 2 (two) times daily. For rosacea  0   hydrochlorothiazide  (MICROZIDE ) 12.5 MG capsule  TAKE ONE CAPSULE DAILY 90 capsule 0   hydrocortisone 2.5 % cream Apply topically 2 (two) times daily as needed.     ketoconazole (NIZORAL) 2 % cream SMARTSIG:sparingly Topical Twice Daily     Misc Natural Products (TART CHERRY ADVANCED PO) Take 2,400 mg by mouth daily.     Multiple Vitamins-Minerals (MULTIVITAMIN WITH MINERALS) tablet Take 1 tablet by mouth daily.     OMEGA 3 1000 MG CAPS Take 1,000 mg by mouth daily.     pantoprazole  (PROTONIX ) 40 MG tablet TAKE ONE TABLET DAILY 90 tablet 3   psyllium (METAMUCIL) 58.6 % powder Take 1 packet by mouth daily.     clobetasol (OLUX) 0.05 % topical foam SMARTSIG:sparingly Topical Twice Daily PRN (Patient not taking: Reported on 02/20/2024)     No current facility-administered medications on file prior to visit.   Past Medical History:  Diagnosis Date   Asthma    Breast cancer (HCC) 11/2010   post double mastectomy; Dr Fernand   Diverticulosis    Family history of breast cancer    Fundic gland polyps of stomach, benign 07/2022   GERD (gastroesophageal reflux disease)    S/P dilation X 1   Hemorrhoids    Hypertension    Night sweats    OSA (obstructive sleep apnea) 08/03/2017   Very mild with AHI 7.2/hr.     Prolapsed internal hemorrhoids, grade 3 06/27/2017   Grade 2 on anoscopy exam but grade 3 by history and seen a colonoscopy as well All 3 columns banded 06/27/2017  09/21/2017 banded RP and RA     Reactive airway disease     Past Surgical History:  Procedure Laterality Date   ABDOMINAL HYSTERECTOMY     bilateral breast reconstruction  Jan 2013   BREAST SURGERY  01/13/11   bilateral total mastectomy; Dr Gail   COLONOSCOPY  2014   Dr Obie; hyperplastic polyp   ESOPHAGEAL DILATION  2014   HEMORRHOID BANDING  2019   February and May   NASAL SEPTUM SURGERY     RECONSTRUCTION / CORRECTION OF NIPPLE / MARIJANE  June 2013   THYROGLOSSAL DUCT CYST      No Known Allergies  Family History  Problem Relation Age of Onset   Breast  cancer Mother 37   Colon cancer Mother 60   Cervical cancer Mother 27       s/p hysterectomy   Heart attack Father 26   Diabetes Father        probable pre DM   Skin cancer Father        maybe melanoma   Cancer Sister 23       appendiceal   Heart attack Maternal Grandfather 57   Heart attack Paternal Uncle        X2 ; >50y   Heart attack Maternal Uncle        d. 76  Parkinson's disease Maternal Grandmother        d. 46   Heart attack Paternal Grandfather    Breast cancer Other        unspecified age; maternal great aunt (MGM's sister)   Breast cancer Cousin        maternal 1st cousin, once-removed; unspecified age   Heart attack Paternal Uncle        >50y   Stroke Neg Hx     Social History   Socioeconomic History   Marital status: Married    Spouse name: Not on file   Number of children: Not on file   Years of education: Not on file   Highest education level: Bachelor's degree (e.g., BA, AB, BS)  Occupational History   Not on file  Tobacco Use   Smoking status: Former    Current packs/day: 0.00    Types: Cigarettes    Start date: 05/03/1960    Quit date: 05/03/1965    Years since quitting: 58.8   Smokeless tobacco: Never   Tobacco comments:    smoked 1962-1967, less than 1 ppd  Vaping Use   Vaping status: Never Used  Substance and Sexual Activity   Alcohol use: Yes    Alcohol/week: 14.0 standard drinks of alcohol    Types: 14 Glasses of wine per week    Comment: 2-3 /day   Drug use: No   Sexual activity: Yes  Other Topics Concern   Not on file  Social History Narrative   Married   2-3 glasses wine/day   remote small vol smmoker   Social Drivers of Health   Financial Resource Strain: Low Risk  (02/19/2024)   Overall Financial Resource Strain (CARDIA)    Difficulty of Paying Living Expenses: Not very hard  Food Insecurity: No Food Insecurity (02/19/2024)   Hunger Vital Sign    Worried About Running Out of Food in the Last Year: Never true    Ran Out  of Food in the Last Year: Never true  Transportation Needs: No Transportation Needs (02/19/2024)   PRAPARE - Administrator, Civil Service (Medical): No    Lack of Transportation (Non-Medical): No  Physical Activity: Sufficiently Active (02/19/2024)   Exercise Vital Sign    Days of Exercise per Week: 3 days    Minutes of Exercise per Session: 50 min  Stress: No Stress Concern Present (02/19/2024)   [employer] of Occupational Health - Occupational Stress Questionnaire    Feeling of Stress: Not at all  Social Connections: Socially Integrated (02/19/2024)   Social Connection and Isolation Panel    Frequency of Communication with Friends and Family: Three times a week    Frequency of Social Gatherings with Friends and Family: More than three times a week    Attends Religious Services: 1 to 4 times per year    Active Member of Clubs or Organizations: Yes    Attends Banker Meetings: 1 to 4 times per year    Marital Status: Married   Vitals:   02/20/24 0752  BP: 124/70  Pulse: (!) 56  Temp: 98 F (36.7 C)  SpO2: 97%   Body mass index is 24.7 kg/m.  Wt Readings from Last 3 Encounters:  02/20/24 109 lb 12.8 oz (49.8 kg)  02/07/24 111 lb 14.4 oz (50.8 kg)  04/15/23 108 lb (49 kg)   Physical Exam Vitals and nursing note reviewed.  Constitutional:      General: She is not in acute distress.  Appearance: She is well-developed.  HENT:     Head: Normocephalic and atraumatic.     Right Ear: Tympanic membrane, ear canal and external ear normal.     Left Ear: Tympanic membrane, ear canal and external ear normal.     Mouth/Throat:     Mouth: Mucous membranes are moist.     Pharynx: Oropharynx is clear. Uvula midline.  Eyes:     Conjunctiva/sclera: Conjunctivae normal.     Pupils: Pupils are equal, round, and reactive to light.  Neck:     Thyroid : No thyromegaly (? left thyroid  nodule).     Trachea: No tracheal deviation.  Cardiovascular:     Rate  and Rhythm: Normal rate and regular rhythm.     Heart sounds: No murmur heard.    Comments: PT pulses palpable. Pulmonary:     Effort: Pulmonary effort is normal. No respiratory distress.     Breath sounds: Normal breath sounds.  Abdominal:     Palpations: Abdomen is soft. There is no hepatomegaly or mass.     Tenderness: There is no abdominal tenderness.  Genitourinary:    Comments: No concerns. Musculoskeletal:     Comments: No synovitis appreciated.  Feet:     Comments: Good capillary refill Varicose veins Lymphadenopathy:     Cervical: No cervical adenopathy.  Skin:    General: Skin is warm.     Findings: No erythema or rash.  Neurological:     General: No focal deficit present.     Mental Status: She is alert and oriented to person, place, and time.     Cranial Nerves: No cranial nerve deficit.     Coordination: Coordination normal.     Gait: Gait normal.     Deep Tendon Reflexes:     Reflex Scores:      Bicep reflexes are 2+ on the right side and 2+ on the left side.      Patellar reflexes are 2+ on the right side and 2+ on the left side. Psychiatric:        Mood and Affect: Affect normal. Mood is anxious.   ASSESSMENT AND PLAN: Courtney [name] was here today annual physical examination.  Orders Placed This Encounter  Procedures   US  THYROID    Flu vaccine HIGH DOSE PF(Fluzone Trivalent)   Comprehensive metabolic panel with GFR   Hemoglobin A1c   Lipid panel   TSH   Lab Results  Component Value Date   ALT 26 01/12/2023   AST 25 01/12/2023   ALKPHOS 37 (L) 01/12/2023   BILITOT 1.1 01/12/2023   Lab Results  Component Value Date   NA 140 01/12/2023   CL 104 01/12/2023   K 4.5 01/12/2023   CO2 30 01/12/2023   BUN 21 01/12/2023   CREATININE 0.78 01/12/2023   GFR 72.41 01/12/2023   CALCIUM  10.4 01/12/2023   ALBUMIN 4.5 01/12/2023   GLUCOSE 93 01/12/2023    Lab Results  Component Value Date   WBC 4.7 01/12/2023   HGB 13.4 01/12/2023   HCT 41.1  01/12/2023   MCV 91.6 01/12/2023   PLT 228.0 01/12/2023   Lab Results  Component Value Date   CHOL 164 01/12/2023   HDL 85.20 01/12/2023   LDLCALC 66 01/12/2023   LDLDIRECT 131.3 02/29/2012   TRIG 62.0 01/12/2023   CHOLHDL 2 01/12/2023   Lab Results  Component Value Date   HGBA1C 6.0 01/12/2023   Lab Results  Component Value Date   VITAMINB12 712 01/12/2023  Lab Results  Component Value Date   CRP <1.0 01/12/2023   Routine general medical examination at a health care facility Assessment & Plan: We discussed the importance of regular physical activity and healthy diet for prevention of chronic illness and/or complications. Preventive guidelines reviewed. Vaccination: It seems like she is due for Tdap vaccination.  She mentioned that she is planning to go to [place] in 02/2023, no concerns about this.  Recommend updating vaccination at her pharmacy or to consider arranging appointment at the travel clinic. Ca++ and vit D supplementation to continue. Next CPE in a year.  Primary hypertension Assessment & Plan: BP adequately controlled. Continue losartan  100 mg daily, HCTZ 12.5 mg daily, and amlodipine  5 mg daily. Instructed to monitor BP regularly. Continue low-salt diet.  Prediabetes Assessment & Plan: Hemoglobin A1c was 6.3 in 10/2021. Encouraged a healthy lifestyle for diabetes prevention. Further recommendation will be given according to hemoglobin A1c result.  Orders: -     Hemoglobin A1c; Future  Hyperlipidemia, unspecified hyperlipidemia type Assessment & Plan: Continue rosuvastatin  5 mg daily. Further recommendation will be given according to lipid panel result.  Orders: -     Comprehensive metabolic panel; Future -     Lipid panel; Future  Cyanosis of fingertip We discussed possible etiologies, including vascular disease, rheumatologic disorders among son. For now recommend adequate skin care. Further recommendation will be given according to lab  results.  -     C-reactive protein; Future -     ANA; Future -     CBC; Future  Numbness and tingling -     C-reactive protein; Future -     Vitamin B12; Future -     CBC; Future  Decreased pulses in feet I have difficulty finding distal pulses + associated cyanosis. Further recommendation will be given according to ABI results. Continue appropriate skin care.  -     VAS US  ABI WITH/WO TBI; Future  Need for influenza vaccination -     Flu Vaccine Trivalent High Dose (Fluad)  DOE (dyspnea on exertion) Assessment & Plan: We discussed possible etiologies. She has history of asthma, which could be a contributing factor. She is not having associated CP but reports episodes of palpitations. Cardiology referral placed. Chest x-ray ordered today. Instructed about warning signs.  Orders: -     DG Chest 2 View; Future -     Ambulatory referral to Cardiology  Palpitations This seems to be a chronic problem, more frequent recently. Possible causes discussed. Further recommendation will be given according to lab results. Cardiology referral placed.  -     Ambulatory referral to Cardiology  Gastroesophageal reflux disease, unspecified whether esophagitis present Assessment & Plan: She reports that she is still having episodes of heartburn, usually related with greasy and spicy food. Recommend continuing same dose of pantoprazole . Improve GERD precautions. If symptoms are persistent after changing his diet, we will need to consider GI evaluation.  I spent a total of 61 minutes in both face to face and non face to face activities for this visit on the date of this encounter. During this time history was obtained and documented, examination was performed, prior labs/imaging reviewed, and assessment/plan discussed.  Return in 1 year (on 02/19/2025) for CPE, chronic problems.  I,Rachel Rivera,acting as a scribe for Duriel Deery [name], MD.,have documented all relevant documentation on the  behalf of Amair Shrout [name], MD,as directed by  Tonetta Napoles [name], MD while in the presence of Arinze Rivadeneira [name], MD.  I, Koltan Portocarrero [name], MD,  have reviewed all documentation for this visit. The documentation on 02/20/24 for the exam, diagnosis, procedures, and orders are all accurate and complete.  Dayshon Roback G. [name], MD  [facility]. [place] office.

## 2024-02-23 ENCOUNTER — Ambulatory Visit: Payer: Self-pay | Admitting: Family Medicine

## 2024-02-23 ENCOUNTER — Encounter: Payer: Self-pay | Admitting: Family Medicine

## 2024-02-23 NOTE — Assessment & Plan Note (Signed)
 Problem is not well controlled, having some dysphagia. Continue same dose of Pantoprazole  and GERD precautions. She has an appt with her gastroenterologist.

## 2024-02-23 NOTE — Assessment & Plan Note (Signed)
 She is using Albuterol  inh at night because of cough, no wheezing. Cough seems to be chronic and GERD can be a contributing factor. Continue Symbicort  160-4.5 mcg bid prn. She has not been on Singulair , do not resume.

## 2024-02-23 NOTE — Assessment & Plan Note (Signed)
 We discussed the importance of regular physical activity and healthy diet for prevention of chronic illness and/or complications. Preventive guidelines reviewed. Vaccination up to date. Ca++ and vit D supplementation to continue. Follows with gynecologist annually, had DEXA in 05/2021 normal. Fall precautions. S/P bilateral mastectomy. Next CPE in a year.

## 2024-02-23 NOTE — Assessment & Plan Note (Signed)
 Hemoglobin A1c was 6.0 in 01/2023. Encouraged consistency with a healthy life style for diabetes prevention. Further recommendation will be given according to hemoglobin A1c result.

## 2024-02-23 NOTE — Assessment & Plan Note (Signed)
Continue rosuvastatin 5 mg daily. Further recommendation will be given according to lipid panel result.

## 2024-02-23 NOTE — Assessment & Plan Note (Signed)
 BP adequately controlled. Continue losartan  100 mg daily, HCTZ 12.5 mg daily, and amlodipine  5 mg daily as well as low salt diet. Monitor BP regularly. As far as problem is stable, annual follow ups can be continued.

## 2024-02-29 ENCOUNTER — Ambulatory Visit: Admitting: Internal Medicine

## 2024-02-29 ENCOUNTER — Encounter: Payer: Self-pay | Admitting: Internal Medicine

## 2024-02-29 VITALS — BP 110/66 | HR 56 | Ht <= 58 in | Wt 111.0 lb

## 2024-02-29 DIAGNOSIS — R151 Fecal smearing: Secondary | ICD-10-CM

## 2024-02-29 DIAGNOSIS — J4520 Mild intermittent asthma, uncomplicated: Secondary | ICD-10-CM

## 2024-02-29 DIAGNOSIS — K219 Gastro-esophageal reflux disease without esophagitis: Secondary | ICD-10-CM

## 2024-02-29 DIAGNOSIS — K222 Esophageal obstruction: Secondary | ICD-10-CM

## 2024-02-29 DIAGNOSIS — K641 Second degree hemorrhoids: Secondary | ICD-10-CM | POA: Diagnosis not present

## 2024-02-29 DIAGNOSIS — K449 Diaphragmatic hernia without obstruction or gangrene: Secondary | ICD-10-CM

## 2024-02-29 MED ORDER — FAMOTIDINE 40 MG PO TABS: 40.0000 mg | ORAL_TABLET | Freq: Every day | ORAL | 3 refills | Status: AC

## 2024-02-29 NOTE — Patient Instructions (Addendum)
  VISIT SUMMARY: During your visit, we discussed your ongoing issues with gastroesophageal reflux disease (GERD), esophageal stricture, hiatal hernia, asthma, and hemorrhoids. We reviewed your symptoms and made adjustments to your treatment plan to help manage your conditions more effectively.  YOUR PLAN: GASTROESOPHAGEAL REFLUX DISEASE WITH ESOPHAGEAL STRICTURE AND HIATAL HERNIA: Your reflux symptoms are primarily occurring at night, especially after consuming spicy or fatty foods. You also have a history of esophageal stricture and hiatal hernia, which contribute to your symptoms. -Start taking Pepcid  40 mg at bedtime. -Stop regular use of Tums and albuterol  at night. -Avoid spicy and fatty foods, especially in the evening. -Leave 3-4 hours between eating and going to bed. -Reduce alcohol intake, particularly white wine. -Monitor for worsening dysphagia and consider esophageal dilation if symptoms progress.  ASTHMA: Your asthma symptoms may be triggered by GERD, particularly at night. You are currently using albuterol  before bed, but Symbicort  has been less effective. -Monitor asthma symptoms in relation to GERD management.  INTERNAL AND EXTERNAL HEMORRHOIDS, STATUS POST BANDING: You have difficulty with cleansing and occasional fecal leakage. Previous banding was beneficial so will arrange a follow-up appointment for potential hemorrhoid banding. -Continue proper cleansing techniques and fiber supplementation.  I appreciate the opportunity to care for you. Lupita Commander, MD, [facility]                      Contains text generated by Abridge.                                 Contains text generated by Abridge.

## 2024-02-29 NOTE — Progress Notes (Signed)
 Courtney [name] 78 y.o. 05/03/1946 [id number]  Assessment & Plan:   Encounter Diagnoses  Name Primary?   GERD with stricture Yes   Hiatal hernia    Mild intermittent asthma without complication    Prolapsed internal hemorrhoids, grade 2    Fecal smearing         Gastroesophageal reflux disease with esophageal stricture and hiatal hernia GERD symptoms exacerbated by evening meals, particularly spicy and fatty foods, causing nocturnal symptoms. Current pantoprazole  regimen insufficient.  She is having occasional dysphagia, not severe enough for dilation, she thinks. Hiatal hernia contributes to reflux, but s and we discussed surgical option of hiatal hernia repair, agreed on medical management. - Prescribe Pepcid  40 mg at bedtime. - Advise against regular use of Tums and albuterol  at night. - Recommend avoiding spicy and fatty foods, especially in the evening. - Advise leaving 3-4 hours between eating and going to bed. - Recommend reducing alcohol intake, particularly white wine. - Monitor for worsening dysphagia and consider esophageal dilation if symptoms progress.  Asthma Asthma symptoms triggered by GERD, particularly at night. Current use of albuterol  before bed. Symbicort  less effective. No wheezing, but cough present during reflux episodes. - Advise against regular use of albuterol  at night. - Monitor asthma symptoms in relation to GERD management.  Internal hemorrhoids, status post banding Reports difficulty with cleansing and occasional fecal leakage. Previous banding beneficial. External hemorrhoids present but not protruding. No significant bowel movement issues, maintains regular habits. - Arrange follow-up appointment for potential hemorrhoid banding. - Advise on proper cleansing techniques and fiber supplementation.         Subjective:   Chief Complaint: GERD problems, hemorrhoid issues  HPI Discussed the use of AI scribe software for clinical note  transcription with the patient, who gave verbal consent to proceed.  Courtney [name] is an 78 year old female with GERD, esophageal stricture, and hiatal hernia who presents with reflux and anal pain.  Gastroesophageal reflux symptoms - Reflux symptoms occur primarily at night, especially after consuming spicy or fatty foods in the evening - Pantoprazole  40 mg taken every morning before breakfast - If eating after 7 PM, wakes around 12:30 or 1 AM with reflux symptoms - Manages nocturnal symptoms with four 1000 mg Tums and albuterol , which provide relief - Bed elevated with a motor to alleviate symptoms - Symptoms occur approximately two to three nights per week - Associated nocturnal coughing and phlegm production - No wheezing - Consumes one large mug of coffee daily and two glasses of white wine per day  Dysphagia and esophageal stricture - History of esophageal stricture with esophageal dilation performed last year - Persistent dysphagia, particularly during dinner - Sensation of food lodging in the esophagus, requiring her to cough up and re-chew food - Chews food thoroughly and takes small bites to manage symptoms - Dilation last year provided partial improvement but not complete resolution  Anal discomfort and fecal incontinence - Difficulty with perianal cleansing and occasional fecal leakage, rather than pain - History of hemorrhoids with prior banding procedures - Hemorrhoids no longer protruding as previously - No frequent straining or diarrhea - Bowel movements occur daily, sometimes more than once per day - Uses fiber supplementation        EGD 07/23/2022 esophageal stricture, dilated to 18 mm with balloon, 5 cm hiatal hernia, multiple diminutive polyps-fundic gland polyps  07/23/2022 internal and external hemorrhoids and severe pandiverticulosis  No Known Allergies Current Meds  Medication Sig  albuterol  (VENTOLIN  HFA) 108 (90 Base) MCG/ACT inhaler Inhale 2  puffs into the lungs every 6 (six) hours as needed for wheezing or shortness of breath.   amLODipine  (NORVASC ) 5 MG tablet Take 1 tablet (5 mg total) by mouth daily.   aspirin 81 MG tablet Take 81 mg by mouth daily.   budesonide -formoterol  (SYMBICORT ) 160-4.5 MCG/ACT inhaler INHALE 1 to 2 PUFFS EVERY TWELVE HOURS AS NEEDED. GARGLE AND EXPECTORATE AFTER USE   calcium  carbonate (TUMS - DOSED IN MG ELEMENTAL CALCIUM ) 500 MG chewable tablet Chew 2 tablets by mouth daily.   Carboxymethylcellulose Sod PF (RETAINE CMC) 0.5 % SOLN Apply to eye.   Cholecalciferol (VITAMIN D3) 1000 UNITS CAPS Take by mouth daily.   clobetasol (OLUX) 0.05 % topical foam SMARTSIG:sparingly Topical Twice Daily PRN   co-enzyme Q-10 30 MG capsule Take 50 mg by mouth daily.   famotidine  (PEPCID ) 40 MG tablet Take 1 tablet (40 mg total) by mouth at bedtime.   FINACEA 15 % cream Apply 1 application topically 2 (two) times daily. For rosacea   hydrochlorothiazide  (MICROZIDE ) 12.5 MG capsule TAKE ONE CAPSULE DAILY   hydrocortisone 2.5 % cream Apply topically 2 (two) times daily as needed.   ketoconazole (NIZORAL) 2 % cream SMARTSIG:sparingly Topical Twice Daily   losartan  (COZAAR ) 100 MG tablet TAKE ONE TABLET DAILY   Misc Natural Products (TART CHERRY ADVANCED PO) Take 2,400 mg by mouth daily.   Multiple Vitamins-Minerals (MULTIVITAMIN WITH MINERALS) tablet Take 1 tablet by mouth daily.   OMEGA 3 1000 MG CAPS Take 1,000 mg by mouth daily.   pantoprazole  (PROTONIX ) 40 MG tablet TAKE ONE TABLET DAILY   psyllium (METAMUCIL) 58.6 % powder Take 1 packet by mouth daily.   rosuvastatin  (CRESTOR ) 5 MG tablet TAKE ONE TABLET BY MOUTH EVERY DAY   Past Medical History:  Diagnosis Date   Asthma    Breast cancer (HCC) 11/2010   post double mastectomy; Dr Fernand   Diverticulosis    Family history of breast cancer    Fundic gland polyps of stomach, benign 07/2022   GERD (gastroesophageal reflux disease)    S/P dilation X 1   Hemorrhoids     Hypertension    Night sweats    OSA (obstructive sleep apnea) 08/03/2017   Very mild with AHI 7.2/hr.     Prolapsed internal hemorrhoids, grade 3 06/27/2017   Grade 2 on anoscopy exam but grade 3 by history and seen a colonoscopy as well All 3 columns banded 06/27/2017  09/21/2017 banded RP and RA     Reactive airway disease    Past Surgical History:  Procedure Laterality Date   ABDOMINAL HYSTERECTOMY     bilateral breast reconstruction  Jan 2013   BREAST SURGERY  01/13/11   bilateral total mastectomy; Dr Gail   COLONOSCOPY  2014   Dr Obie; hyperplastic polyp   ESOPHAGEAL DILATION  2014   HEMORRHOID BANDING  2019   February and May   NASAL SEPTUM SURGERY     RECONSTRUCTION / CORRECTION OF NIPPLE / MARIJANE  June 2013   THYROGLOSSAL DUCT CYST     Social History   Social History Narrative   Married   2-3 glasses wine/day   remote small vol smmoker   family history includes Breast cancer in her cousin and another family member; Breast cancer (age of onset: 46) in her mother; Cancer (age of onset: 77) in her sister; Cervical cancer (age of onset: 78) in her mother; Colon cancer (age of  onset: 75) in her mother; Diabetes in her father; Heart attack in her maternal uncle, paternal grandfather, paternal uncle, and paternal uncle; Heart attack (age of onset: 65) in her maternal grandfather; Heart attack (age of onset: 51) in her father; Parkinson's disease in her maternal grandmother; Skin cancer in her father.   Review of Systems As per HPI  Objective:   Physical Exam BP 110/66   Pulse (!) 56   Ht 4' 7.91 (1.42 m)   Wt 111 lb (50.3 kg)   BMI 24.97 kg/m   CHEST: Lungs clear to auscultation with good air movement. Mild kyphosis of the chest cavity. CARDIOVASCULAR: Heart sounds normal, S1, S2, no rubs, murmurs, or gallops. ABDOMEN: Abdomen soft, non-tender, no organomegaly. RECTAL: No visible anal prolapse.       Patti Jordan, CMA present.  Rectal - NL anoderm, sl dec  anal tone with soft brown stool no mass  Anoscopy - Gr 2 RP and LL internal hemorroids   I spent 31 minutes of time, including in depth chart review, independent review of results as outlined above, communicating results with the patient directly, face-to-face time with the patient, coordinating care, ordering studies and medications as appropriate, and documentation.

## 2024-03-05 ENCOUNTER — Ambulatory Visit (HOSPITAL_BASED_OUTPATIENT_CLINIC_OR_DEPARTMENT_OTHER)
Admission: RE | Admit: 2024-03-05 | Discharge: 2024-03-05 | Disposition: A | Discharge status: Home / Self Care | Source: Ambulatory Visit | Attending: Family Medicine | Admitting: Family Medicine

## 2024-03-05 DIAGNOSIS — E049 Nontoxic goiter, unspecified: Secondary | ICD-10-CM | POA: Diagnosis not present

## 2024-03-05 DIAGNOSIS — E041 Nontoxic single thyroid nodule: Secondary | ICD-10-CM | POA: Diagnosis not present

## 2024-03-21 ENCOUNTER — Encounter: Payer: Self-pay | Admitting: Internal Medicine

## 2024-03-21 ENCOUNTER — Ambulatory Visit: Admitting: Internal Medicine

## 2024-03-21 VITALS — BP 126/60 | HR 72 | Ht <= 58 in | Wt 112.8 lb

## 2024-03-21 DIAGNOSIS — K642 Third degree hemorrhoids: Secondary | ICD-10-CM | POA: Diagnosis not present

## 2024-03-21 NOTE — Patient Instructions (Signed)
 HEMORRHOID BANDING PROCEDURE    FOLLOW-UP CARE   The procedure you have had should have been relatively painless since the banding of the area involved does not have nerve endings and there is no pain sensation.  The rubber band cuts off the blood supply to the hemorrhoid and the band may fall off as soon as 48 hours after the banding (the band may occasionally be seen in the toilet bowl following a bowel movement). You may notice a temporary feeling of fullness in the rectum which should respond adequately to plain Tylenol or Motrin.  Following the banding, avoid strenuous exercise that evening and resume full activity the next day.  A sitz bath (soaking in a warm tub) or bidet is soothing, and can be useful for cleansing the area after bowel movements.     To avoid constipation, take two tablespoons of natural wheat bran, natural oat bran, flax, Benefiber or any over the counter fiber supplement and increase your water intake to 7-8 glasses daily.    Unless you have been prescribed anorectal medication, do not put anything inside your rectum for two weeks: No suppositories, enemas, fingers, etc.  Occasionally, you may have more bleeding than usual after the banding procedure.  This is often from the untreated hemorrhoids rather than the treated one.  Don't be concerned if there is a tablespoon or so of blood.  If there is more blood than this, lie flat with your bottom higher than your head and apply an ice pack to the area. If the bleeding does not stop within a half an hour or if you feel faint, call our office at [phone] or go to the emergency room.  Problems are not common; however, if there is a substantial amount of bleeding, severe pain, chills, fever or difficulty passing urine (very rare) or other problems, you should call us  at [phone] or report to the nearest emergency room.  Do not stay seated continuously for more than 2-3 hours for a day or two after the procedure.   Tighten your buttock muscles 10-15 times every two hours and take 10-15 deep breaths every 1-2 hours.  Do not spend more than a few minutes on the toilet if you cannot empty your bowel; instead re-visit the toilet at a later time.   CONTINUE USING YOUR FIBER SUPPLEMENTS.   I appreciate the opportunity to care for you. Lupita Commander, MD, [facility]

## 2024-03-21 NOTE — Progress Notes (Signed)
  HEMORRHOID LIGATION  Signs and symptoms-prolapse  June McMurray, CMA present  PROCEDURE NOTE: The patient presents with symptomatic grade 2  hemorrhoids, requesting rubber band ligation of his/her hemorrhoidal disease.  All risks, benefits and alternative forms of therapy were described and informed consent was obtained.   The anorectum was pre-medicated with 5% lidocaine The decision was made to band the left lateral and right posterior internal hemorrhoid, and the [facility]an System was used to perform band ligation without complication.  Digital anorectal examination was then performed to assure proper positioning of the band, and to adjust the banded tissue as required.  The patient was discharged home without pain or other issues.  Dietary and behavioral recommendations were given and along with follow-up instructions.     The following adjunctive treatments were recommended:  Continue fiber supplementation  The patient will return as needed for  follow-up and possible additional banding as required. No complications were encountered and the patient tolerated the procedure well.

## 2024-05-07 ENCOUNTER — Other Ambulatory Visit: Payer: Self-pay | Admitting: Family Medicine

## 2024-05-07 DIAGNOSIS — I10 Essential (primary) hypertension: Secondary | ICD-10-CM

## 2025-02-07 ENCOUNTER — Encounter: Admitting: Adult Health
